# Patient Record
Sex: Female | Born: 1960 | Race: White | Hispanic: No | Marital: Married | Smoking: Never smoker
Health system: Southern US, Community
[De-identification: ages and names within clinical notes are randomized; demographics above are authoritative.]

## PROBLEM LIST (undated history)

## (undated) DIAGNOSIS — E785 Hyperlipidemia, unspecified: Secondary | ICD-10-CM

## (undated) DIAGNOSIS — M8580 Other specified disorders of bone density and structure, unspecified site: Secondary | ICD-10-CM

## (undated) DIAGNOSIS — M1990 Unspecified osteoarthritis, unspecified site: Secondary | ICD-10-CM

## (undated) DIAGNOSIS — F32A Depression, unspecified: Secondary | ICD-10-CM

## (undated) DIAGNOSIS — M21619 Bunion of unspecified foot: Secondary | ICD-10-CM

## (undated) DIAGNOSIS — N952 Postmenopausal atrophic vaginitis: Secondary | ICD-10-CM

## (undated) DIAGNOSIS — C801 Malignant (primary) neoplasm, unspecified: Secondary | ICD-10-CM

## (undated) DIAGNOSIS — U071 COVID-19: Secondary | ICD-10-CM

## (undated) DIAGNOSIS — D518 Other vitamin B12 deficiency anemias: Secondary | ICD-10-CM

## (undated) DIAGNOSIS — I10 Essential (primary) hypertension: Secondary | ICD-10-CM

## (undated) DIAGNOSIS — Z9221 Personal history of antineoplastic chemotherapy: Secondary | ICD-10-CM

## (undated) DIAGNOSIS — C50919 Malignant neoplasm of unspecified site of unspecified female breast: Secondary | ICD-10-CM

## (undated) DIAGNOSIS — Z923 Personal history of irradiation: Secondary | ICD-10-CM

## (undated) DIAGNOSIS — F419 Anxiety disorder, unspecified: Secondary | ICD-10-CM

## (undated) DIAGNOSIS — B029 Zoster without complications: Secondary | ICD-10-CM

## (undated) DIAGNOSIS — F329 Major depressive disorder, single episode, unspecified: Secondary | ICD-10-CM

## (undated) HISTORY — DX: Unspecified osteoarthritis, unspecified site: M19.90

## (undated) HISTORY — DX: Anxiety disorder, unspecified: F41.9

## (undated) HISTORY — PX: TUBAL LIGATION: SHX77

## (undated) HISTORY — DX: Other specified disorders of bone density and structure, unspecified site: M85.80

## (undated) HISTORY — DX: Postmenopausal atrophic vaginitis: N95.2

## (undated) HISTORY — DX: Hyperlipidemia, unspecified: E78.5

## (undated) HISTORY — PX: WISDOM TOOTH EXTRACTION: SHX21

## (undated) HISTORY — DX: Zoster without complications: B02.9

## (undated) HISTORY — PX: OTHER SURGICAL HISTORY: SHX169

## (undated) HISTORY — DX: Essential (primary) hypertension: I10

## (undated) HISTORY — DX: COVID-19: U07.1

## (undated) HISTORY — PX: PORTA CATH INSERTION: CATH118285

## (undated) HISTORY — DX: Bunion of unspecified foot: M21.619

## (undated) HISTORY — PX: ABDOMINAL HYSTERECTOMY: SHX81

## (undated) HISTORY — DX: Malignant (primary) neoplasm, unspecified: C80.1

## (undated) HISTORY — DX: Depression, unspecified: F32.A

## (undated) HISTORY — DX: Other vitamin B12 deficiency anemias: D51.8

## (undated) HISTORY — PX: COLONOSCOPY: SHX174

## (undated) HISTORY — PX: BUNIONECTOMY: SHX129

## (undated) HISTORY — DX: Major depressive disorder, single episode, unspecified: F32.9

---

## 2000-09-24 HISTORY — PX: BREAST BIOPSY: SHX20

## 2004-09-19 ENCOUNTER — Ambulatory Visit: Payer: Self-pay | Admitting: Occupational Therapy

## 2005-02-01 ENCOUNTER — Ambulatory Visit: Payer: Self-pay | Admitting: Occupational Therapy

## 2005-02-20 ENCOUNTER — Ambulatory Visit: Payer: Self-pay | Admitting: Podiatry

## 2005-05-08 ENCOUNTER — Ambulatory Visit: Payer: Self-pay | Admitting: Occupational Therapy

## 2005-08-31 ENCOUNTER — Ambulatory Visit: Payer: Self-pay | Admitting: Podiatry

## 2006-03-11 ENCOUNTER — Ambulatory Visit: Payer: Self-pay | Admitting: Nurse Practitioner

## 2006-08-06 ENCOUNTER — Other Ambulatory Visit: Payer: Self-pay

## 2006-08-12 ENCOUNTER — Ambulatory Visit: Payer: Self-pay | Admitting: Obstetrics and Gynecology

## 2007-04-24 ENCOUNTER — Ambulatory Visit: Payer: Self-pay | Admitting: Nurse Practitioner

## 2008-06-15 ENCOUNTER — Ambulatory Visit: Payer: Self-pay | Admitting: Obstetrics and Gynecology

## 2009-08-04 ENCOUNTER — Ambulatory Visit: Payer: Self-pay | Admitting: Nurse Practitioner

## 2009-09-01 LAB — HM COLONOSCOPY: HM COLON: NORMAL

## 2010-01-09 HISTORY — PX: GANGLION CYST EXCISION: SHX1691

## 2010-08-09 ENCOUNTER — Ambulatory Visit: Payer: Self-pay | Admitting: Internal Medicine

## 2011-06-04 ENCOUNTER — Ambulatory Visit: Payer: Self-pay | Admitting: Internal Medicine

## 2011-06-07 ENCOUNTER — Encounter: Payer: Self-pay | Admitting: Internal Medicine

## 2011-06-07 ENCOUNTER — Ambulatory Visit (INDEPENDENT_AMBULATORY_CARE_PROVIDER_SITE_OTHER): Payer: BC Managed Care – PPO | Admitting: Internal Medicine

## 2011-06-07 VITALS — BP 169/101 | HR 66 | Temp 97.9°F | Resp 12 | Ht 65.0 in | Wt 156.0 lb

## 2011-06-07 DIAGNOSIS — E538 Deficiency of other specified B group vitamins: Secondary | ICD-10-CM | POA: Insufficient documentation

## 2011-06-07 DIAGNOSIS — F418 Other specified anxiety disorders: Secondary | ICD-10-CM | POA: Insufficient documentation

## 2011-06-07 DIAGNOSIS — F3289 Other specified depressive episodes: Secondary | ICD-10-CM

## 2011-06-07 DIAGNOSIS — F329 Major depressive disorder, single episode, unspecified: Secondary | ICD-10-CM

## 2011-06-07 DIAGNOSIS — F39 Unspecified mood [affective] disorder: Secondary | ICD-10-CM | POA: Insufficient documentation

## 2011-06-07 DIAGNOSIS — I10 Essential (primary) hypertension: Secondary | ICD-10-CM | POA: Insufficient documentation

## 2011-06-07 DIAGNOSIS — F32A Depression, unspecified: Secondary | ICD-10-CM

## 2011-06-07 MED ORDER — CYANOCOBALAMIN 1000 MCG/ML IJ SOLN: 1000.0000 ug | INTRAMUSCULAR | Status: DC

## 2011-06-07 MED ORDER — HYDROCHLOROTHIAZIDE 25 MG PO TABS: 25.0000 mg | ORAL_TABLET | Freq: Every day | ORAL | Status: DC

## 2011-06-07 MED ORDER — VENLAFAXINE HCL ER 37.5 MG PO CP24: 37.5000 mg | ORAL_CAPSULE | Freq: Every day | ORAL | Status: DC

## 2011-06-07 NOTE — Progress Notes (Signed)
Subjective:    Patient ID: Danielle Sparks, female    DOB: Apr 22, 1961, 50 y.o.   MRN: [id number]  HPI Danielle Sparks is a 50 year old female with a history of depression and hypertension who presents for followup. In regards to her depression, Danielle Sparks reports her mood has been much better since starting on Effexor. Danielle Sparks also reports significantly improved sleep after starting on Premarin to help with her hot flashes. Danielle Sparks reports Danielle Sparks feels the best that Danielle Sparks has felt in years. Danielle Sparks has been active dissipating in activities with friends. Danielle Sparks reports her stress level at work has improved. Danielle Sparks denies any complications from her medications.  In regards to her hypertension, Danielle Sparks reports her blood pressure has been well-controlled. 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 Danielle Sparks is recently been out of her blood pressure medication and did not take her medication today. Danielle Sparks denies any chest pain, palpitations, headache, edema, or other symptoms.  Outpatient Encounter Prescriptions as of 06/07/2011  Medication Sig Dispense Refill  . cyanocobalamin (,VITAMIN B-12,) 1000 MCG/ML injection Inject 1 mL (1,000 mcg total) into the muscle every 30 (thirty) days.  1 mL  11  . hydrochlorothiazide (HYDRODIURIL) 25 MG tablet Take 1 tablet (25 mg total) by mouth daily.  90 tablet  4  . PREMARIN 0.625 MG tablet       . venlafaxine (EFFEXOR-XR) 37.5 MG 24 hr capsule Take 1 capsule (37.5 mg total) by mouth daily.  30 capsule  11     Review of Systems  Constitutional: Negative for fever, chills, appetite change, fatigue and unexpected weight change.  Respiratory: Negative for shortness of breath.   Cardiovascular: Negative for chest pain, palpitations and leg swelling.  Musculoskeletal: Negative for myalgias, arthralgias and gait problem.  Skin: Negative for color change.  Psychiatric/Behavioral: Negative for suicidal ideas, sleep disturbance and dysphoric mood. The patient is not nervous/anxious.      BP 169/101  Pulse 66  Temp(Src) 97.9 F (36.6 C) (Oral)  Resp 12  Ht 5\' 5"  (1.651 m)  Wt 156 lb (70.761 kg)  BMI 25.96 kg/m2  SpO2 98%    Objective:   Physical Exam  Constitutional: Danielle Sparks is oriented to person, place, and time. Danielle Sparks appears well-developed and well-nourished. No distress.  HENT:  Head: Normocephalic and atraumatic.  Right Ear: External ear normal.  Left Ear: External ear normal.  Nose: Nose normal.  Mouth/Throat: Oropharynx is clear and moist. No oropharyngeal exudate.  Eyes: Conjunctivae are normal. Pupils are equal, round, and reactive to light. Right eye exhibits no discharge. Left eye exhibits no discharge. No scleral icterus.  Neck: Normal range of motion. Neck supple. No tracheal deviation present. No thyromegaly present.  Cardiovascular: Normal rate, regular rhythm, normal heart sounds and intact distal pulses.  Exam reveals no gallop and no friction rub.   No murmur heard. Pulmonary/Chest: Effort normal and breath sounds normal. No respiratory distress. Danielle Sparks has no wheezes. Danielle Sparks has no rales. Danielle Sparks exhibits no tenderness.  Musculoskeletal: Normal range of motion. Danielle Sparks exhibits no edema and no tenderness.  Lymphadenopathy:    Danielle Sparks has no cervical adenopathy.  Neurological: Danielle Sparks is alert and oriented to person, place, and time. No cranial nerve deficit. Danielle Sparks exhibits normal muscle tone. Coordination normal.  Skin: Skin is warm and dry. No rash noted. Danielle Sparks is not diaphoretic. No erythema. No pallor.  Psychiatric: Danielle Sparks has a normal mood and affect. Her behavior is normal. Judgment and thought content normal. Cognition and memory are  normal.          Assessment & Plan:  1. Depression - patient with depression. Symptoms currently very well controlled on Effexor. We'll plan to continue this medication. I suspect some of her improvement is secondary to improve sleep after starting Premarin as this helps significantly with hot flashes. Danielle Sparks will plan to followup in 3 months  for her annual physical.  2. Hypertension - patient with hypertension which has historically been well controlled. Blood pressure is elevated today, however, Danielle Sparks has not taken her medication. We will refill her hydrochlorothiazide. Danielle Sparks will monitor her blood pressure at her pharmacy and call if blood pressure vomiting greater than 140/90. Otherwise Danielle Sparks will return to clinic in 3 months for physical exam.

## 2011-06-07 NOTE — Patient Instructions (Signed)
Continue current medications. Physical exam in January.

## 2011-07-05 ENCOUNTER — Encounter: Payer: Self-pay | Admitting: Internal Medicine

## 2011-07-13 ENCOUNTER — Ambulatory Visit: Payer: BC Managed Care – PPO

## 2011-07-16 ENCOUNTER — Telehealth: Payer: Self-pay | Admitting: Internal Medicine

## 2011-07-16 MED ORDER — PHENTERMINE HCL 37.5 MG PO CAPS: 37.5000 mg | ORAL_CAPSULE | ORAL | Status: DC

## 2011-07-16 NOTE — Telephone Encounter (Signed)
Yes, she can start Phentermine 37.5mg  daily disp 30 with 2 refill

## 2011-07-16 NOTE — Telephone Encounter (Signed)
Patient called and stated she came by Friday and I took her vitals and she is wondering about starting the Phentermine again.  Please advise.

## 2011-07-16 NOTE — Telephone Encounter (Signed)
Called in RX. Left vm for pt to check w/her pharm

## 2011-08-14 ENCOUNTER — Telehealth: Payer: Self-pay | Admitting: *Deleted

## 2011-08-14 MED ORDER — ESTROGENS CONJUGATED 0.625 MG PO TABS: 0.6250 mg | ORAL_TABLET | Freq: Every day | ORAL | Status: DC

## 2011-08-14 NOTE — Telephone Encounter (Signed)
Done

## 2011-09-19 ENCOUNTER — Ambulatory Visit: Payer: Self-pay | Admitting: Internal Medicine

## 2011-10-09 ENCOUNTER — Ambulatory Visit: Payer: BC Managed Care – PPO | Admitting: Internal Medicine

## 2011-11-19 ENCOUNTER — Other Ambulatory Visit: Payer: Self-pay | Admitting: *Deleted

## 2011-11-19 MED ORDER — ESTROGENS CONJUGATED 0.625 MG PO TABS: 0.6250 mg | ORAL_TABLET | Freq: Every day | ORAL | Status: DC

## 2011-11-28 ENCOUNTER — Encounter: Payer: Self-pay | Admitting: Internal Medicine

## 2012-02-08 ENCOUNTER — Telehealth: Payer: Self-pay | Admitting: Internal Medicine

## 2012-02-08 MED ORDER — PHENTERMINE HCL 37.5 MG PO CAPS: 37.5000 mg | ORAL_CAPSULE | ORAL | Status: DC

## 2012-02-08 NOTE — Telephone Encounter (Signed)
Call [phone] Pt has sent refill for thentermine 37.5mg  She will fax this again Please advise pt Pt is complete out of meds

## 2012-02-08 NOTE — Telephone Encounter (Signed)
We can call in 1 month refill, but she needs 1 month follow up.

## 2012-02-08 NOTE — Telephone Encounter (Signed)
rx sent to pharmacy

## 2012-02-08 NOTE — Telephone Encounter (Signed)
Phentermine request to [facility] [last refill 10.22.12 #[id number]  Last OV 09.13.12]/SLS Please advise.

## 2012-02-26 ENCOUNTER — Encounter: Payer: Self-pay | Admitting: Internal Medicine

## 2012-02-26 ENCOUNTER — Ambulatory Visit (INDEPENDENT_AMBULATORY_CARE_PROVIDER_SITE_OTHER): Payer: BC Managed Care – PPO | Admitting: Internal Medicine

## 2012-02-26 VITALS — BP 130/98 | HR 65 | Temp 98.7°F | Ht 65.0 in | Wt 157.2 lb

## 2012-02-26 DIAGNOSIS — F32A Depression, unspecified: Secondary | ICD-10-CM

## 2012-02-26 DIAGNOSIS — I10 Essential (primary) hypertension: Secondary | ICD-10-CM

## 2012-02-26 DIAGNOSIS — F3289 Other specified depressive episodes: Secondary | ICD-10-CM

## 2012-02-26 DIAGNOSIS — F329 Major depressive disorder, single episode, unspecified: Secondary | ICD-10-CM

## 2012-02-26 MED ORDER — VENLAFAXINE HCL ER 75 MG PO CP24: 75.0000 mg | ORAL_CAPSULE | Freq: Every day | ORAL | Status: DC

## 2012-02-26 NOTE — Assessment & Plan Note (Signed)
Symptoms have recently worsened significantly with increased stressors at home and work. Will increase Effexor to 75 mg daily. We'll also set up counseling with psychology. Patient will call or e-mail with update later this week. She will followup in 2 weeks.

## 2012-02-26 NOTE — Assessment & Plan Note (Signed)
Blood pressure slightly elevated today, however suspect this is related to increased anxiety. Will recheck at visit in 2 weeks.
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
## 2012-02-27 ENCOUNTER — Encounter: Payer: Self-pay | Admitting: Internal Medicine

## 2012-02-27 ENCOUNTER — Telehealth: Payer: Self-pay | Admitting: Internal Medicine

## 2012-02-27 MED ORDER — ESTROGENS CONJUGATED 0.625 MG PO TABS: 0.6250 mg | ORAL_TABLET | Freq: Every day | ORAL | Status: DC

## 2012-02-27 NOTE — Telephone Encounter (Signed)
Message copied by [employer] on Wed Feb 27, 2012 12:51 PM ------      Message from: Ronna Polio A      Created: Tue Feb 26, 2012  1:50 PM       Call Silverio Lay for followup

## 2012-02-27 NOTE — Telephone Encounter (Signed)
Left message on phone to check on pt.

## 2012-03-03 ENCOUNTER — Encounter: Payer: Self-pay | Admitting: Internal Medicine

## 2012-03-11 ENCOUNTER — Telehealth: Payer: Self-pay | Admitting: Internal Medicine

## 2012-03-11 NOTE — Telephone Encounter (Signed)
Error pt will come in on Thursday 6/20

## 2012-03-12 ENCOUNTER — Ambulatory Visit: Payer: BC Managed Care – PPO | Admitting: Internal Medicine

## 2012-03-12 ENCOUNTER — Encounter: Payer: Self-pay | Admitting: Internal Medicine

## 2012-03-13 ENCOUNTER — Encounter: Payer: Self-pay | Admitting: Internal Medicine

## 2012-03-13 ENCOUNTER — Ambulatory Visit (INDEPENDENT_AMBULATORY_CARE_PROVIDER_SITE_OTHER): Payer: BC Managed Care – PPO | Admitting: Internal Medicine

## 2012-03-13 VITALS — BP 120/80 | HR 66 | Temp 98.5°F | Ht 65.0 in | Wt 161.0 lb

## 2012-03-13 DIAGNOSIS — F3289 Other specified depressive episodes: Secondary | ICD-10-CM

## 2012-03-13 DIAGNOSIS — F32A Depression, unspecified: Secondary | ICD-10-CM

## 2012-03-13 DIAGNOSIS — F329 Major depressive disorder, single episode, unspecified: Secondary | ICD-10-CM

## 2012-03-13 MED ORDER — VENLAFAXINE HCL ER 150 MG PO CP24: 150.0000 mg | ORAL_CAPSULE | Freq: Every day | ORAL | Status: DC

## 2012-03-13 NOTE — Assessment & Plan Note (Signed)
Significant improvement in symptoms with use of Effexor and counseling. Will increase dose of Effexor to 150 mg daily. We'll continue with counseling. Patient will followup in one month.

## 2012-03-13 NOTE — Progress Notes (Signed)
  Subjective:    Patient ID: Danielle Sparks, female    DOB: [birth year], 51 y.o.   MRN: [id number]  HPI 51 year old female with history of depression, hypertension presents for followup. At her last visit, she had significant worsening of her depression and was started on Effexor. She was also referred for counseling. She reports significant improvement in her symptoms of depressed mood and anxiety since starting medication and starting counseling. She is currently undergoing counseling a weekly basis. She denies any side effects noted from her medication. She continues to have some occasional episodes of severe anxiety or depressed mood. She notes, however, that she has been making effort to be more active and has restarted an exercise program. She has also been spending time with friends and family.  Outpatient Encounter Prescriptions as of 03/13/2012  Medication Sig Dispense Refill  . cyanocobalamin (,VITAMIN B-12,) 1000 MCG/ML injection Inject 1 mL (1,000 mcg total) into the muscle every 30 (thirty) days.  1 mL  11  . estrogens, conjugated, (PREMARIN) 0.625 MG tablet Take 1 tablet (0.625 mg total) by mouth daily.  90 tablet  3  . hydrochlorothiazide (HYDRODIURIL) 25 MG tablet Take 1 tablet (25 mg total) by mouth daily.  90 tablet  4  . phentermine 37.5 MG capsule Take 1 capsule (37.5 mg total) by mouth every morning.  30 capsule  2  . venlafaxine XR (EFFEXOR-XR) 150 MG 24 hr capsule Take 1 capsule (150 mg total) by mouth daily.  30 capsule  11  . DISCONTD: venlafaxine XR (EFFEXOR-XR) 75 MG 24 hr capsule Take 1 capsule (75 mg total) by mouth daily.  30 capsule  11    Review of Systems  Constitutional: Negative for fever, chills and fatigue.  Psychiatric/Behavioral: Positive for dysphoric mood. Negative for suicidal ideas, hallucinations, disturbed wake/sleep cycle and self-injury. The patient is nervous/anxious.    BP 120/80  Pulse 66  Temp 98.5 F (36.9 C) (Oral)  Ht 5\' 5"  (1.651 m)  Wt  161 lb (73.029 kg)  BMI 26.79 kg/m2  SpO2 98%     Objective:   Physical Exam  Constitutional: She is oriented to person, place, and time. She appears well-developed and well-nourished. No distress.  HENT:  Head: Normocephalic and atraumatic.  Eyes: Pupils are equal, round, and reactive to light.  Neck: Normal range of motion. Neck supple.  Pulmonary/Chest: Effort normal.  Neurological: She is alert and oriented to person, place, and time.  Skin: Skin is warm and dry. She is not diaphoretic.  Psychiatric: She has a normal mood and affect. Her speech is normal and behavior is normal. Thought content normal. Cognition and memory are normal.          Assessment & Plan:

## 2012-04-02 ENCOUNTER — Encounter: Payer: BC Managed Care – PPO | Admitting: Internal Medicine

## 2012-05-02 LAB — HM PAP SMEAR: HM Pap smear: NORMAL

## 2012-05-06 ENCOUNTER — Other Ambulatory Visit (INDEPENDENT_AMBULATORY_CARE_PROVIDER_SITE_OTHER): Payer: BC Managed Care – PPO | Admitting: *Deleted

## 2012-05-06 ENCOUNTER — Telehealth: Payer: Self-pay | Admitting: *Deleted

## 2012-05-06 DIAGNOSIS — Z0000 Encounter for general adult medical examination without abnormal findings: Secondary | ICD-10-CM

## 2012-05-06 LAB — CBC WITH DIFFERENTIAL/PLATELET
Eosinophils Relative: 3 % (ref 0.0–5.0)
HCT: 41.3 % (ref 36.0–46.0)
Hemoglobin: 13.7 g/dL (ref 12.0–15.0)
Lymphs Abs: 1.5 10*3/uL (ref 0.7–4.0)
Monocytes Relative: 7.8 % (ref 3.0–12.0)
Neutro Abs: 2.3 10*3/uL (ref 1.4–7.7)
RDW: 13.2 % (ref 11.5–14.6)
WBC: 4.2 10*3/uL — ABNORMAL LOW (ref 4.5–10.5)

## 2012-05-06 LAB — COMPREHENSIVE METABOLIC PANEL
BUN: 13 mg/dL (ref 6–23)
CO2: 27 mEq/L (ref 19–32)
Creatinine, Ser: 0.6 mg/dL (ref 0.4–1.2)
GFR: 106.94 mL/min (ref >60.00)
Glucose, Bld: 87 mg/dL (ref 70–99)
Total Bilirubin: 0.6 mg/dL (ref 0.3–1.2)

## 2012-05-06 LAB — LIPID PANEL
Cholesterol: 267 mg/dL — ABNORMAL HIGH (ref 0–200)
HDL: 76.6 mg/dL (ref >39.00)
VLDL: 19.4 mg/dL (ref 0.0–40.0)

## 2012-05-06 NOTE — Telephone Encounter (Signed)
Patient came in for labs today , what would you like for me to order? 

## 2012-05-06 NOTE — Telephone Encounter (Signed)
CBC, CMP, lipids, TSH, A1c, Vit D 

## 2012-05-06 NOTE — Telephone Encounter (Signed)
Labs ordered.

## 2012-05-07 LAB — VITAMIN D 25 HYDROXY (VIT D DEFICIENCY, FRACTURES): Vit D, 25-Hydroxy: 42 ng/mL (ref 30–89)

## 2012-05-13 ENCOUNTER — Ambulatory Visit (INDEPENDENT_AMBULATORY_CARE_PROVIDER_SITE_OTHER): Payer: BC Managed Care – PPO | Admitting: Internal Medicine

## 2012-05-13 ENCOUNTER — Encounter: Payer: Self-pay | Admitting: Internal Medicine

## 2012-05-13 ENCOUNTER — Other Ambulatory Visit (HOSPITAL_COMMUNITY)
Admission: RE | Admit: 2012-05-13 | Discharge: 2012-05-13 | Disposition: A | Discharge status: Home / Self Care | Payer: BC Managed Care – PPO | Source: Ambulatory Visit | Attending: Internal Medicine | Admitting: Internal Medicine

## 2012-05-13 VITALS — BP 130/94 | HR 65 | Temp 98.2°F | Ht 65.0 in | Wt 165.0 lb

## 2012-05-13 DIAGNOSIS — E785 Hyperlipidemia, unspecified: Secondary | ICD-10-CM

## 2012-05-13 DIAGNOSIS — F3289 Other specified depressive episodes: Secondary | ICD-10-CM

## 2012-05-13 DIAGNOSIS — D72819 Decreased white blood cell count, unspecified: Secondary | ICD-10-CM | POA: Insufficient documentation

## 2012-05-13 DIAGNOSIS — N959 Unspecified menopausal and perimenopausal disorder: Secondary | ICD-10-CM

## 2012-05-13 DIAGNOSIS — F32A Depression, unspecified: Secondary | ICD-10-CM

## 2012-05-13 DIAGNOSIS — Z1151 Encounter for screening for human papillomavirus (HPV): Secondary | ICD-10-CM | POA: Insufficient documentation

## 2012-05-13 DIAGNOSIS — E7849 Other hyperlipidemia: Secondary | ICD-10-CM | POA: Insufficient documentation

## 2012-05-13 DIAGNOSIS — Z01419 Encounter for gynecological examination (general) (routine) without abnormal findings: Secondary | ICD-10-CM | POA: Insufficient documentation

## 2012-05-13 DIAGNOSIS — Z23 Encounter for immunization: Secondary | ICD-10-CM

## 2012-05-13 DIAGNOSIS — F329 Major depressive disorder, single episode, unspecified: Secondary | ICD-10-CM

## 2012-05-13 DIAGNOSIS — Z1211 Encounter for screening for malignant neoplasm of colon: Secondary | ICD-10-CM

## 2012-05-13 DIAGNOSIS — Z0000 Encounter for general adult medical examination without abnormal findings: Secondary | ICD-10-CM

## 2012-05-13 HISTORY — DX: Decreased white blood cell count, unspecified: D72.819

## 2012-05-13 MED ORDER — ESTROGENS CONJUGATED 0.45 MG PO TABS: 0.4500 mg | ORAL_TABLET | Freq: Every day | ORAL | Status: DC

## 2012-05-13 NOTE — Assessment & Plan Note (Signed)
LDL elevated on recent labs. Discussed limiting intake of saturated fat and processed carbohydrates. Discussed increasing intake of fiber. And increasing physical activity. Will plan to repeat lipids in 3 months. If no improvement, will add statin.

## 2012-05-13 NOTE — Assessment & Plan Note (Signed)
Mild leukopenia noted on recent labs. We'll plan to repeat CBC in 3 months.

## 2012-05-13 NOTE — Assessment & Plan Note (Signed)
Symptoms markedly improved with counseling and Effexor. Will continue.

## 2012-05-13 NOTE — Progress Notes (Signed)
Subjective:    Patient ID: Danielle Sparks, female    DOB: 03-21-1961, 51 y.o.   MRN: [id number]  HPI 51 year old female with history of hypertension and depression/anxiety presents for a annual exam. She reports she is doing very well. She reports that counseling has significantly improved her anxiety and depressed mood. She also is taking Effexor and reports this is working well for her.  We reviewed recent labs which showed elevated cholesterol with LDL of 186. She has been noted to have elevated cholesterol the past but would prefer not to use medication for this. She reports some dietary indiscretion recently. She does exercise on a regular basis.  In regards to general health maintenance, she is due for colonoscopy. She is also due for Pap smear. She has not had a Pap smear since her hysterectomy. She reports that hysterectomy was performed for uterine fibroids. She has never had abnormal Paps in the past. She is also due for TDAP Vaccine. She notes that her daughter is pregnant she would like to have this vaccine prior to arrival of her grandchild.  Outpatient Encounter Prescriptions as of 05/13/2012  Medication Sig Dispense Refill  . cyanocobalamin (,VITAMIN B-12,) 1000 MCG/ML injection Inject 1 mL (1,000 mcg total) into the muscle every 30 (thirty) days.  1 mL  11  . estrogens, conjugated, (PREMARIN) 0.45 MG tablet Take 1 tablet (0.45 mg total) by mouth daily.  90 tablet  3  . hydrochlorothiazide (HYDRODIURIL) 25 MG tablet Take 1 tablet (25 mg total) by mouth daily.  90 tablet  4  . venlafaxine XR (EFFEXOR-XR) 150 MG 24 hr capsule Take 1 capsule (150 mg total) by mouth daily.  30 capsule  11   BP 130/94  Pulse 65  Temp 98.2 F (36.8 C) (Oral)  Ht 5\' 5"  (1.651 m)  Wt 165 lb (74.844 kg)  BMI 27.46 kg/m2  SpO2 97%  Review of Systems  Constitutional: Negative for fever, chills, appetite change, fatigue and unexpected weight change.  HENT: Negative for ear pain, congestion, sore  throat, trouble swallowing, neck pain, voice change and sinus pressure.   Eyes: Negative for visual disturbance.  Respiratory: Negative for cough, shortness of breath, wheezing and stridor.   Cardiovascular: Negative for chest pain, palpitations and leg swelling.  Gastrointestinal: Negative for nausea, vomiting, abdominal pain, diarrhea, constipation, blood in stool, abdominal distention and anal bleeding.  Genitourinary: Negative for dysuria and flank pain.  Musculoskeletal: Negative for myalgias, arthralgias and gait problem.  Skin: Negative for color change and rash.  Neurological: Negative for dizziness and headaches.  Hematological: Negative for adenopathy. Does not bruise/bleed easily.  Psychiatric/Behavioral: Negative for suicidal ideas, disturbed wake/sleep cycle and dysphoric mood. The patient is nervous/anxious.        Objective:   Physical Exam  Constitutional: She is oriented to person, place, and time. She appears well-developed and well-nourished. No distress.  HENT:  Head: Normocephalic and atraumatic.  Right Ear: External ear normal.  Left Ear: External ear normal.  Nose: Nose normal.  Mouth/Throat: Oropharynx is clear and moist. No oropharyngeal exudate.  Eyes: Conjunctivae are normal. Pupils are equal, round, and reactive to light. Right eye exhibits no discharge. Left eye exhibits no discharge. No scleral icterus.  Neck: Normal range of motion. Neck supple. No tracheal deviation present. No thyromegaly present.  Cardiovascular: Normal rate, regular rhythm, normal heart sounds and intact distal pulses.  Exam reveals no gallop and no friction rub.   No murmur heard. Pulmonary/Chest: Effort normal and breath  sounds normal. No respiratory distress. She has no wheezes. She has no rales. She exhibits no tenderness. Right breast exhibits no inverted nipple, no mass, no nipple discharge, no skin change and no tenderness. Left breast exhibits no inverted nipple, no mass, no  nipple discharge, no skin change and no tenderness. Breasts are symmetrical.  Abdominal: Soft. Bowel sounds are normal. She exhibits no distension and no mass. There is no tenderness. There is no rebound and no guarding.  Genitourinary: Rectum normal and vagina normal. No breast swelling, tenderness, discharge or bleeding. Pelvic exam was performed with patient supine. There is no rash, tenderness or lesion on the right labia. There is no rash, tenderness or lesion on the left labia. Right adnexum displays no mass, no tenderness and no fullness. Left adnexum displays no mass, no tenderness and no fullness. No erythema or tenderness around the vagina. No vaginal discharge found.       Cervix and uterus surgically absent  Musculoskeletal: Normal range of motion. She exhibits no edema and no tenderness.  Lymphadenopathy:    She has no cervical adenopathy.  Neurological: She is alert and oriented to person, place, and time. No cranial nerve deficit. She exhibits normal muscle tone. Coordination normal.  Skin: Skin is warm and dry. No rash noted. She is not diaphoretic. No erythema. No pallor.  Psychiatric: She has a normal mood and affect. Her behavior is normal. Judgment and thought content normal.          Assessment & Plan:

## 2012-05-13 NOTE — Assessment & Plan Note (Signed)
General medical exam including breast and pelvic exam normal today. Pap is pending. Tetanus and pertussis vaccine was given. Reviewed recent lab work which was normal except for elevated cholesterol. Encourage patient to continue efforts at healthy diet and regular physical activity. Will schedule screening colonoscopy.

## 2012-05-13 NOTE — Assessment & Plan Note (Signed)
Patient is interested in weaning down on Premarin. Will decrease dose to 0.45 mg daily. Followup in 3 months.

## 2012-05-13 NOTE — Assessment & Plan Note (Signed)
>>  ASSESSMENT AND PLAN FOR HYPERLIPIDEMIA WITH TARGET LOW DENSITY LIPOPROTEIN (LDL) CHOLESTEROL LESS THAN 100 MG/DL WRITTEN ON 1/79/7986 87:51 PM BY WALKER, JENNIFER A, MD  LDL elevated on recent labs. Discussed limiting intake of saturated fat and processed carbohydrates. Discussed increasing intake of fiber. And increasing physical activity. Will plan to repeat lipids in 3 months. If no improvement, will add statin.

## 2012-05-14 ENCOUNTER — Encounter: Payer: Self-pay | Admitting: Internal Medicine

## 2012-06-16 ENCOUNTER — Encounter: Payer: BC Managed Care – PPO | Admitting: Internal Medicine

## 2012-06-24 ENCOUNTER — Ambulatory Visit (AMBULATORY_SURGERY_CENTER): Payer: BC Managed Care – PPO | Admitting: *Deleted

## 2012-06-24 VITALS — Ht 65.5 in | Wt 170.5 lb

## 2012-06-24 DIAGNOSIS — Z1211 Encounter for screening for malignant neoplasm of colon: Secondary | ICD-10-CM

## 2012-06-24 MED ORDER — NA SULFATE-K SULFATE-MG SULF 17.5-3.13-1.6 GM/177ML PO SOLN: 1.0000 | Freq: Once | ORAL | Status: DC

## 2012-06-25 ENCOUNTER — Encounter: Payer: Self-pay | Admitting: Internal Medicine

## 2012-07-03 ENCOUNTER — Encounter: Payer: Self-pay | Admitting: Internal Medicine

## 2012-07-03 ENCOUNTER — Ambulatory Visit (AMBULATORY_SURGERY_CENTER): Payer: BC Managed Care – PPO | Admitting: Internal Medicine

## 2012-07-03 VITALS — BP 115/103 | HR 61 | Temp 97.2°F | Resp 15 | Ht 65.0 in | Wt 170.0 lb

## 2012-07-03 DIAGNOSIS — D126 Benign neoplasm of colon, unspecified: Secondary | ICD-10-CM

## 2012-07-03 DIAGNOSIS — D129 Benign neoplasm of anus and anal canal: Secondary | ICD-10-CM

## 2012-07-03 DIAGNOSIS — D128 Benign neoplasm of rectum: Secondary | ICD-10-CM

## 2012-07-03 LAB — HM COLONOSCOPY: HM Colonoscopy: NORMAL

## 2012-07-03 NOTE — Patient Instructions (Addendum)

## 2012-07-03 NOTE — Op Note (Signed)
[facility] [address] [place], [address]   COLONOSCOPY PROCEDURE REPORT  PATIENT: Danielle Sparks, Danielle Sparks  MR#: [id number] BIRTHDATE: 01/22/1961 , 51  yrs. old GENDER: Female ENDOSCOPIST: Beverley Fiedler, MD REFERRED [id number], Jennifer PROCEDURE DATE:  07/03/2012 PROCEDURE:   Colonoscopy with cold biopsy polypectomy ASA CLASS:   Class I INDICATIONS:average risk screening and first colonoscopy. MEDICATIONS: MAC sedation, administered by CRNA and propofol (Diprivan) 250mg  IV  DESCRIPTION OF PROCEDURE:   After the risks benefits and alternatives of the procedure were thoroughly explained, informed consent was obtained.  A digital rectal exam revealed no rectal mass.   The LB [id number]  endoscope was introduced through the anus and advanced to the cecum, which was identified by both the appendix and ileocecal valve. No adverse events experienced. The quality of the prep was Suprep good  The instrument was then slowly withdrawn as the colon was fully examined.    COLON FINDINGS: Mild diverticulosis was noted in the sigmoid colon. A diminutive sessile polyp, measuring 2 mm in size, was found in the rectum.  A polypectomy was performed with cold forceps.  The resection was complete and the polyp tissue was completely retrieved.  Retroflexed views revealed no abnormalities. The time to cecum=4 minutes 38 seconds.  Withdrawal time=10 minutes 00 seconds.  The scope was withdrawn and the procedure completed.  COMPLICATIONS: There were no complications.  ENDOSCOPIC IMPRESSION: 1.   Mild diverticulosis was noted in the sigmoid colon 2.   Diminutive sessile polyp, measuring 2 mm in size, was found in the rectum; polypectomy was performed with cold forceps  RECOMMENDATIONS: 1.  Await pathology results 2.  High fiber diet 3.  If the polyp removed today is proven to be an adenomatous (pre-cancerous) polyp, you will need a repeat colonoscopy in 5 years.  Otherwise  you should continue to follow colorectal cancer screening guidelines for "routine risk" patients with colonoscopy in 10 years.  You will receive a letter within 1-2 weeks with the results of your biopsy as well as final recommendations.  Please call my office if you have not received a letter after 3 weeks.   eSigned:  Beverley Fiedler, MD 07/03/2012 12:35 PM   cc: The Patient and Ronna Polio MD

## 2012-07-04 ENCOUNTER — Telehealth: Payer: Self-pay

## 2012-07-04 NOTE — Telephone Encounter (Signed)
Left message on answering machine. 

## 2012-07-09 ENCOUNTER — Encounter: Payer: Self-pay | Admitting: Internal Medicine

## 2012-08-08 ENCOUNTER — Other Ambulatory Visit: Payer: BC Managed Care – PPO

## 2012-08-13 ENCOUNTER — Ambulatory Visit: Payer: BC Managed Care – PPO | Admitting: Internal Medicine

## 2012-08-13 ENCOUNTER — Other Ambulatory Visit: Payer: Self-pay | Admitting: Internal Medicine

## 2012-08-14 ENCOUNTER — Encounter: Payer: Self-pay | Admitting: General Practice

## 2012-08-14 ENCOUNTER — Telehealth: Payer: Self-pay | Admitting: General Practice

## 2012-08-14 DIAGNOSIS — E785 Hyperlipidemia, unspecified: Secondary | ICD-10-CM

## 2012-08-14 MED ORDER — PHENTERMINE HCL 37.5 MG PO CAPS: 37.5000 mg | ORAL_CAPSULE | ORAL | Status: DC

## 2012-08-14 NOTE — Telephone Encounter (Signed)
Phentermine refill request. Last filled on 02/08/12 qty 30 with 2 refills. Pt last seen on 8/20. Ok to refill?

## 2012-08-14 NOTE — Telephone Encounter (Signed)
Pt called in regards to recent medication denial. Tried to send a myChart message but for some reason her account has been deactivated.  Cholesterol high 3 mos ago pt asked you for 3 mos to get this in order to see if she could get levels down on her own. Pt could not come for blood work because of an employee problem at work, that is why she did not come for appt yesterday. Pt had one refill left of Phentermine. Pt has lost 15 lbs. Started taking 1/2 of one at in the morning and 1/2 of one at lunch time. Bp is doing well also.Pt states she has energy. Could she continue on it through the holidays. Seeing Dr. Latina Craver every 6 weeks.

## 2012-08-14 NOTE — Telephone Encounter (Signed)
Med filled.  

## 2012-08-14 NOTE — Telephone Encounter (Signed)
We can give 1 month supply of Phentermine with no refills.  FDA approves phentermine for use for 3 months only. She will need to have follow up in 1-2 months to recheck BP on medication.

## 2012-09-01 ENCOUNTER — Other Ambulatory Visit: Payer: Self-pay | Admitting: Internal Medicine

## 2012-09-01 ENCOUNTER — Ambulatory Visit: Payer: BC Managed Care – PPO | Admitting: Internal Medicine

## 2012-09-01 NOTE — Telephone Encounter (Signed)
Med filled.  

## 2012-09-03 ENCOUNTER — Ambulatory Visit: Payer: BC Managed Care – PPO | Admitting: Internal Medicine

## 2012-09-24 DIAGNOSIS — C50919 Malignant neoplasm of unspecified site of unspecified female breast: Secondary | ICD-10-CM

## 2012-09-24 HISTORY — DX: Malignant neoplasm of unspecified site of unspecified female breast: C50.919

## 2012-09-24 HISTORY — PX: BREAST EXCISIONAL BIOPSY: SUR124

## 2012-10-02 ENCOUNTER — Ambulatory Visit: Payer: Self-pay | Admitting: Internal Medicine

## 2012-10-03 ENCOUNTER — Telehealth: Payer: Self-pay | Admitting: Internal Medicine

## 2012-10-03 NOTE — Telephone Encounter (Signed)
Pt mammogram was BIRADS 3, recommending earlier follow up. Typically this is 6 months, however they did not say on the repeat. Have they contacted pt to schedule repeat mammogram?

## 2012-10-08 ENCOUNTER — Telehealth: Payer: Self-pay | Admitting: Internal Medicine

## 2012-10-08 NOTE — Telephone Encounter (Signed)
I have received a mammogram report which says BIRADS 3 interval follow up needed, but does not specify amount of time, then at the end of the report it says BIRADS0, needs additional imaging?? Can you ask pt if the radiologist talked to her about this? Did they set up follow up?

## 2012-10-09 NOTE — Telephone Encounter (Signed)
See phone note from 10/08/2012, patient returns for additional views on Monday.

## 2012-10-09 NOTE — Telephone Encounter (Signed)
Spoke with patient and she stated she returns to the breast center on Monday for additional testing.

## 2012-10-14 ENCOUNTER — Encounter: Payer: Self-pay | Admitting: Internal Medicine

## 2012-10-15 ENCOUNTER — Ambulatory Visit: Payer: Self-pay | Admitting: Internal Medicine

## 2012-10-17 ENCOUNTER — Telehealth: Payer: Self-pay | Admitting: Internal Medicine

## 2012-10-17 DIAGNOSIS — R928 Other abnormal and inconclusive findings on diagnostic imaging of breast: Secondary | ICD-10-CM

## 2012-10-17 NOTE — Telephone Encounter (Signed)
I have asked Amber to go ahead and set up the surgical appointment and wait for Dr. Dan Humphreys to advise her that patient is aware that she will call.

## 2012-10-17 NOTE — Telephone Encounter (Signed)
I have received ultrasound report only. Based on this report, there is a subcentimeter nodule in the right breast. I would recommend surgical evaluation with Dr. Lemar Livings.

## 2012-10-17 NOTE — Telephone Encounter (Signed)
I have laid report at your work station.

## 2012-10-17 NOTE — Telephone Encounter (Signed)
Patient called back up at [facility] and she stated that the radiologist called her and said she needed to be set up with a surgeon.  She trust your opinion of who that surgeon should be and will wait to hear from [place]. She did say that they should fax the results over within the hour.

## 2012-10-17 NOTE — Telephone Encounter (Signed)
Patient called back and said that she spoke with [facility] and they stated that they haven't read the results yet.   She was a little upset with them and wanted to know who she should contact since they made it seem like it was urgent for the more images and now the results are sitting on someone's desk waiting to be read.  I advised her to call over and speak with their office manager.  She did say that they should have the results sent here before noon today.

## 2012-10-17 NOTE — Telephone Encounter (Signed)
We have not yet received her mammogram report. Can you: 1. Let pt know this and that we will call her with results and recommendations when we have the report 2. Request mammogram from [facility]

## 2012-10-17 NOTE — Telephone Encounter (Signed)
Patient is calling to get results of the [place] that she had done at [facility] she said that she had this done yesterday and would really like the results before the weekend.

## 2012-10-27 ENCOUNTER — Encounter: Payer: Self-pay | Admitting: Internal Medicine

## 2012-10-30 ENCOUNTER — Encounter: Payer: Self-pay | Admitting: Internal Medicine

## 2012-11-03 ENCOUNTER — Ambulatory Visit: Payer: Self-pay | Admitting: Surgery

## 2012-11-11 ENCOUNTER — Ambulatory Visit: Payer: Self-pay | Admitting: Surgery

## 2012-11-11 LAB — CBC
HCT: 39.2 % (ref 35.0–47.0)
MCH: 31.6 pg (ref 26.0–34.0)
RBC: 4.22 10*6/uL (ref 3.80–5.20)
RDW: 13.8 % (ref 11.5–14.5)
WBC: 4.7 10*3/uL (ref 3.6–11.0)

## 2012-11-11 LAB — BASIC METABOLIC PANEL
Anion Gap: 5 — ABNORMAL LOW (ref 7–16)
BUN: 12 mg/dL (ref 7–18)
Co2: 32 mmol/L (ref 21–32)
Creatinine: 0.56 mg/dL — ABNORMAL LOW (ref 0.60–1.30)
EGFR (African American): >60
EGFR (Non-African Amer.): >60
Glucose: 83 mg/dL (ref 65–99)
Sodium: 137 mmol/L (ref 136–145)

## 2012-11-11 LAB — PATHOLOGY REPORT

## 2012-11-12 ENCOUNTER — Ambulatory Visit: Payer: Self-pay | Admitting: Surgery

## 2012-11-21 ENCOUNTER — Telehealth: Payer: Self-pay

## 2012-11-21 MED ORDER — CLONAZEPAM 0.5 MG PO TABS: 0.5000 mg | ORAL_TABLET | Freq: Three times a day (TID) | ORAL | Status: DC | PRN

## 2012-11-21 NOTE — Telephone Encounter (Signed)
Yes. We can start Clonazepam 0.5mg  po tid prn #90 with 1 refill. She will need to make a follow up appointment.

## 2012-11-21 NOTE — Telephone Encounter (Signed)
Pt was recently diagnosed with Breast Cancer and was wanting to know if she could be per scribed Clonazepam. She was per scribed xanax by her surgeon and they give her extremely bad dreams. Her surgeon suggested that she contact you to see if you could per scribed this for her. She asks for a low dosage. She is needing something to help her chill out a little bit.

## 2012-11-21 NOTE — Telephone Encounter (Signed)
Rx was called in to pharmacy and left message on patient voicemail to call to schedule follow up appointment

## 2012-12-05 ENCOUNTER — Ambulatory Visit: Payer: Self-pay | Admitting: Internal Medicine

## 2012-12-05 ENCOUNTER — Ambulatory Visit: Payer: Self-pay | Admitting: Oncology

## 2012-12-05 LAB — CBC CANCER CENTER
Basophil #: 0 x10 3/mm (ref 0.0–0.1)
Basophil %: 0.6 %
Eosinophil #: 0.1 x10 3/mm (ref 0.0–0.7)
Eosinophil %: 1.6 %
HCT: 40.3 % (ref 35.0–47.0)
HGB: 13.5 g/dL (ref 12.0–16.0)
Lymphocyte #: 1.6 x10 3/mm (ref 1.0–3.6)
Lymphocyte %: 29.6 %
MCH: 31.2 pg (ref 26.0–34.0)
MCHC: 33.6 g/dL (ref 32.0–36.0)
MCV: 93 fL (ref 80–100)
Monocyte #: 0.4 x10 3/mm (ref 0.2–0.9)
Monocyte %: 8.1 %
Neutrophil #: 3.2 x10 3/mm (ref 1.4–6.5)
RBC: 4.34 10*6/uL (ref 3.80–5.20)
RDW: 14 % (ref 11.5–14.5)
WBC: 5.3 x10 3/mm (ref 3.6–11.0)

## 2012-12-05 LAB — COMPREHENSIVE METABOLIC PANEL
Alkaline Phosphatase: 75 U/L (ref 50–136)
Anion Gap: 5 — ABNORMAL LOW (ref 7–16)
BUN: 8 mg/dL (ref 7–18)
Bilirubin,Total: 0.5 mg/dL (ref 0.2–1.0)
Calcium, Total: 9.4 mg/dL (ref 8.5–10.1)
Chloride: 98 mmol/L (ref 98–107)
Co2: 35 mmol/L — ABNORMAL HIGH (ref 21–32)
EGFR (African American): >60
Glucose: 94 mg/dL (ref 65–99)
Osmolality: 274 (ref 275–301)
SGOT(AST): 16 U/L (ref 15–37)
Sodium: 138 mmol/L (ref 136–145)
Total Protein: 7.4 g/dL (ref 6.4–8.2)

## 2012-12-05 LAB — PHOSPHORUS: Phosphorus: 3.8 mg/dL (ref 2.5–4.9)

## 2012-12-05 LAB — MAGNESIUM: Magnesium: 2 mg/dL

## 2012-12-08 ENCOUNTER — Telehealth: Payer: Self-pay | Admitting: Emergency Medicine

## 2012-12-08 ENCOUNTER — Ambulatory Visit: Payer: Self-pay | Admitting: Oncology

## 2012-12-08 DIAGNOSIS — F32A Depression, unspecified: Secondary | ICD-10-CM

## 2012-12-08 DIAGNOSIS — F329 Major depressive disorder, single episode, unspecified: Secondary | ICD-10-CM

## 2012-12-08 NOTE — Telephone Encounter (Signed)
Patient would like to talk with you about her breast cancer issue, she has a PET SCAN and a port put in tomorrow morning. Could you please call her, she is quite anxious. Thanks.

## 2012-12-08 NOTE — Telephone Encounter (Signed)
Called to patient to see if I could help her, per patient she would like to speak directly to Dr. Dan Humphreys and no one else. Also she stated she will be available all, you can call her anytime today. She has to have a port put in tomorrow morning.

## 2012-12-09 ENCOUNTER — Ambulatory Visit: Payer: Self-pay | Admitting: Surgery

## 2012-12-09 NOTE — Telephone Encounter (Signed)
Spoke with pt. Having significant increase in depression with recent breast cancer diagnosis.  Danielle Sparks - Can you see if we can get her into see Dr. Maryruth Bun in psychiatry this week? Can you also set her up here either later this week or next week for follow up. And, can you ask for records from [facility]

## 2012-12-15 LAB — CBC CANCER CENTER
Basophil #: 0 x10 3/mm (ref 0.0–0.1)
Basophil %: 0.7 %
Eosinophil #: 0.1 x10 3/mm (ref 0.0–0.7)
Eosinophil %: 1.8 %
HCT: 38.3 % (ref 35.0–47.0)
HGB: 13.3 g/dL (ref 12.0–16.0)
Lymphocyte %: 32.2 %
MCH: 32.1 pg (ref 26.0–34.0)
MCHC: 34.8 g/dL (ref 32.0–36.0)
MCV: 92 fL (ref 80–100)
Monocyte #: 0.4 x10 3/mm (ref 0.2–0.9)
Monocyte %: 9.3 %
Neutrophil #: 2.4 x10 3/mm (ref 1.4–6.5)
Neutrophil %: 56 %
Platelet: 208 x10 3/mm (ref 150–440)
RDW: 13.7 % (ref 11.5–14.5)
WBC: 4.3 x10 3/mm (ref 3.6–11.0)

## 2012-12-15 LAB — COMPREHENSIVE METABOLIC PANEL
Alkaline Phosphatase: 80 U/L (ref 50–136)
Bilirubin,Total: 0.7 mg/dL (ref 0.2–1.0)
Co2: 34 mmol/L — ABNORMAL HIGH (ref 21–32)
EGFR (Non-African Amer.): >60
Osmolality: 273 (ref 275–301)
Potassium: 4.8 mmol/L (ref 3.5–5.1)
SGOT(AST): 27 U/L (ref 15–37)
SGPT (ALT): 51 U/L (ref 12–78)
Sodium: 137 mmol/L (ref 136–145)
Total Protein: 7.4 g/dL (ref 6.4–8.2)

## 2012-12-17 ENCOUNTER — Ambulatory Visit: Payer: BC Managed Care – PPO | Admitting: Internal Medicine

## 2012-12-17 ENCOUNTER — Telehealth: Payer: Self-pay | Admitting: Internal Medicine

## 2012-12-17 NOTE — Telephone Encounter (Signed)
Left message to call back  

## 2012-12-17 NOTE — Telephone Encounter (Signed)
I noticed that pt cancelled her follow up appointment today. Can you make sure that everything is okay with her?

## 2012-12-17 NOTE — Telephone Encounter (Signed)
Spoke with patient and she states she is fine, just exhausted from doing chemo, they had trouble getting her port to work and she had to have that checked out but other than that she is fine. She would love to come see you if possible, she would like to see you soon and need something on a Monday morning preferably.

## 2012-12-17 NOTE — Telephone Encounter (Signed)
Can you set her up on a Monday whenever is convenient for her? preferred.

## 2012-12-18 NOTE — Telephone Encounter (Signed)
Spoke with patient and scheduled her an appointment for 4/21 @ 1130

## 2012-12-19 ENCOUNTER — Ambulatory Visit: Payer: Self-pay | Admitting: Surgery

## 2012-12-19 LAB — PATHOLOGY REPORT

## 2012-12-22 LAB — CBC CANCER CENTER
Basophil #: 0 x10 3/mm (ref 0.0–0.1)
Eosinophil #: 0.1 x10 3/mm (ref 0.0–0.7)
Lymphocyte #: 0.7 x10 3/mm — ABNORMAL LOW (ref 1.0–3.6)
Lymphocyte %: 43.6 %
MCH: 32.1 pg (ref 26.0–34.0)
MCV: 94 fL (ref 80–100)
Monocyte %: 3.1 %
Neutrophil #: 0.8 x10 3/mm — ABNORMAL LOW (ref 1.4–6.5)
Neutrophil %: 46.1 %
Platelet: 159 x10 3/mm (ref 150–440)
RBC: 4.23 10*6/uL (ref 3.80–5.20)
RDW: 13.1 % (ref 11.5–14.5)

## 2012-12-23 ENCOUNTER — Ambulatory Visit: Payer: Self-pay | Admitting: Oncology

## 2012-12-23 ENCOUNTER — Ambulatory Visit: Payer: Self-pay | Admitting: Internal Medicine

## 2012-12-29 LAB — CBC CANCER CENTER
Basophil %: 0.6 %
Eosinophil #: 0 x10 3/mm (ref 0.0–0.7)
Lymphocyte #: 2.1 x10 3/mm (ref 1.0–3.6)
MCH: 31.5 pg (ref 26.0–34.0)
MCHC: 33.9 g/dL (ref 32.0–36.0)
MCV: 93 fL (ref 80–100)
Monocyte #: 0.7 x10 3/mm (ref 0.2–0.9)
Monocyte %: 5.6 %
Neutrophil %: 77.3 %
RBC: 3.91 10*6/uL (ref 3.80–5.20)
RDW: 13.1 % (ref 11.5–14.5)
WBC: 13 x10 3/mm — ABNORMAL HIGH (ref 3.6–11.0)

## 2013-01-05 LAB — BASIC METABOLIC PANEL
Anion Gap: 5 — ABNORMAL LOW (ref 7–16)
Calcium, Total: 8.6 mg/dL (ref 8.5–10.1)
Chloride: 101 mmol/L (ref 98–107)
EGFR (African American): >60
EGFR (Non-African Amer.): >60
Glucose: 159 mg/dL — ABNORMAL HIGH (ref 65–99)
Osmolality: 274 (ref 275–301)
Potassium: 3.9 mmol/L (ref 3.5–5.1)
Sodium: 136 mmol/L (ref 136–145)

## 2013-01-05 LAB — CBC CANCER CENTER
Basophil #: 0 x10 3/mm (ref 0.0–0.1)
Eosinophil #: 0 x10 3/mm (ref 0.0–0.7)
HCT: 33.8 % — ABNORMAL LOW (ref 35.0–47.0)
HGB: 11.4 g/dL — ABNORMAL LOW (ref 12.0–16.0)
Lymphocyte #: 1.3 x10 3/mm (ref 1.0–3.6)
Lymphocyte %: 15.6 %
MCH: 31.6 pg (ref 26.0–34.0)
Monocyte #: 0.4 x10 3/mm (ref 0.2–0.9)
Monocyte %: 5.4 %
Neutrophil #: 6.5 x10 3/mm (ref 1.4–6.5)
RBC: 3.63 10*6/uL — ABNORMAL LOW (ref 3.80–5.20)
RDW: 13.4 % (ref 11.5–14.5)

## 2013-01-12 ENCOUNTER — Ambulatory Visit (INDEPENDENT_AMBULATORY_CARE_PROVIDER_SITE_OTHER): Payer: BC Managed Care – PPO | Admitting: Internal Medicine

## 2013-01-12 ENCOUNTER — Encounter: Payer: Self-pay | Admitting: Internal Medicine

## 2013-01-12 VITALS — BP 138/106 | HR 94 | Temp 97.4°F | Wt 171.0 lb

## 2013-01-12 DIAGNOSIS — F32A Depression, unspecified: Secondary | ICD-10-CM

## 2013-01-12 DIAGNOSIS — C50919 Malignant neoplasm of unspecified site of unspecified female breast: Secondary | ICD-10-CM

## 2013-01-12 DIAGNOSIS — F3289 Other specified depressive episodes: Secondary | ICD-10-CM

## 2013-01-12 DIAGNOSIS — F329 Major depressive disorder, single episode, unspecified: Secondary | ICD-10-CM

## 2013-01-12 DIAGNOSIS — C50911 Malignant neoplasm of unspecified site of right female breast: Secondary | ICD-10-CM | POA: Insufficient documentation

## 2013-01-12 HISTORY — DX: Malignant neoplasm of unspecified site of unspecified female breast: C50.919

## 2013-01-12 HISTORY — DX: Malignant neoplasm of unspecified site of right female breast: C50.911

## 2013-01-12 LAB — CBC CANCER CENTER
Basophil #: 0 x10 3/mm (ref 0.0–0.1)
Basophil %: 0.8 %
Eosinophil #: 0 x10 3/mm (ref 0.0–0.7)
Lymphocyte %: 17.8 %
MCHC: 34 g/dL (ref 32.0–36.0)
MCV: 94 fL (ref 80–100)
Monocyte #: 0.1 x10 3/mm — ABNORMAL LOW (ref 0.2–0.9)
Monocyte %: 2.4 %
Neutrophil %: 77.9 %
Platelet: 205 x10 3/mm (ref 150–440)
RBC: 3.84 10*6/uL (ref 3.80–5.20)
RDW: 13.4 % (ref 11.5–14.5)
WBC: 3.5 x10 3/mm — ABNORMAL LOW (ref 3.6–11.0)

## 2013-01-12 NOTE — Progress Notes (Signed)
Subjective:    Patient ID: Danielle Sparks, female    DOB: [birth year], 52 y.o.   MRN: [id number]  HPI 52 year old female with history of anxiety/depression presents for followup after recent diagnosis of breast cancer. She was found to have abnormalities on routine mammogram and ultimately underwent stereotactic biopsy showing cancer within the right breast with one of 15 lymph nodes positive. She is now undergoing chemotherapy and plans for radiation therapy to follow. She is tearful today describing her ongoing course of treatment. She is being followed by a psychiatrist in dose of Effexor has been increased. She has recently been started on Abilify. She reports excellent support from family and friends. She has been unable to work on a regular basis. She feels that she is tolerating the chemotherapy well except for fatigue.  Outpatient Encounter Prescriptions as of 01/12/2013  Medication Sig Dispense Refill  . ARIPiprazole (ABILIFY) 2 MG tablet Take 2 mg by mouth daily.      . clonazePAM (KLONOPIN) 0.5 MG tablet Take 1 tablet (0.5 mg total) by mouth 3 (three) times daily as needed for anxiety.  90 tablet  1  . omeprazole (PRILOSEC) 20 MG capsule Take 20 mg by mouth 2 (two) times daily.       . ondansetron (ZOFRAN) 4 MG tablet Take 4 mg by mouth every 12 (twelve) hours as needed for nausea.       Marland Kitchen venlafaxine (EFFEXOR) 75 MG tablet Take 75 mg by mouth 3 (three) times daily.      . cyanocobalamin (,VITAMIN B-12,) 1000 MCG/ML injection Inject 1 mL (1,000 mcg total) into the muscle every 30 (thirty) days.  1 mL  11  . venlafaxine XR (EFFEXOR-XR) 150 MG 24 hr capsule Take 1 capsule (150 mg total) by mouth daily.  30 capsule  11   No facility-administered encounter medications on file as of 01/12/2013.   BP 138/106  Pulse 94  Temp(Src) 97.4 F (36.3 C) (Oral)  Wt 171 lb (77.565 kg)  BMI 28.46 kg/m2  SpO2 96%  Review of Systems  Constitutional: Positive for fatigue. Negative for fever, chills,  appetite change and unexpected weight change.  HENT: Negative for ear pain, congestion, sore throat, trouble swallowing, neck pain, voice change and sinus pressure.   Eyes: Negative for visual disturbance.  Respiratory: Negative for cough, shortness of breath, wheezing and stridor.   Cardiovascular: Negative for chest pain, palpitations and leg swelling.  Gastrointestinal: Negative for nausea, vomiting, abdominal pain, diarrhea, constipation, blood in stool, abdominal distention and anal bleeding.  Genitourinary: Negative for dysuria and flank pain.  Musculoskeletal: Negative for myalgias, arthralgias and gait problem.  Skin: Negative for color change and rash.  Neurological: Negative for dizziness and headaches.  Hematological: Negative for adenopathy. Does not bruise/bleed easily.  Psychiatric/Behavioral: Positive for sleep disturbance and dysphoric mood. Negative for suicidal ideas. The patient is nervous/anxious.        Objective:   Physical Exam  Constitutional: She is oriented to person, place, and time. She appears well-developed and well-nourished. No distress.  HENT:  Head: Normocephalic and atraumatic.  Right Ear: External ear normal.  Left Ear: External ear normal.  Nose: Nose normal.  Mouth/Throat: Oropharynx is clear and moist.  Eyes: Conjunctivae are normal. Pupils are equal, round, and reactive to light. Right eye exhibits no discharge. Left eye exhibits no discharge. No scleral icterus.  Neck: Normal range of motion. Neck supple. No tracheal deviation present. No thyromegaly present.  Cardiovascular: Normal rate, regular rhythm, normal heart  sounds and intact distal pulses.  Exam reveals no gallop and no friction rub.   No murmur heard. Pulmonary/Chest: Effort normal and breath sounds normal. No respiratory distress. She has no wheezes. She has no rales. She exhibits no tenderness.  Musculoskeletal: Normal range of motion. She exhibits no edema and no tenderness.   Lymphadenopathy:    She has no cervical adenopathy.  Neurological: She is alert and oriented to person, place, and time. No cranial nerve deficit. She exhibits normal muscle tone. Coordination normal.  Skin: Skin is warm and dry. No rash noted. She is not diaphoretic. No erythema. No pallor.  Psychiatric: Her speech is normal and behavior is normal. Judgment and thought content normal. Her mood appears anxious. Cognition and memory are normal. She exhibits a depressed mood. She expresses no suicidal ideation. She expresses no suicidal plans.          Assessment & Plan:

## 2013-01-12 NOTE — Assessment & Plan Note (Signed)
Symptoms of anxiety and depression have recently worsened. Patient is now being seen by a psychiatrist to increase dose of Effexor to 225 mg daily. She has also been instructed to start Abilify 2 mg daily. Encouraged her to follow through with this. Pt denies suicidal ideation. Offered support today. Discussed taking time for herself and allowing time to heal from chemotherapy.  Over of which >50% spent in face-to-face contact with patient discussing plan of care

## 2013-01-12 NOTE — Patient Instructions (Signed)
Danielle [name]  

## 2013-01-12 NOTE — Assessment & Plan Note (Signed)
Followed at [facility] cancer center by Dr. Koleen Nimrod. Undergoing chemotherapy with plans to follow with XRT. Will request recent notes as to plan of care.

## 2013-01-19 LAB — CBC CANCER CENTER
HCT: 32.6 % — ABNORMAL LOW (ref 35.0–47.0)
HGB: 11 g/dL — ABNORMAL LOW (ref 12.0–16.0)
MCH: 31.4 pg (ref 26.0–34.0)
MCHC: 33.7 g/dL (ref 32.0–36.0)
Monocyte #: 1.1 x10 3/mm — ABNORMAL HIGH (ref 0.2–0.9)
Monocyte %: 7.8 %
Neutrophil %: 79 %
RBC: 3.49 10*6/uL — ABNORMAL LOW (ref 3.80–5.20)
RDW: 13.7 % (ref 11.5–14.5)

## 2013-01-22 ENCOUNTER — Ambulatory Visit: Payer: Self-pay | Admitting: Internal Medicine

## 2013-01-22 ENCOUNTER — Ambulatory Visit: Payer: Self-pay | Admitting: Oncology

## 2013-01-26 LAB — CBC CANCER CENTER
Basophil #: 0 x10 3/mm (ref 0.0–0.1)
Basophil %: 0.4 %
Eosinophil #: 0 x10 3/mm (ref 0.0–0.7)
HCT: 30.8 % — ABNORMAL LOW (ref 35.0–47.0)
HGB: 10.8 g/dL — ABNORMAL LOW (ref 12.0–16.0)
MCH: 32.6 pg (ref 26.0–34.0)
Monocyte #: 0.7 x10 3/mm (ref 0.2–0.9)
Neutrophil %: 69.2 %
RBC: 3.32 10*6/uL — ABNORMAL LOW (ref 3.80–5.20)

## 2013-01-26 LAB — COMPREHENSIVE METABOLIC PANEL
Albumin: 3.8 g/dL (ref 3.4–5.0)
Alkaline Phosphatase: 85 U/L (ref 50–136)
Bilirubin,Total: 0.2 mg/dL (ref 0.2–1.0)
Chloride: 102 mmol/L (ref 98–107)
Co2: 31 mmol/L (ref 21–32)
Creatinine: 0.8 mg/dL (ref 0.60–1.30)
EGFR (African American): >60
EGFR (Non-African Amer.): >60
Glucose: 100 mg/dL — ABNORMAL HIGH (ref 65–99)
Osmolality: 282 (ref 275–301)
SGOT(AST): 14 U/L — ABNORMAL LOW (ref 15–37)
Sodium: 142 mmol/L (ref 136–145)
Total Protein: 6.5 g/dL (ref 6.4–8.2)

## 2013-02-02 LAB — CBC CANCER CENTER
Eosinophil %: 0.8 %
HGB: 11.3 g/dL — ABNORMAL LOW (ref 12.0–16.0)
Lymphocyte %: 16.6 %
MCHC: 34.4 g/dL (ref 32.0–36.0)
Monocyte %: 2.3 %
Neutrophil #: 2.2 x10 3/mm (ref 1.4–6.5)
Neutrophil %: 80.2 %
Platelet: 142 x10 3/mm — ABNORMAL LOW (ref 150–440)
RBC: 3.47 10*6/uL — ABNORMAL LOW (ref 3.80–5.20)
RDW: 15.4 % — ABNORMAL HIGH (ref 11.5–14.5)

## 2013-02-09 LAB — CBC CANCER CENTER
Basophil #: 0.1 x10 3/mm (ref 0.0–0.1)
Basophil %: 0.6 %
Eosinophil #: 0.1 x10 3/mm (ref 0.0–0.7)
HCT: 34.2 % — ABNORMAL LOW (ref 35.0–47.0)
HGB: 11.5 g/dL — ABNORMAL LOW (ref 12.0–16.0)
Lymphocyte #: 1.3 x10 3/mm (ref 1.0–3.6)
Lymphocyte %: 9.5 %
MCHC: 33.8 g/dL (ref 32.0–36.0)
MCV: 96 fL (ref 80–100)
Monocyte #: 0.9 x10 3/mm (ref 0.2–0.9)
Neutrophil #: 10.9 x10 3/mm — ABNORMAL HIGH (ref 1.4–6.5)
RBC: 3.57 10*6/uL — ABNORMAL LOW (ref 3.80–5.20)
RDW: 15.7 % — ABNORMAL HIGH (ref 11.5–14.5)

## 2013-02-17 LAB — CBC CANCER CENTER
Basophil %: 1.1 %
HCT: 31.2 % — ABNORMAL LOW (ref 35.0–47.0)
Lymphocyte #: 0.8 x10 3/mm — ABNORMAL LOW (ref 1.0–3.6)
Lymphocyte %: 16.9 %
MCH: 33.3 pg (ref 26.0–34.0)
MCHC: 35.1 g/dL (ref 32.0–36.0)
MCV: 95 fL (ref 80–100)
Monocyte #: 0.5 x10 3/mm (ref 0.2–0.9)
Monocyte %: 11.8 %
Neutrophil #: 3.2 x10 3/mm (ref 1.4–6.5)
Neutrophil %: 69.7 %
Platelet: 396 x10 3/mm (ref 150–440)
WBC: 4.6 x10 3/mm (ref 3.6–11.0)

## 2013-02-17 LAB — COMPREHENSIVE METABOLIC PANEL
Anion Gap: 9 (ref 7–16)
BUN: 15 mg/dL (ref 7–18)
Calcium, Total: 9.1 mg/dL (ref 8.5–10.1)
Chloride: 101 mmol/L (ref 98–107)
Co2: 30 mmol/L (ref 21–32)
EGFR (African American): >60
EGFR (Non-African Amer.): >60
Osmolality: 282 (ref 275–301)
Potassium: 3.9 mmol/L (ref 3.5–5.1)
SGOT(AST): 36 U/L (ref 15–37)
SGPT (ALT): 57 U/L (ref 12–78)
Sodium: 140 mmol/L (ref 136–145)
Total Protein: 6.6 g/dL (ref 6.4–8.2)

## 2013-02-22 ENCOUNTER — Ambulatory Visit: Payer: Self-pay | Admitting: Internal Medicine

## 2013-02-22 ENCOUNTER — Ambulatory Visit: Payer: Self-pay | Admitting: Oncology

## 2013-02-23 LAB — CBC CANCER CENTER
Basophil %: 0.3 %
HCT: 34 % — ABNORMAL LOW (ref 35.0–47.0)
HGB: 11.6 g/dL — ABNORMAL LOW (ref 12.0–16.0)
Lymphocyte %: 10.5 %
MCH: 33.1 pg (ref 26.0–34.0)
MCV: 97 fL (ref 80–100)
Monocyte #: 0.1 x10 3/mm — ABNORMAL LOW (ref 0.2–0.9)
Platelet: 158 x10 3/mm (ref 150–440)
RBC: 3.51 10*6/uL — ABNORMAL LOW (ref 3.80–5.20)
RDW: 17 % — ABNORMAL HIGH (ref 11.5–14.5)

## 2013-03-09 LAB — COMPREHENSIVE METABOLIC PANEL
Albumin: 3.8 g/dL (ref 3.4–5.0)
Anion Gap: 7 (ref 7–16)
BUN: 14 mg/dL (ref 7–18)
Bilirubin,Total: 0.4 mg/dL (ref 0.2–1.0)
Chloride: 102 mmol/L (ref 98–107)
Co2: 29 mmol/L (ref 21–32)
Creatinine: 0.93 mg/dL (ref 0.60–1.30)
EGFR (African American): >60
EGFR (Non-African Amer.): >60
Glucose: 116 mg/dL — ABNORMAL HIGH (ref 65–99)
Osmolality: 277 (ref 275–301)
Potassium: 3.9 mmol/L (ref 3.5–5.1)
SGOT(AST): 27 U/L (ref 15–37)

## 2013-03-09 LAB — CBC CANCER CENTER
Basophil #: 0 x10 3/mm (ref 0.0–0.1)
Basophil %: 0.8 %
Eosinophil %: 0.5 %
HCT: 30.5 % — ABNORMAL LOW (ref 35.0–47.0)
MCV: 99 fL (ref 80–100)
Monocyte #: 0.4 x10 3/mm (ref 0.2–0.9)
Neutrophil #: 2.8 x10 3/mm (ref 1.4–6.5)
Neutrophil %: 72.9 %
RBC: 3.1 10*6/uL — ABNORMAL LOW (ref 3.80–5.20)
RDW: 18.7 % — ABNORMAL HIGH (ref 11.5–14.5)

## 2013-03-16 LAB — CBC CANCER CENTER
Basophil #: 0.1 x10 3/mm (ref 0.0–0.1)
Basophil %: 1.1 %
Eosinophil %: 0.9 %
HGB: 10.4 g/dL — ABNORMAL LOW (ref 12.0–16.0)
MCHC: 36 g/dL (ref 32.0–36.0)
MCV: 99 fL (ref 80–100)
Neutrophil #: 3.9 x10 3/mm (ref 1.4–6.5)
RBC: 2.93 10*6/uL — ABNORMAL LOW (ref 3.80–5.20)
RDW: 17.9 % — ABNORMAL HIGH (ref 11.5–14.5)
WBC: 5.4 x10 3/mm (ref 3.6–11.0)

## 2013-03-23 LAB — CBC CANCER CENTER
Basophil #: 0.1 x10 3/mm (ref 0.0–0.1)
Basophil %: 2.5 %
Lymphocyte %: 20.1 %
MCH: 35.3 pg — ABNORMAL HIGH (ref 26.0–34.0)
MCV: 100 fL (ref 80–100)
Monocyte #: 0.3 x10 3/mm (ref 0.2–0.9)
Monocyte %: 7.8 %
Neutrophil #: 2.6 x10 3/mm (ref 1.4–6.5)
Neutrophil %: 67.4 %
Platelet: 255 x10 3/mm (ref 150–440)
RBC: 2.92 10*6/uL — ABNORMAL LOW (ref 3.80–5.20)
RDW: 17.6 % — ABNORMAL HIGH (ref 11.5–14.5)

## 2013-03-24 ENCOUNTER — Ambulatory Visit: Payer: Self-pay | Admitting: Internal Medicine

## 2013-03-24 ENCOUNTER — Ambulatory Visit: Payer: Self-pay | Admitting: Oncology

## 2013-03-30 LAB — COMPREHENSIVE METABOLIC PANEL
Anion Gap: 9 (ref 7–16)
BUN: 10 mg/dL (ref 7–18)
Bilirubin,Total: 0.4 mg/dL (ref 0.2–1.0)
Calcium, Total: 9.2 mg/dL (ref 8.5–10.1)
Chloride: 101 mmol/L (ref 98–107)
Creatinine: 0.79 mg/dL (ref 0.60–1.30)
EGFR (African American): >60
EGFR (Non-African Amer.): >60
Glucose: 129 mg/dL — ABNORMAL HIGH (ref 65–99)
Osmolality: 278 (ref 275–301)
SGOT(AST): 24 U/L (ref 15–37)
SGPT (ALT): 77 U/L (ref 12–78)

## 2013-03-30 LAB — CBC CANCER CENTER
Eosinophil %: 3.6 %
HCT: 30.9 % — ABNORMAL LOW (ref 35.0–47.0)
HGB: 10.9 g/dL — ABNORMAL LOW (ref 12.0–16.0)
Lymphocyte %: 16.7 %
MCH: 36 pg — ABNORMAL HIGH (ref 26.0–34.0)
MCHC: 35.3 g/dL (ref 32.0–36.0)
MCV: 102 fL — ABNORMAL HIGH (ref 80–100)
Monocyte %: 6.9 %
Neutrophil #: 3.4 x10 3/mm (ref 1.4–6.5)
Platelet: 338 x10 3/mm (ref 150–440)
RBC: 3.03 10*6/uL — ABNORMAL LOW (ref 3.80–5.20)
RDW: 17.1 % — ABNORMAL HIGH (ref 11.5–14.5)

## 2013-04-06 LAB — CBC CANCER CENTER
Eosinophil #: 0.2 x10 3/mm (ref 0.0–0.7)
Eosinophil %: 3.5 %
Lymphocyte %: 16.8 %
MCH: 36.9 pg — ABNORMAL HIGH (ref 26.0–34.0)
Monocyte #: 0.3 x10 3/mm (ref 0.2–0.9)
Neutrophil %: 72.4 %
RBC: 3.08 10*6/uL — ABNORMAL LOW (ref 3.80–5.20)
WBC: 4.6 x10 3/mm (ref 3.6–11.0)

## 2013-04-13 LAB — CBC CANCER CENTER
Basophil #: 0 "x10 3/mm "
Basophil %: 0.8 %
Eosinophil #: 0.1 "x10 3/mm "
Eosinophil %: 2.6 %
HCT: 31.7 % — ABNORMAL LOW
HGB: 11.5 g/dL — ABNORMAL LOW
Lymphocyte %: 16.5 %
Lymphs Abs: 0.8 "x10 3/mm " — ABNORMAL LOW
MCH: 37.2 pg — ABNORMAL HIGH
MCHC: 36.3 g/dL — ABNORMAL HIGH
MCV: 102 fL — ABNORMAL HIGH
Monocyte #: 0.3 "x10 3/mm "
Monocyte %: 6.8 %
Neutrophil #: 3.6 "x10 3/mm "
Neutrophil %: 73.3 %
Platelet: 260 "x10 3/mm "
RBC: 3.09 "x10 6/mm " — ABNORMAL LOW
RDW: 15.7 % — ABNORMAL HIGH
WBC: 4.9 "x10 3/mm "

## 2013-04-13 LAB — COMPREHENSIVE METABOLIC PANEL
Albumin: 3.8 g/dL (ref 3.4–5.0)
Anion Gap: 9 (ref 7–16)
BUN: 11 mg/dL (ref 7–18)
Calcium, Total: 8.9 mg/dL (ref 8.5–10.1)
Co2: 29 mmol/L (ref 21–32)
Creatinine: 0.85 mg/dL (ref 0.60–1.30)
EGFR (African American): >60
Osmolality: 274 (ref 275–301)
SGOT(AST): 23 U/L (ref 15–37)
Sodium: 137 mmol/L (ref 136–145)
Total Protein: 6.8 g/dL (ref 6.4–8.2)

## 2013-04-17 ENCOUNTER — Ambulatory Visit (INDEPENDENT_AMBULATORY_CARE_PROVIDER_SITE_OTHER): Payer: BC Managed Care – PPO | Admitting: Internal Medicine

## 2013-04-17 ENCOUNTER — Encounter: Payer: Self-pay | Admitting: Internal Medicine

## 2013-04-17 VITALS — BP 130/90 | HR 84 | Temp 98.2°F | Wt 186.0 lb

## 2013-04-17 DIAGNOSIS — C50911 Malignant neoplasm of unspecified site of right female breast: Secondary | ICD-10-CM

## 2013-04-17 DIAGNOSIS — F32A Depression, unspecified: Secondary | ICD-10-CM

## 2013-04-17 DIAGNOSIS — I10 Essential (primary) hypertension: Secondary | ICD-10-CM

## 2013-04-17 DIAGNOSIS — F3289 Other specified depressive episodes: Secondary | ICD-10-CM

## 2013-04-17 DIAGNOSIS — C50919 Malignant neoplasm of unspecified site of unspecified female breast: Secondary | ICD-10-CM

## 2013-04-17 DIAGNOSIS — F329 Major depressive disorder, single episode, unspecified: Secondary | ICD-10-CM

## 2013-04-17 NOTE — Progress Notes (Signed)
Subjective:    Patient ID: Danielle Sparks, female    DOB: 1960/10/01, 52 y.o.   MRN: [id number]  HPI 52YO female with history of depression, anxiety, and recent diagnosis of breast cancer currently undergoing chemotherapy with plans for radiation in the future presents for followup. She reports she is generally doing well. She has been followed by psychiatry and was recently started on Abilify and bupropion. She reports some improvement in symptoms of depression and anxiety. She feels that she is coping well. She is tolerating chemotherapy well with some fatigue but no nausea or vomiting. She denies any new concerns today.  Outpatient Encounter Prescriptions as of 04/17/2013  Medication Sig Dispense Refill  . ARIPiprazole (ABILIFY) 2 MG tablet Take 2 mg by mouth daily.      Marland Kitchen buPROPion (WELLBUTRIN) 75 MG tablet       . clonazePAM (KLONOPIN) 0.5 MG tablet Take 1 tablet (0.5 mg total) by mouth 3 (three) times daily as needed for anxiety.  90 tablet  1  . hydrochlorothiazide (HYDRODIURIL) 25 MG tablet       . omeprazole (PRILOSEC) 20 MG capsule Take 20 mg by mouth 2 (two) times daily.       . promethazine (PHENERGAN) 25 MG tablet       . venlafaxine (EFFEXOR) 75 MG tablet Take 75 mg by mouth 3 (three) times daily.      Marland Kitchen venlafaxine XR (EFFEXOR-XR) 150 MG 24 hr capsule Take 1 capsule (150 mg total) by mouth daily.  30 capsule  11  . cyanocobalamin (,VITAMIN B-12,) 1000 MCG/ML injection Inject 1 mL (1,000 mcg total) into the muscle every 30 (thirty) days.  1 mL  11  . ondansetron (ZOFRAN) 4 MG tablet Take 4 mg by mouth every 12 (twelve) hours as needed for nausea.        No facility-administered encounter medications on file as of 04/17/2013.   BP 130/90  Pulse 84  Temp(Src) 98.2 F (36.8 C) (Oral)  Wt 186 lb (84.369 kg)  BMI 30.95 kg/m2  SpO2 98%  Review of Systems  Constitutional: Negative for fever, chills, appetite change, fatigue and unexpected weight change.  HENT: Negative for  hearing loss, congestion, sore throat, sneezing, mouth sores, trouble swallowing, voice change and sinus pressure.   Eyes: Negative for pain, discharge, redness and visual disturbance.  Respiratory: Negative for cough, chest tightness, shortness of breath, wheezing and stridor.   Cardiovascular: Negative for chest pain, palpitations and leg swelling.  Gastrointestinal: Negative for nausea, vomiting, abdominal pain, diarrhea, constipation, blood in stool, abdominal distention and anal bleeding.  Genitourinary: Negative for dysuria and flank pain.  Musculoskeletal: Negative for myalgias, arthralgias and gait problem.  Skin: Negative for color change and rash.  Neurological: Negative for dizziness, weakness, light-headedness and headaches.  Hematological: Negative for adenopathy. Does not bruise/bleed easily.  Psychiatric/Behavioral: Negative for suicidal ideas, sleep disturbance and dysphoric mood. The patient is not nervous/anxious.        Objective:   Physical Exam  Constitutional: She is oriented to person, place, and time. She appears well-developed and well-nourished. No distress.  HENT:  Head: Normocephalic and atraumatic.  Right Ear: External ear normal.  Left Ear: External ear normal.  Nose: Nose normal.  Mouth/Throat: Oropharynx is clear and moist. No oropharyngeal exudate.  Eyes: Conjunctivae are normal. Pupils are equal, round, and reactive to light. Right eye exhibits no discharge. Left eye exhibits no discharge. No scleral icterus.  Neck: Normal range of motion. Neck supple. No tracheal  deviation present. No thyromegaly present.  Cardiovascular: Normal rate, regular rhythm, normal heart sounds and intact distal pulses.  Exam reveals no gallop and no friction rub.   No murmur heard. Pulmonary/Chest: Effort normal and breath sounds normal. No accessory muscle usage. Not tachypneic. No respiratory distress. She has no decreased breath sounds. She has no wheezes. She has no rhonchi.  She has no rales. She exhibits no tenderness.  Musculoskeletal: Normal range of motion. She exhibits no edema and no tenderness.  Lymphadenopathy:    She has no cervical adenopathy.  Neurological: She is alert and oriented to person, place, and time. No cranial nerve deficit. She exhibits normal muscle tone. Coordination normal.  Skin: Skin is warm and dry. No rash noted. She is not diaphoretic. No erythema. No pallor.  Psychiatric: She has a normal mood and affect. Her behavior is normal. Judgment and thought content normal.          Assessment & Plan:

## 2013-04-17 NOTE — Assessment & Plan Note (Signed)
Symptoms much improved on current regimen, with addition of abilify and wellbutrin. Will continue psychiatric care with Dr. Maryruth Bun. Follow up 6 months and prn.

## 2013-04-17 NOTE — Assessment & Plan Note (Signed)
BP Readings from Last 3 Encounters:  04/17/13 130/90  01/12/13 138/106  07/03/12 115/103   BP slightly elevated today, however has been well controlled during chemotherapy. Will continue to monitor. Continue HCTZ.

## 2013-04-17 NOTE — Assessment & Plan Note (Signed)
Currently undergoing chemotherapy with Taxol. Tolerating well. Scheduled for radiation therapy in the upcoming months. We'll continue to monitor.

## 2013-04-20 LAB — CBC CANCER CENTER
Basophil #: 0 x10 3/mm (ref 0.0–0.1)
Basophil %: 0.9 %
Eosinophil #: 0.1 x10 3/mm (ref 0.0–0.7)
HCT: 32.4 % — ABNORMAL LOW (ref 35.0–47.0)
HGB: 11.6 g/dL — ABNORMAL LOW (ref 12.0–16.0)
Lymphocyte #: 0.8 x10 3/mm — ABNORMAL LOW (ref 1.0–3.6)
MCH: 36.9 pg — ABNORMAL HIGH (ref 26.0–34.0)
MCHC: 35.8 g/dL (ref 32.0–36.0)
MCV: 103 fL — ABNORMAL HIGH (ref 80–100)
Monocyte #: 0.4 x10 3/mm (ref 0.2–0.9)
Neutrophil #: 3.6 x10 3/mm (ref 1.4–6.5)
Neutrophil %: 72.9 %
Platelet: 331 x10 3/mm (ref 150–440)
RBC: 3.14 10*6/uL — ABNORMAL LOW (ref 3.80–5.20)
WBC: 4.9 x10 3/mm (ref 3.6–11.0)

## 2013-04-20 LAB — COMPREHENSIVE METABOLIC PANEL
Albumin: 3.8 g/dL (ref 3.4–5.0)
Anion Gap: 6 — ABNORMAL LOW (ref 7–16)
BUN: 11 mg/dL (ref 7–18)
Chloride: 103 mmol/L (ref 98–107)
Creatinine: 0.8 mg/dL (ref 0.60–1.30)
EGFR (Non-African Amer.): >60
Osmolality: 276 (ref 275–301)
Potassium: 3.7 mmol/L (ref 3.5–5.1)
Sodium: 138 mmol/L (ref 136–145)

## 2013-04-24 ENCOUNTER — Ambulatory Visit: Payer: Self-pay | Admitting: Internal Medicine

## 2013-04-24 ENCOUNTER — Ambulatory Visit: Payer: Self-pay | Admitting: Oncology

## 2013-04-27 LAB — CBC CANCER CENTER
Basophil #: 0.1 x10 3/mm (ref 0.0–0.1)
Eosinophil #: 0.1 x10 3/mm (ref 0.0–0.7)
Eosinophil %: 2 %
HCT: 33.1 % — ABNORMAL LOW (ref 35.0–47.0)
HGB: 11.9 g/dL — ABNORMAL LOW (ref 12.0–16.0)
Lymphocyte #: 0.8 x10 3/mm — ABNORMAL LOW (ref 1.0–3.6)
Lymphocyte %: 16.9 %
MCH: 36.5 pg — ABNORMAL HIGH (ref 26.0–34.0)
MCHC: 36.1 g/dL — ABNORMAL HIGH (ref 32.0–36.0)
MCV: 101 fL — ABNORMAL HIGH (ref 80–100)
Monocyte #: 0.4 x10 3/mm (ref 0.2–0.9)
Monocyte %: 8.1 %
Neutrophil #: 3.6 x10 3/mm (ref 1.4–6.5)
RDW: 14.9 % — ABNORMAL HIGH (ref 11.5–14.5)
WBC: 5 x10 3/mm (ref 3.6–11.0)

## 2013-04-27 LAB — COMPREHENSIVE METABOLIC PANEL
Albumin: 3.7 g/dL (ref 3.4–5.0)
Alkaline Phosphatase: 87 U/L (ref 50–136)
Anion Gap: 6 — ABNORMAL LOW (ref 7–16)
BUN: 11 mg/dL (ref 7–18)
Bilirubin,Total: 0.3 mg/dL (ref 0.2–1.0)
Calcium, Total: 9.1 mg/dL (ref 8.5–10.1)
Chloride: 102 mmol/L (ref 98–107)
Co2: 29 mmol/L (ref 21–32)
Creatinine: 0.72 mg/dL (ref 0.60–1.30)
EGFR (Non-African Amer.): >60
Osmolality: 273 (ref 275–301)
Potassium: 3.6 mmol/L (ref 3.5–5.1)
SGOT(AST): 33 U/L (ref 15–37)
Sodium: 137 mmol/L (ref 136–145)

## 2013-05-04 LAB — CBC CANCER CENTER
Basophil #: 0.1 x10 3/mm (ref 0.0–0.1)
Basophil %: 1.3 %
Eosinophil #: 0.1 x10 3/mm (ref 0.0–0.7)
HGB: 12.8 g/dL (ref 12.0–16.0)
Lymphocyte #: 1 x10 3/mm (ref 1.0–3.6)
MCHC: 35.9 g/dL (ref 32.0–36.0)
MCV: 103 fL — ABNORMAL HIGH (ref 80–100)
Monocyte %: 8.8 %
Neutrophil #: 3.2 x10 3/mm (ref 1.4–6.5)
Neutrophil %: 66.7 %
RDW: 14.9 % — ABNORMAL HIGH (ref 11.5–14.5)

## 2013-05-11 LAB — COMPREHENSIVE METABOLIC PANEL
Alkaline Phosphatase: 109 U/L (ref 50–136)
Anion Gap: 7 (ref 7–16)
BUN: 10 mg/dL (ref 7–18)
Bilirubin,Total: 0.4 mg/dL (ref 0.2–1.0)
Chloride: 99 mmol/L (ref 98–107)
Co2: 31 mmol/L (ref 21–32)
Creatinine: 0.76 mg/dL (ref 0.60–1.30)
EGFR (African American): >60
EGFR (Non-African Amer.): >60
Glucose: 92 mg/dL (ref 65–99)
Osmolality: 273 (ref 275–301)
Potassium: 3.5 mmol/L (ref 3.5–5.1)
SGOT(AST): 26 U/L (ref 15–37)
SGPT (ALT): 60 U/L (ref 12–78)

## 2013-05-11 LAB — CBC CANCER CENTER
Basophil %: 1.3 %
Eosinophil #: 0.1 x10 3/mm (ref 0.0–0.7)
Eosinophil %: 1.7 %
HCT: 35.7 % (ref 35.0–47.0)
HGB: 12.4 g/dL (ref 12.0–16.0)
Lymphocyte #: 1.1 x10 3/mm (ref 1.0–3.6)
Lymphocyte %: 23.5 %
MCHC: 34.8 g/dL (ref 32.0–36.0)
Monocyte #: 0.5 x10 3/mm (ref 0.2–0.9)
Monocyte %: 10.2 %
Neutrophil %: 63.3 %
RBC: 3.5 10*6/uL — ABNORMAL LOW (ref 3.80–5.20)
RDW: 14.6 % — ABNORMAL HIGH (ref 11.5–14.5)
WBC: 4.6 x10 3/mm (ref 3.6–11.0)

## 2013-05-18 LAB — CBC CANCER CENTER
HGB: 11.9 g/dL — ABNORMAL LOW (ref 12.0–16.0)
Lymphocyte #: 1 x10 3/mm (ref 1.0–3.6)
Monocyte #: 0.4 x10 3/mm (ref 0.2–0.9)
Monocyte %: 10.4 %
Platelet: 311 x10 3/mm (ref 150–440)
RBC: 3.32 10*6/uL — ABNORMAL LOW (ref 3.80–5.20)

## 2013-05-25 ENCOUNTER — Ambulatory Visit: Payer: Self-pay | Admitting: Oncology

## 2013-05-25 ENCOUNTER — Ambulatory Visit: Payer: Self-pay | Admitting: Internal Medicine

## 2013-05-26 LAB — COMPREHENSIVE METABOLIC PANEL
Albumin: 3.8 g/dL (ref 3.4–5.0)
Alkaline Phosphatase: 75 U/L (ref 50–136)
BUN: 11 mg/dL (ref 7–18)
Bilirubin,Total: 0.6 mg/dL (ref 0.2–1.0)
Chloride: 98 mmol/L (ref 98–107)
EGFR (African American): >60
EGFR (Non-African Amer.): >60
Osmolality: 270 (ref 275–301)
Potassium: 3.3 mmol/L — ABNORMAL LOW (ref 3.5–5.1)
SGOT(AST): 24 U/L (ref 15–37)
SGPT (ALT): 47 U/L (ref 12–78)
Total Protein: 6.4 g/dL (ref 6.4–8.2)

## 2013-05-26 LAB — CBC CANCER CENTER
Basophil %: 1.4 %
Eosinophil %: 1.7 %
HGB: 11.8 g/dL — ABNORMAL LOW (ref 12.0–16.0)
Lymphocyte %: 24.7 %
MCH: 35.2 pg — ABNORMAL HIGH (ref 26.0–34.0)
MCV: 102 fL — ABNORMAL HIGH (ref 80–100)
Monocyte #: 0.5 x10 3/mm (ref 0.2–0.9)
Neutrophil %: 60.9 %
RBC: 3.34 10*6/uL — ABNORMAL LOW (ref 3.80–5.20)
RDW: 14.9 % — ABNORMAL HIGH (ref 11.5–14.5)
WBC: 4.5 x10 3/mm (ref 3.6–11.0)

## 2013-06-24 ENCOUNTER — Ambulatory Visit: Payer: Self-pay | Admitting: Oncology

## 2013-06-24 ENCOUNTER — Ambulatory Visit: Payer: Self-pay | Admitting: Internal Medicine

## 2013-06-24 LAB — CBC CANCER CENTER
HGB: 13.2 g/dL (ref 12.0–16.0)
Lymphocyte %: 17.4 %
Monocyte #: 0.5 x10 3/mm (ref 0.2–0.9)
Neutrophil %: 70.7 %
Platelet: 213 x10 3/mm (ref 150–440)
RBC: 3.9 10*6/uL (ref 3.80–5.20)
RDW: 13.9 % (ref 11.5–14.5)

## 2013-07-01 LAB — CBC CANCER CENTER
Basophil #: 0 x10 3/mm (ref 0.0–0.1)
Basophil %: 0.7 %
Eosinophil %: 3.6 %
HCT: 38.5 % (ref 35.0–47.0)
HGB: 13.1 g/dL (ref 12.0–16.0)
Lymphocyte #: 0.8 x10 3/mm — ABNORMAL LOW (ref 1.0–3.6)
MCH: 34.2 pg — ABNORMAL HIGH (ref 26.0–34.0)
Monocyte #: 0.5 x10 3/mm (ref 0.2–0.9)
Monocyte %: 13.1 %
Neutrophil #: 2.2 x10 3/mm (ref 1.4–6.5)
Neutrophil %: 61.7 %
Platelet: 218 x10 3/mm (ref 150–440)

## 2013-07-07 IMAGING — CR DG C-ARM 1-60 MIN
1 series · 1 of 1 positions shown · non-contrast
Comparison: none

[1]
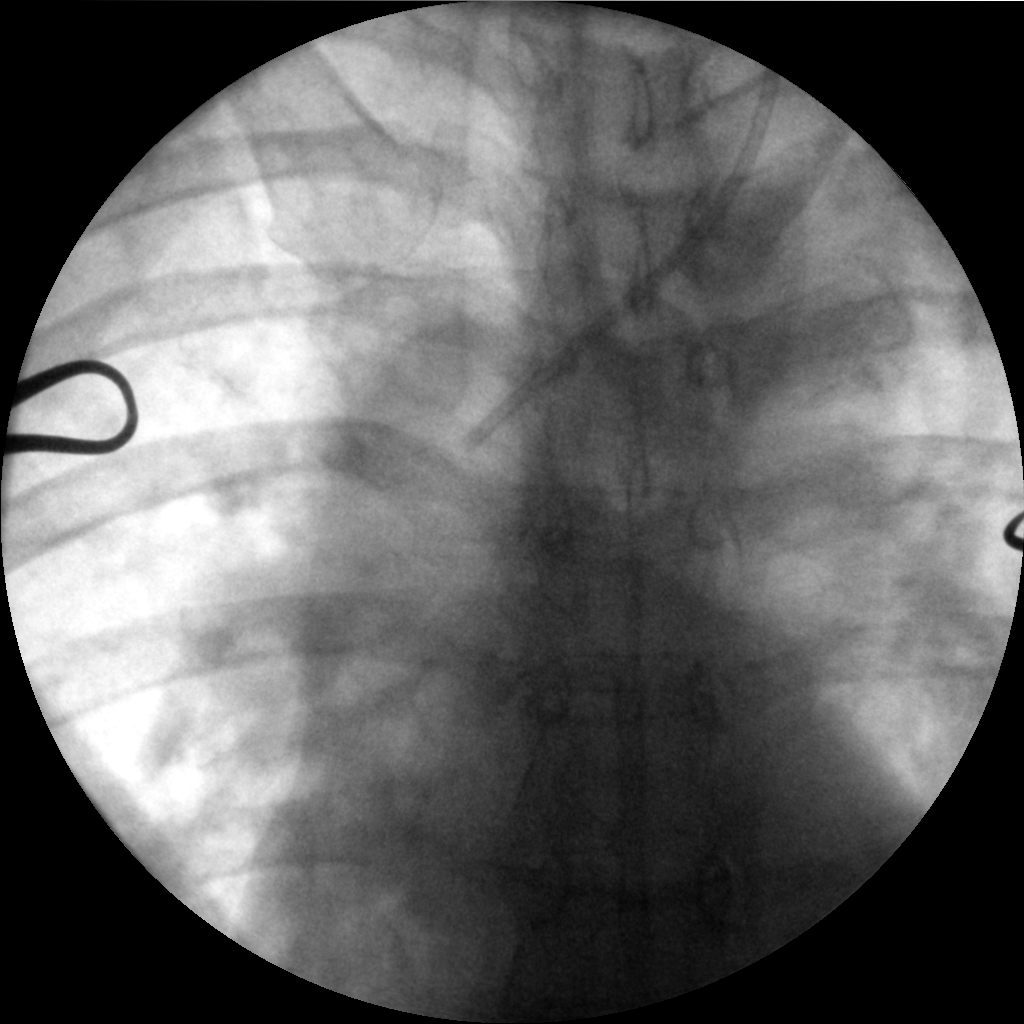

[1 of 1 positions shown; findings below may reference images not displayed]

IMAGES IMPORTED FROM THE SYNGO WORKFLOW SYSTEM
NO DICTATION FOR STUDY

## 2013-07-08 LAB — CBC CANCER CENTER
Basophil %: 1 %
HGB: 13.2 g/dL (ref 12.0–16.0)
Lymphocyte #: 0.6 x10 3/mm — ABNORMAL LOW (ref 1.0–3.6)
Lymphocyte %: 20.7 %
MCH: 34.1 pg — ABNORMAL HIGH (ref 26.0–34.0)
MCV: 100 fL (ref 80–100)
Monocyte #: 0.5 x10 3/mm (ref 0.2–0.9)
Neutrophil #: 1.8 x10 3/mm (ref 1.4–6.5)
Neutrophil %: 58 %
Platelet: 211 x10 3/mm (ref 150–440)
RBC: 3.87 10*6/uL (ref 3.80–5.20)
RDW: 13.5 % (ref 11.5–14.5)

## 2013-07-15 LAB — CBC CANCER CENTER
Basophil %: 0.8 %
Eosinophil #: 0.1 x10 3/mm (ref 0.0–0.7)
Eosinophil %: 2.6 %
HGB: 13.9 g/dL (ref 12.0–16.0)
Lymphocyte #: 0.6 x10 3/mm — ABNORMAL LOW (ref 1.0–3.6)
Lymphocyte %: 18.3 %
Monocyte #: 0.3 x10 3/mm (ref 0.2–0.9)
Monocyte %: 9.6 %
Neutrophil %: 68.7 %
Platelet: 232 x10 3/mm (ref 150–440)
RBC: 4.13 10*6/uL (ref 3.80–5.20)
WBC: 3.3 x10 3/mm — ABNORMAL LOW (ref 3.6–11.0)

## 2013-07-22 LAB — CBC CANCER CENTER
Basophil #: 0 x10 3/mm (ref 0.0–0.1)
Eosinophil #: 0.1 x10 3/mm (ref 0.0–0.7)
Eosinophil %: 2.1 %
HCT: 39.9 % (ref 35.0–47.0)
Lymphocyte %: 21.3 %
MCHC: 33.7 g/dL (ref 32.0–36.0)
MCV: 98 fL (ref 80–100)
Monocyte #: 0.4 x10 3/mm (ref 0.2–0.9)
Monocyte %: 12.8 %
Neutrophil %: 63.1 %
RBC: 4.08 10*6/uL (ref 3.80–5.20)
WBC: 2.9 x10 3/mm — ABNORMAL LOW (ref 3.6–11.0)

## 2013-07-25 ENCOUNTER — Ambulatory Visit: Payer: Self-pay | Admitting: Oncology

## 2013-07-25 ENCOUNTER — Ambulatory Visit: Payer: Self-pay | Admitting: Internal Medicine

## 2013-07-29 LAB — CBC CANCER CENTER
Basophil #: 0 x10 3/mm (ref 0.0–0.1)
Basophil %: 0.6 %
HCT: 40.4 % (ref 35.0–47.0)
HGB: 13.6 g/dL (ref 12.0–16.0)
Lymphocyte #: 0.5 x10 3/mm — ABNORMAL LOW (ref 1.0–3.6)
MCH: 32.5 pg (ref 26.0–34.0)
MCHC: 33.6 g/dL (ref 32.0–36.0)
MCV: 97 fL (ref 80–100)
Monocyte #: 0.4 x10 3/mm (ref 0.2–0.9)
Monocyte %: 11.2 %
Neutrophil %: 70 %
Platelet: 220 x10 3/mm (ref 150–440)
RDW: 13.3 % (ref 11.5–14.5)
WBC: 3.3 x10 3/mm — ABNORMAL LOW (ref 3.6–11.0)

## 2013-08-24 ENCOUNTER — Ambulatory Visit: Payer: Self-pay | Admitting: Internal Medicine

## 2013-08-24 ENCOUNTER — Ambulatory Visit: Payer: Self-pay | Admitting: Oncology

## 2013-08-29 ENCOUNTER — Other Ambulatory Visit: Payer: Self-pay | Admitting: Internal Medicine

## 2013-09-24 ENCOUNTER — Ambulatory Visit: Payer: Self-pay

## 2013-09-24 ENCOUNTER — Ambulatory Visit: Payer: Self-pay | Admitting: Internal Medicine

## 2013-10-02 LAB — HM MAMMOGRAPHY: HM MAMMO: NORMAL

## 2013-10-21 ENCOUNTER — Ambulatory Visit: Payer: BC Managed Care – PPO | Admitting: Internal Medicine

## 2013-10-25 ENCOUNTER — Ambulatory Visit: Payer: Self-pay | Admitting: Internal Medicine

## 2013-10-25 ENCOUNTER — Ambulatory Visit: Payer: Self-pay | Admitting: Oncology

## 2013-10-29 ENCOUNTER — Encounter (INDEPENDENT_AMBULATORY_CARE_PROVIDER_SITE_OTHER): Payer: Self-pay

## 2013-10-29 ENCOUNTER — Encounter: Payer: Self-pay | Admitting: Internal Medicine

## 2013-10-29 ENCOUNTER — Ambulatory Visit (INDEPENDENT_AMBULATORY_CARE_PROVIDER_SITE_OTHER): Payer: BC Managed Care – PPO | Admitting: Internal Medicine

## 2013-10-29 VITALS — BP 130/90 | HR 84 | Temp 97.8°F | Ht 65.0 in | Wt 179.0 lb

## 2013-10-29 DIAGNOSIS — I10 Essential (primary) hypertension: Secondary | ICD-10-CM

## 2013-10-29 DIAGNOSIS — F32A Depression, unspecified: Secondary | ICD-10-CM

## 2013-10-29 DIAGNOSIS — F329 Major depressive disorder, single episode, unspecified: Secondary | ICD-10-CM

## 2013-10-29 DIAGNOSIS — C50919 Malignant neoplasm of unspecified site of unspecified female breast: Secondary | ICD-10-CM

## 2013-10-29 DIAGNOSIS — F3289 Other specified depressive episodes: Secondary | ICD-10-CM

## 2013-10-29 DIAGNOSIS — C50911 Malignant neoplasm of unspecified site of right female breast: Secondary | ICD-10-CM

## 2013-10-29 NOTE — Assessment & Plan Note (Signed)
Symptoms of depression well controlled on current medications. Continue follow up with Dr. Nicolasa Ducking and Dr. Nelva Bush.

## 2013-10-29 NOTE — Progress Notes (Signed)
Subjective:    Patient ID: Danielle Sparks, female    DOB: Apr 25, 1961, 53 y.o.   MRN: [id number]  HPI 53YO female with h/o hypertension, depression, and breast cancer presents for follow up.  Recent mammogram was reportedly normal. Follow up scheduled with Dr. Jeb Levering in 1 week.  Followed by Dr. Nicolasa Ducking and Dr. Nelva Bush. Symptoms of depression well controlled on current medications. No side effects noted. 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  BP has been well controlled during recent months of chemotherapy. Compliant with medications.    Review of Systems  Constitutional: Negative for fever, chills, appetite change, fatigue and unexpected weight change.  HENT: Negative for congestion, ear pain, sinus pressure, sore throat, trouble swallowing and voice change.   Eyes: Negative for visual disturbance.  Respiratory: Negative for cough, shortness of breath, wheezing and stridor.   Cardiovascular: Negative for chest pain, palpitations and leg swelling.  Gastrointestinal: Negative for nausea, vomiting, abdominal pain, diarrhea, constipation, blood in stool, abdominal distention and anal bleeding.  Genitourinary: Negative for dysuria and flank pain.  Musculoskeletal: Negative for arthralgias, gait problem, myalgias and neck pain.  Skin: Negative for color change and rash.  Neurological: Negative for dizziness and headaches.  Hematological: Negative for adenopathy. Does not bruise/bleed easily.  Psychiatric/Behavioral: Negative for suicidal ideas, sleep disturbance and dysphoric mood. The patient is not nervous/anxious.        Objective:    BP 130/90  Pulse 84  Temp(Src) 97.8 F (36.6 C) (Oral)  Ht 5\' 5"  (1.651 m)  Wt 179 lb (81.194 kg)  BMI 29.79 kg/m2  SpO2 97% Physical Exam  Constitutional: She is oriented to person, place, and time. She appears well-developed and well-nourished. No distress.  HENT:  Head: Normocephalic and atraumatic.  Right Ear: External ear  normal.  Left Ear: External ear normal.  Nose: Nose normal.  Mouth/Throat: Oropharynx is clear and moist. No oropharyngeal exudate.  Eyes: Conjunctivae are normal. Pupils are equal, round, and reactive to light. Right eye exhibits no discharge. Left eye exhibits no discharge. No scleral icterus.  Neck: Normal range of motion. Neck supple. No tracheal deviation present. No thyromegaly present.  Cardiovascular: Normal rate, regular rhythm, normal heart sounds and intact distal pulses.  Exam reveals no gallop and no friction rub.   No murmur heard. Pulmonary/Chest: Effort normal and breath sounds normal. No accessory muscle usage. Not tachypneic. No respiratory distress. She has no decreased breath sounds. She has no wheezes. She has no rhonchi. She has no rales. She exhibits no tenderness.  Musculoskeletal: Normal range of motion. She exhibits no edema and no tenderness.  Lymphadenopathy:    She has no cervical adenopathy.  Neurological: She is alert and oriented to person, place, and time. No cranial nerve deficit. She exhibits normal muscle tone. Coordination normal.  Skin: Skin is warm and dry. No rash noted. She is not diaphoretic. No erythema. No pallor.  Psychiatric: She has a normal mood and affect. Her behavior is normal. Judgment and thought content normal.          Assessment & Plan:   Problem List Items Addressed This Visit   Breast cancer, right breast     Completed treatment. On Femara. Will get recent notes from oncologist.    Relevant Medications      letrozole (Christie) 2.5 MG tablet   Depression     Symptoms of depression well controlled on current medications. Continue follow up with Dr. Nicolasa Ducking and Dr. Nelva Bush.  Hypertension - Primary      BP Readings from Last 3 Encounters:  10/29/13 130/90  04/17/13 130/90  01/12/13 138/106   BP has been relatively well controlled and is monitored by pt oncologist. Slightly elevated today. Will continue to monitor for now. If  persistently elevated, consider adding Amlodipine 2.5mg  daily.        Return in about 6 months (around 04/28/2014) for Physical.

## 2013-10-29 NOTE — Assessment & Plan Note (Signed)
BP Readings from Last 3 Encounters:  10/29/13 130/90  04/17/13 130/90  01/12/13 138/106   BP has been relatively well controlled and is monitored by pt oncologist. Slightly elevated today. Will continue to monitor for now. If persistently elevated, consider adding Amlodipine 2.5mg  daily.

## 2013-10-29 NOTE — Progress Notes (Signed)
Pre-visit discussion using our clinic review tool. No additional management support is needed unless otherwise documented below in the visit note.  

## 2013-10-29 NOTE — Assessment & Plan Note (Signed)
Completed treatment. On Femara. Will get recent notes from oncologist.

## 2013-10-30 ENCOUNTER — Telehealth: Payer: Self-pay | Admitting: Internal Medicine

## 2013-10-30 NOTE — Telephone Encounter (Signed)
Relevant patient education assigned to patient using Emmi. ° °

## 2013-11-11 LAB — CBC CANCER CENTER
BASOS PCT: 1.2 %
Basophil #: 0.1 x10 3/mm (ref 0.0–0.1)
Eosinophil #: 0.1 x10 3/mm (ref 0.0–0.7)
Eosinophil %: 2.2 %
HCT: 38 % (ref 35.0–47.0)
HGB: 12.8 g/dL (ref 12.0–16.0)
LYMPHS ABS: 1.1 x10 3/mm (ref 1.0–3.6)
Lymphocyte %: 23.6 %
MCH: 31.6 pg (ref 26.0–34.0)
MCHC: 33.6 g/dL (ref 32.0–36.0)
MCV: 94 fL (ref 80–100)
MONO ABS: 0.4 x10 3/mm (ref 0.2–0.9)
Monocyte %: 8.8 %
Neutrophil #: 3 x10 3/mm (ref 1.4–6.5)
Neutrophil %: 64.2 %
Platelet: 255 x10 3/mm (ref 150–440)
RBC: 4.05 10*6/uL (ref 3.80–5.20)
RDW: 13.7 % (ref 11.5–14.5)
WBC: 4.7 x10 3/mm (ref 3.6–11.0)

## 2013-11-11 LAB — COMPREHENSIVE METABOLIC PANEL
ALBUMIN: 3.8 g/dL (ref 3.4–5.0)
ALK PHOS: 81 U/L
ANION GAP: 11 (ref 7–16)
BILIRUBIN TOTAL: 0.2 mg/dL (ref 0.2–1.0)
BUN: 12 mg/dL (ref 7–18)
CALCIUM: 8 mg/dL — AB (ref 8.5–10.1)
Chloride: 99 mmol/L (ref 98–107)
Co2: 30 mmol/L (ref 21–32)
Creatinine: 0.98 mg/dL (ref 0.60–1.30)
EGFR (African American): >60
EGFR (Non-African Amer.): >60
GLUCOSE: 83 mg/dL (ref 65–99)
Osmolality: 278 (ref 275–301)
Potassium: 3.8 mmol/L (ref 3.5–5.1)
SGOT(AST): 18 U/L (ref 15–37)
SGPT (ALT): 34 U/L (ref 12–78)
SODIUM: 140 mmol/L (ref 136–145)
TOTAL PROTEIN: 6.7 g/dL (ref 6.4–8.2)

## 2013-11-11 LAB — LIPID PANEL
CHOLESTEROL: 214 mg/dL — AB (ref 0–200)
HDL: 42 mg/dL (ref 40–60)
LDL CHOLESTEROL, CALC: 115 mg/dL — AB (ref 0–100)
TRIGLYCERIDES: 284 mg/dL — AB (ref 0–200)
VLDL CHOLESTEROL, CALC: 57 mg/dL — AB (ref 5–40)

## 2013-11-12 LAB — CANCER ANTIGEN 27.29: CA 27.29: 20.8 U/mL (ref 0.0–38.6)

## 2013-11-22 ENCOUNTER — Ambulatory Visit: Payer: Self-pay | Admitting: Internal Medicine

## 2013-11-22 ENCOUNTER — Ambulatory Visit: Payer: Self-pay | Admitting: Oncology

## 2013-11-23 ENCOUNTER — Encounter: Payer: Self-pay | Admitting: Internal Medicine

## 2013-11-30 ENCOUNTER — Other Ambulatory Visit: Payer: Self-pay | Admitting: *Deleted

## 2013-11-30 MED ORDER — HYDROCHLOROTHIAZIDE 25 MG PO TABS: ORAL_TABLET | ORAL | Status: DC

## 2014-01-04 ENCOUNTER — Ambulatory Visit: Payer: Self-pay | Admitting: Oncology

## 2014-01-22 ENCOUNTER — Ambulatory Visit: Payer: Self-pay | Admitting: Oncology

## 2014-02-22 ENCOUNTER — Ambulatory Visit: Payer: Self-pay | Admitting: Oncology

## 2014-03-10 ENCOUNTER — Telehealth: Payer: Self-pay | Admitting: Internal Medicine

## 2014-03-10 LAB — CBC CANCER CENTER
BASOS ABS: 0 x10 3/mm (ref 0.0–0.1)
Basophil %: 0.7 %
EOS ABS: 0.1 x10 3/mm (ref 0.0–0.7)
Eosinophil %: 1.6 %
HCT: 36.9 % (ref 35.0–47.0)
HGB: 12.4 g/dL (ref 12.0–16.0)
Lymphocyte #: 1 x10 3/mm (ref 1.0–3.6)
Lymphocyte %: 18 %
MCH: 32.1 pg (ref 26.0–34.0)
MCHC: 33.7 g/dL (ref 32.0–36.0)
MCV: 95 fL (ref 80–100)
Monocyte #: 0.4 x10 3/mm (ref 0.2–0.9)
Monocyte %: 7.4 %
NEUTROS PCT: 72.3 %
Neutrophil #: 4.2 x10 3/mm (ref 1.4–6.5)
PLATELETS: 227 x10 3/mm (ref 150–440)
RBC: 3.87 10*6/uL (ref 3.80–5.20)
RDW: 13.3 % (ref 11.5–14.5)
WBC: 5.8 x10 3/mm (ref 3.6–11.0)

## 2014-03-10 NOTE — Telephone Encounter (Signed)
The patient is needing a sooner appointment with Dr. Gilford Rile. Her blood pressure has been elevated and Dr. Jeb Levering wants her primary to monitor her BP.

## 2014-03-11 LAB — CANCER ANTIGEN 27.29: CA 27.29: 19.5 U/mL (ref 0.0–38.6)

## 2014-03-11 NOTE — Telephone Encounter (Signed)
Please advise if we may over book.  Thank you

## 2014-03-11 NOTE — Telephone Encounter (Signed)
We can add her at 4pm on Friday

## 2014-03-11 NOTE — Telephone Encounter (Signed)
Pt states that she is unable to come in tomorrow at 4.  Is it okay to schedule her to come in the week after next to see you or should we see if another provider might have something available next week?

## 2014-03-11 NOTE — Telephone Encounter (Signed)
Pt agreed to see R. Rey

## 2014-03-11 NOTE — Telephone Encounter (Signed)
We could have her see Danielle Sparks next week. Just let her know that I am out at a meeting.

## 2014-03-17 ENCOUNTER — Ambulatory Visit (INDEPENDENT_AMBULATORY_CARE_PROVIDER_SITE_OTHER): Payer: BC Managed Care – PPO | Admitting: Adult Health

## 2014-03-17 ENCOUNTER — Encounter: Payer: Self-pay | Admitting: Adult Health

## 2014-03-17 VITALS — BP 133/87 | HR 92 | Temp 98.2°F | Resp 14 | Wt 184.0 lb

## 2014-03-17 DIAGNOSIS — I10 Essential (primary) hypertension: Secondary | ICD-10-CM

## 2014-03-17 MED ORDER — LISINOPRIL 5 MG PO TABS
5.0000 mg | ORAL_TABLET | Freq: Every day | ORAL | Status: DC
Start: 2014-03-17 — End: 2014-03-29

## 2014-03-17 NOTE — Progress Notes (Signed)
Patient ID: Danielle Sparks, female   DOB: 16-Jun-1961, 53 y.o.   MRN: [id number]   Subjective:    Patient ID: Danielle Sparks, female    DOB: [birth year], 53 y.o.   MRN: [id number]  HPI  Pt is a pleasant 53 year old female who presents to clinic with concerns of rising blood pressure. She is status post chemotherapy and radiation for breast cancer. Completed all treatment in November. Followed by Dr. Oliva Bustard. She is also being followed by Dr. Nicolasa Ducking for depression and anxiety. Initially thought that Effexor maybe causing the rise in blood pressure. Dr. Nicolasa Ducking tried to lower her dose; however, patient did not do well. She is currently taking 150 mg of Effexor daily. Patient has also gained weight which may also be contributing to elevated blood pressure. Not currently exercising.    Past Medical History  Diagnosis Date  . Other vitamin B12 deficiency anemia   . Postmenopausal atrophic vaginitis   . Bunion   . Depression   . Other vitamin B12 deficiency anemia   . Cancer     breast    Current Outpatient Prescriptions on File Prior to Visit  Medication Sig Dispense Refill  . buPROPion (WELLBUTRIN) 75 MG tablet Take 150 mg by mouth 2 (two) times daily.       . hydrochlorothiazide (HYDRODIURIL) 25 MG tablet TAKE 1 TABLET DAILY  90 tablet  1  . letrozole (FEMARA) 2.5 MG tablet Take 2.5 mg by mouth daily.      Marland Kitchen venlafaxine (EFFEXOR) 75 MG tablet Take 150 mg by mouth daily.        No current facility-administered medications on file prior to visit.     Review of Systems  Constitutional:       Weight gain. Not exercising.  HENT: Negative.   Eyes: Negative.   Respiratory: Negative.   Cardiovascular: Negative.   Gastrointestinal: Negative.   Endocrine: Negative.   Genitourinary: Negative.   Musculoskeletal: Negative.   Skin: Negative.   Allergic/Immunologic: Negative.   Neurological: Negative.   Hematological: Negative.   Psychiatric/Behavioral: Negative.        Depression and anxiety  followed by Dr. Nicolasa Ducking       Objective:  BP 133/87  Pulse 92  Temp(Src) 98.2 F (36.8 C) (Oral)  Resp 14  Wt 184 lb (83.462 kg)  SpO2 100%   Physical Exam  Constitutional: She is oriented to person, place, and time. No distress.  HENT:  Head: Normocephalic and atraumatic.  Eyes: Conjunctivae and EOM are normal.  Neck: Normal range of motion. Neck supple.  Cardiovascular: Normal rate, regular rhythm, normal heart sounds and intact distal pulses.  Exam reveals no gallop and no friction rub.   No murmur heard. Pulmonary/Chest: Effort normal and breath sounds normal. No respiratory distress. She has no wheezes. She has no rales.  Musculoskeletal: Normal range of motion.  Neurological: She is alert and oriented to person, place, and time. She has normal reflexes. Coordination normal.  Skin: Skin is warm and dry.  Psychiatric: She has a normal mood and affect. Her behavior is normal. Judgment and thought content normal.      Assessment & Plan:   1. Essential hypertension She is currently on HCTZ 25 mg daily. Start lisinopril 5 mg daily. Check bmet. She will return in 1 week for follow up labs and return in 2 weeks for follow up HTN. She will monitor her b/p daily and bring record with her for adjustment of medication.  - Basic metabolic  panel

## 2014-03-17 NOTE — Progress Notes (Signed)
Pre visit review using our clinic review tool, if applicable. No additional management support is needed unless otherwise documented below in the visit note. 

## 2014-03-17 NOTE — Patient Instructions (Signed)
  Please have labs drawn prior to leaving the office.  I am starting you on lisinopril 5 mg daily. Please monitor your blood pressure daily and record. Follow up with Dr. Gilford Rile in 2 weeks and bring readings with you.  Please return in 1 week for repeat labs. I need to check your potassium and kidney function.  Gradually begin exercising. Walking is a great way to begin. Exercising will help you feel better overall.  Call with any questions or concerns.

## 2014-03-18 LAB — BASIC METABOLIC PANEL
BUN: 13 mg/dL (ref 6–23)
CALCIUM: 9.8 mg/dL (ref 8.4–10.5)
CO2: 31 meq/L (ref 19–32)
CREATININE: 0.8 mg/dL (ref 0.4–1.2)
Chloride: 100 mEq/L (ref 96–112)
GFR: 83.96 mL/min (ref >60.00)
GLUCOSE: 55 mg/dL — AB (ref 70–99)
Potassium: 4.1 mEq/L (ref 3.5–5.1)
Sodium: 140 mEq/L (ref 135–145)

## 2014-03-24 ENCOUNTER — Other Ambulatory Visit (INDEPENDENT_AMBULATORY_CARE_PROVIDER_SITE_OTHER): Payer: BC Managed Care – PPO

## 2014-03-24 ENCOUNTER — Ambulatory Visit: Payer: Self-pay | Admitting: Oncology

## 2014-03-24 ENCOUNTER — Other Ambulatory Visit: Payer: BC Managed Care – PPO

## 2014-03-24 DIAGNOSIS — I10 Essential (primary) hypertension: Secondary | ICD-10-CM

## 2014-03-24 DIAGNOSIS — D72819 Decreased white blood cell count, unspecified: Secondary | ICD-10-CM

## 2014-03-24 DIAGNOSIS — E785 Hyperlipidemia, unspecified: Secondary | ICD-10-CM

## 2014-03-24 DIAGNOSIS — Z79899 Other long term (current) drug therapy: Secondary | ICD-10-CM

## 2014-03-25 ENCOUNTER — Telehealth: Payer: Self-pay | Admitting: Radiology

## 2014-03-25 LAB — COMPREHENSIVE METABOLIC PANEL
ALBUMIN: 4.6 g/dL (ref 3.5–5.2)
ALT: 26 U/L (ref 0–35)
AST: 25 U/L (ref 0–37)
Alkaline Phosphatase: 66 U/L (ref 39–117)
BUN: 10 mg/dL (ref 6–23)
CO2: 29 mEq/L (ref 19–32)
Calcium: 9.8 mg/dL (ref 8.4–10.5)
Chloride: 98 mEq/L (ref 96–112)
Creatinine, Ser: 0.7 mg/dL (ref 0.4–1.2)
GFR: 86.58 mL/min (ref >60.00)
Glucose, Bld: 95 mg/dL (ref 70–99)
Potassium: 4.1 mEq/L (ref 3.5–5.1)
SODIUM: 137 meq/L (ref 135–145)
Total Bilirubin: 0.5 mg/dL (ref 0.2–1.2)
Total Protein: 7.3 g/dL (ref 6.0–8.3)

## 2014-03-25 NOTE — Telephone Encounter (Signed)
Message copied by Ellamae Sia on Thu Mar 25, 2014 12:20 PM ------      Message from: Ronette Deter A      Created: Thu Mar 25, 2014  9:22 AM      Regarding: RE: Lab orders?       Sure. She saw Raquel last week and she needs a repeat CMP because she was started on new medication.      Thanks for your help!      Delsa Sale      ----- Message -----         From: Ellamae Sia         Sent: 03/24/2014   6:14 PM           To: Jackolyn Confer, MD      Subject: Lab orders?                                              Hi Dr Gilford Rile, This is Crandall Harvel from [facility]. This patient was sent here today to have her blood drawn. Can you please clarify what orders you wanted. Her orders are a mess, I will cancel all duplicates once you let me know. She is not fasting and didn't want the Lipid panel done. Thank You, Kanijah Groseclose       ------

## 2014-03-29 ENCOUNTER — Other Ambulatory Visit: Payer: Self-pay | Admitting: *Deleted

## 2014-03-29 ENCOUNTER — Telehealth: Payer: Self-pay | Admitting: Internal Medicine

## 2014-03-29 MED ORDER — LOSARTAN POTASSIUM 25 MG PO TABS: 25.0000 mg | ORAL_TABLET | Freq: Every day | ORAL | Status: DC

## 2014-03-29 NOTE — Telephone Encounter (Signed)
OK. We discussed at her visit. Have her STOP Lisinopril. And, start Losartan 25mg  daily, #30 with 3 refills. Will need to check a BMP next week.

## 2014-03-29 NOTE — Telephone Encounter (Signed)
Notified pt and sent Rx. 

## 2014-03-29 NOTE — Telephone Encounter (Signed)
Pt left vm.  States Dr. Gilford Rile told her to call if she continued to have cough with lisinopril and she would call her in a different BP med.  States she would like a call regarding this or just send in new med to pharmacy at [place].

## 2014-04-09 ENCOUNTER — Ambulatory Visit: Payer: BC Managed Care – PPO | Admitting: Internal Medicine

## 2014-04-24 ENCOUNTER — Ambulatory Visit: Payer: Self-pay | Admitting: Oncology

## 2014-04-29 ENCOUNTER — Encounter: Payer: BC Managed Care – PPO | Admitting: Internal Medicine

## 2014-05-19 ENCOUNTER — Ambulatory Visit: Payer: BC Managed Care – PPO | Admitting: Internal Medicine

## 2014-05-27 ENCOUNTER — Ambulatory Visit: Payer: BC Managed Care – PPO | Admitting: Internal Medicine

## 2014-06-03 ENCOUNTER — Ambulatory Visit (INDEPENDENT_AMBULATORY_CARE_PROVIDER_SITE_OTHER): Payer: BC Managed Care – PPO | Admitting: Internal Medicine

## 2014-06-03 ENCOUNTER — Encounter: Payer: Self-pay | Admitting: Internal Medicine

## 2014-06-03 ENCOUNTER — Other Ambulatory Visit: Payer: Self-pay | Admitting: Internal Medicine

## 2014-06-03 VITALS — BP 120/80 | HR 84 | Temp 98.1°F | Ht 65.0 in | Wt 181.8 lb

## 2014-06-03 DIAGNOSIS — E538 Deficiency of other specified B group vitamins: Secondary | ICD-10-CM

## 2014-06-03 DIAGNOSIS — F3289 Other specified depressive episodes: Secondary | ICD-10-CM

## 2014-06-03 DIAGNOSIS — D510 Vitamin B12 deficiency anemia due to intrinsic factor deficiency: Secondary | ICD-10-CM

## 2014-06-03 DIAGNOSIS — F329 Major depressive disorder, single episode, unspecified: Secondary | ICD-10-CM

## 2014-06-03 DIAGNOSIS — F32A Depression, unspecified: Secondary | ICD-10-CM

## 2014-06-03 DIAGNOSIS — I10 Essential (primary) hypertension: Secondary | ICD-10-CM

## 2014-06-03 LAB — CBC WITH DIFFERENTIAL/PLATELET
Basophils Absolute: 0 10*3/uL (ref 0.0–0.1)
Basophils Relative: 0.6 % (ref 0.0–3.0)
Eosinophils Absolute: 0.1 10*3/uL (ref 0.0–0.7)
Eosinophils Relative: 1.8 % (ref 0.0–5.0)
HCT: 39.2 % (ref 36.0–46.0)
Hemoglobin: 13.3 g/dL (ref 12.0–15.0)
Lymphocytes Relative: 27.2 % (ref 12.0–46.0)
Lymphs Abs: 1.2 10*3/uL (ref 0.7–4.0)
MCHC: 34 g/dL (ref 30.0–36.0)
MCV: 94.7 fl (ref 78.0–100.0)
MONO ABS: 0.4 10*3/uL (ref 0.1–1.0)
Monocytes Relative: 8.9 % (ref 3.0–12.0)
NEUTROS ABS: 2.6 10*3/uL (ref 1.4–7.7)
NEUTROS PCT: 61.5 % (ref 43.0–77.0)
Platelets: 263 10*3/uL (ref 150.0–400.0)
RBC: 4.14 Mil/uL (ref 3.87–5.11)
RDW: 13.4 % (ref 11.5–15.5)
WBC: 4.3 10*3/uL (ref 4.0–10.5)

## 2014-06-03 LAB — COMPREHENSIVE METABOLIC PANEL
ALT: 53 U/L — ABNORMAL HIGH (ref 0–35)
AST: 37 U/L (ref 0–37)
Albumin: 4.3 g/dL (ref 3.5–5.2)
Alkaline Phosphatase: 74 U/L (ref 39–117)
BILIRUBIN TOTAL: 0.5 mg/dL (ref 0.2–1.2)
BUN: 13 mg/dL (ref 6–23)
CO2: 28 meq/L (ref 19–32)
CREATININE: 0.8 mg/dL (ref 0.4–1.2)
Calcium: 10.2 mg/dL (ref 8.4–10.5)
Chloride: 101 mEq/L (ref 96–112)
GFR: 77.95 mL/min (ref >60.00)
GLUCOSE: 89 mg/dL (ref 70–99)
Potassium: 4 mEq/L (ref 3.5–5.1)
Sodium: 138 mEq/L (ref 135–145)
Total Protein: 7.2 g/dL (ref 6.0–8.3)

## 2014-06-03 LAB — LIPID PANEL
CHOLESTEROL: 236 mg/dL — AB (ref 0–200)
HDL: 54.6 mg/dL (ref >39.00)
LDL Cholesterol: 160 mg/dL — ABNORMAL HIGH (ref 0–99)
NONHDL: 181.4
Total CHOL/HDL Ratio: 4
Triglycerides: 106 mg/dL (ref 0.0–149.0)
VLDL: 21.2 mg/dL (ref 0.0–40.0)

## 2014-06-03 LAB — VITAMIN B12: Vitamin B-12: 361 pg/mL (ref 211–911)

## 2014-06-03 MED ORDER — CYANOCOBALAMIN 1000 MCG/ML IJ SOLN
1000.0000 ug | INTRAMUSCULAR | Status: DC
Start: 2014-06-03 — End: 2015-07-13

## 2014-06-03 NOTE — Progress Notes (Signed)
Subjective:    Patient ID: Danielle Sparks, female    DOB: Nov 24, 1960, 53 y.o.   MRN: [id number]  HPI 53YO female presents for follow up.  Started back on Abilify by Dr. Nicolasa Ducking. Some improvement with this. Feels like a delay in responses at time on this medication. Feels that she "lost herself" during cancer treatment. Trying to get back on track with counseling, including counseling with her husband. Physically, feeling well.  Review of Systems  Constitutional: Negative for fever, chills, appetite change, fatigue and unexpected weight change.  Eyes: Negative for visual disturbance.  Respiratory: Negative for shortness of breath.   Cardiovascular: Negative for chest pain and leg swelling.  Gastrointestinal: Negative for abdominal pain, diarrhea and constipation.  Musculoskeletal: Negative for arthralgias.  Skin: Negative for color change and rash.  Hematological: Negative for adenopathy. Does not bruise/bleed easily.  Psychiatric/Behavioral: Positive for dysphoric mood. Negative for suicidal ideas, sleep disturbance and self-injury. The patient is not nervous/anxious.        Objective:    BP 120/80  Pulse 84  Temp(Src) 98.1 F (36.7 C) (Oral)  Ht 5\' 5"  (1.651 m)  Wt 181 lb 12 oz (82.441 kg)  BMI 30.24 kg/m2  SpO2 99% Physical Exam  Constitutional: She is oriented to person, place, and time. She appears well-developed and well-nourished. No distress.  HENT:  Head: Normocephalic and atraumatic.  Right Ear: External ear normal.  Left Ear: External ear normal.  Nose: Nose normal.  Mouth/Throat: Oropharynx is clear and moist. No oropharyngeal exudate.  Eyes: Conjunctivae are normal. Pupils are equal, round, and reactive to light. Right eye exhibits no discharge. Left eye exhibits no discharge. No scleral icterus.  Neck: Normal range of motion. Neck supple. No tracheal deviation present. No thyromegaly present.  Cardiovascular: Normal rate, regular rhythm, normal heart sounds  and intact distal pulses.  Exam reveals no gallop and no friction rub.   No murmur heard. Pulmonary/Chest: Effort normal and breath sounds normal. No accessory muscle usage. Not tachypneic. No respiratory distress. She has no decreased breath sounds. She has no wheezes. She has no rhonchi. She has no rales. She exhibits no tenderness.  Musculoskeletal: Normal range of motion. She exhibits no edema and no tenderness.  Lymphadenopathy:    She has no cervical adenopathy.  Neurological: She is alert and oriented to person, place, and time. No cranial nerve deficit. She exhibits normal muscle tone. Coordination normal.  Skin: Skin is warm and dry. No rash noted. She is not diaphoretic. No erythema. No pallor.  Psychiatric: Her speech is normal and behavior is normal. Judgment and thought content normal. Cognition and memory are normal. She exhibits a depressed mood. She expresses no suicidal ideation.          Assessment & Plan:   Problem List Items Addressed This Visit     Unprioritized   Depression     Recent worsening of symptoms. Started back on Abilify by Dr. Nicolasa Ducking. Continues on Effexor and Wellbutrin. Continues counseling. Will continue to monitor.    Hypertension - Primary      BP Readings from Last 3 Encounters:  06/03/14 120/80  03/17/14 133/87  10/29/13 130/90   BP well controlled on Losartan. Renal function recently normal.     Relevant Orders      Lipid panel      Comprehensive metabolic panel    Other Visit Diagnoses   Pernicious anemia        Relevant Orders  CBC w/Diff       B12        Return in about 3 months (around 09/02/2014) for Physical.

## 2014-06-03 NOTE — Progress Notes (Signed)
Pre visit review using our clinic review tool, if applicable. No additional management support is needed unless otherwise documented below in the visit note. 

## 2014-06-03 NOTE — Assessment & Plan Note (Signed)
Recent worsening of symptoms. Started back on Abilify by Dr. Nicolasa Ducking. Continues on Effexor and Wellbutrin. Continues counseling. Will continue to monitor.

## 2014-06-03 NOTE — Assessment & Plan Note (Signed)
BP Readings from Last 3 Encounters:  06/03/14 120/80  03/17/14 133/87  10/29/13 130/90   BP well controlled on Losartan. Renal function recently normal.

## 2014-06-03 NOTE — Patient Instructions (Signed)
Labs today.   Follow up in 3 months.  

## 2014-06-04 ENCOUNTER — Other Ambulatory Visit: Payer: Self-pay | Admitting: Internal Medicine

## 2014-08-11 ENCOUNTER — Other Ambulatory Visit: Payer: Self-pay | Admitting: Internal Medicine

## 2014-08-30 ENCOUNTER — Ambulatory Visit: Payer: Self-pay | Admitting: Oncology

## 2014-08-30 LAB — COMPREHENSIVE METABOLIC PANEL
ALBUMIN: 4.2 g/dL (ref 3.4–5.0)
Alkaline Phosphatase: 81 U/L
Anion Gap: 9 (ref 7–16)
BUN: 13 mg/dL (ref 7–18)
Bilirubin,Total: 0.6 mg/dL (ref 0.2–1.0)
CALCIUM: 9.9 mg/dL (ref 8.5–10.1)
CREATININE: 0.85 mg/dL (ref 0.60–1.30)
Chloride: 98 mmol/L (ref 98–107)
Co2: 29 mmol/L (ref 21–32)
EGFR (African American): >60
EGFR (Non-African Amer.): >60
GLUCOSE: 115 mg/dL — AB (ref 65–99)
OSMOLALITY: 273 (ref 275–301)
Potassium: 3.9 mmol/L (ref 3.5–5.1)
SGOT(AST): 21 U/L (ref 15–37)
SGPT (ALT): 31 U/L
Sodium: 136 mmol/L (ref 136–145)
Total Protein: 7.1 g/dL (ref 6.4–8.2)

## 2014-08-30 LAB — CBC CANCER CENTER
BASOS ABS: 0 x10 3/mm (ref 0.0–0.1)
Basophil %: 0.7 %
EOS ABS: 0.1 x10 3/mm (ref 0.0–0.7)
EOS PCT: 1.6 %
HCT: 41.9 % (ref 35.0–47.0)
HGB: 14.4 g/dL (ref 12.0–16.0)
LYMPHS PCT: 24.3 %
Lymphocyte #: 1.2 x10 3/mm (ref 1.0–3.6)
MCH: 32.4 pg (ref 26.0–34.0)
MCHC: 34.3 g/dL (ref 32.0–36.0)
MCV: 95 fL (ref 80–100)
MONO ABS: 0.3 x10 3/mm (ref 0.2–0.9)
Monocyte %: 6.6 %
NEUTROS ABS: 3.4 x10 3/mm (ref 1.4–6.5)
Neutrophil %: 66.8 %
Platelet: 219 x10 3/mm (ref 150–440)
RBC: 4.42 10*6/uL (ref 3.80–5.20)
RDW: 13.3 % (ref 11.5–14.5)
WBC: 5.7 x10 3/mm (ref 3.6–11.0)

## 2014-08-31 LAB — CANCER ANTIGEN 27.29: CA 27.29: 17.5 U/mL (ref 0.0–38.6)

## 2014-09-01 ENCOUNTER — Encounter: Payer: Self-pay | Admitting: Internal Medicine

## 2014-09-01 ENCOUNTER — Ambulatory Visit (INDEPENDENT_AMBULATORY_CARE_PROVIDER_SITE_OTHER): Payer: BC Managed Care – PPO | Admitting: Internal Medicine

## 2014-09-01 VITALS — BP 128/92 | HR 72 | Temp 98.1°F | Ht 66.25 in | Wt 188.0 lb

## 2014-09-01 DIAGNOSIS — Z0000 Encounter for general adult medical examination without abnormal findings: Secondary | ICD-10-CM

## 2014-09-01 MED ORDER — CLONAZEPAM 0.5 MG PO TABS: 0.5000 mg | ORAL_TABLET | Freq: Two times a day (BID) | ORAL | Status: DC | PRN

## 2014-09-01 NOTE — Patient Instructions (Signed)

## 2014-09-01 NOTE — Assessment & Plan Note (Signed)
General medical exam including breast exam normal except as noted. PAP and pelvic deferred as PAP normal 2013, and pt s/p hysterectomy. Mammogram pending for Jan 2016. Flu vaccine declined. Reviewed recent labs from [facility]. Encouraged healthy diet and exercise.

## 2014-09-01 NOTE — Progress Notes (Signed)
Pre visit review using our clinic review tool, if applicable. No additional management support is needed unless otherwise documented below in the visit note. 

## 2014-09-01 NOTE — Progress Notes (Signed)
   Subjective:    Patient ID: Danielle Sparks, female    DOB: 1961/07/30, 53 y.o.   MRN: [id number]  HPI  53YO female presents for physical exam.  Feeling well. No concerns today.Recently seen by her oncologist. Scheduled for mammogram in January.  Past medical, surgical, family and social history per today's encounter.  Review of Systems  Constitutional: Negative for fever, chills, appetite change, fatigue and unexpected weight change.  Eyes: Negative for visual disturbance.  Respiratory: Negative for shortness of breath.   Cardiovascular: Negative for chest pain and leg swelling.  Gastrointestinal: Negative for nausea, vomiting, abdominal pain, diarrhea and constipation.  Musculoskeletal: Negative for myalgias and arthralgias.  Skin: Negative for color change and rash.  Hematological: Negative for adenopathy. Does not bruise/bleed easily.  Psychiatric/Behavioral: Negative for sleep disturbance and dysphoric mood. The patient is nervous/anxious.        Objective:    BP 128/92 mmHg  Pulse 72  Temp(Src) 98.1 F (36.7 C) (Oral)  Ht 5' 6.25" (1.683 m)  Wt 188 lb (85.276 kg)  BMI 30.11 kg/m2  SpO2 99% Physical Exam  Constitutional: She is oriented to person, place, and time. She appears well-developed and well-nourished. No distress.  HENT:  Head: Normocephalic and atraumatic.  Right Ear: External ear normal.  Left Ear: External ear normal.  Nose: Nose normal.  Mouth/Throat: Oropharynx is clear and moist. No oropharyngeal exudate.  Eyes: Conjunctivae are normal. Pupils are equal, round, and reactive to light. Right eye exhibits no discharge. Left eye exhibits no discharge. No scleral icterus.  Neck: Normal range of motion. Neck supple. No tracheal deviation present. No thyromegaly present.  Cardiovascular: Normal rate, regular rhythm, normal heart sounds and intact distal pulses.  Exam reveals no gallop and no friction rub.   No murmur heard. Pulmonary/Chest: Effort normal  and breath sounds normal. No accessory muscle usage. No tachypnea. No respiratory distress. She has no decreased breath sounds. She has no wheezes. She has no rales. She exhibits no tenderness. Right breast exhibits no inverted nipple, no mass, no nipple discharge, no skin change and no tenderness. Left breast exhibits no inverted nipple, no mass, no nipple discharge, no skin change and no tenderness. Breasts are symmetrical.    Abdominal: Soft. Bowel sounds are normal. She exhibits no distension and no mass. There is no tenderness. There is no rebound and no guarding.  Musculoskeletal: Normal range of motion. She exhibits no edema or tenderness.  Lymphadenopathy:    She has no cervical adenopathy.  Neurological: She is alert and oriented to person, place, and time. No cranial nerve deficit. She exhibits normal muscle tone. Coordination normal.  Skin: Skin is warm and dry. No rash noted. She is not diaphoretic. No erythema. No pallor.  Psychiatric: She has a normal mood and affect. Her behavior is normal. Judgment and thought content normal.          Assessment & Plan:   Problem List Items Addressed This Visit      Unprioritized   Routine general medical examination at a health care facility - Primary    General medical exam including breast exam normal except as noted. PAP and pelvic deferred as PAP normal 2013, and pt s/p hysterectomy. Mammogram pending for Jan 2016. Flu vaccine declined. Reviewed recent labs from [facility]. Encouraged healthy diet and exercise.        Return in about 6 months (around 03/03/2015) for Recheck.

## 2014-09-07 ENCOUNTER — Other Ambulatory Visit: Payer: Self-pay | Admitting: Oncology

## 2014-09-07 DIAGNOSIS — Z853 Personal history of malignant neoplasm of breast: Secondary | ICD-10-CM

## 2014-09-07 DIAGNOSIS — R928 Other abnormal and inconclusive findings on diagnostic imaging of breast: Secondary | ICD-10-CM

## 2014-09-07 DIAGNOSIS — R9389 Abnormal findings on diagnostic imaging of other specified body structures: Secondary | ICD-10-CM

## 2014-09-15 ENCOUNTER — Ambulatory Visit
Admission: RE | Admit: 2014-09-15 | Discharge: 2014-09-15 | Disposition: A | Discharge status: Home / Self Care | Payer: BC Managed Care – PPO | Source: Ambulatory Visit | Attending: Oncology | Admitting: Oncology

## 2014-09-15 DIAGNOSIS — R9389 Abnormal findings on diagnostic imaging of other specified body structures: Secondary | ICD-10-CM

## 2014-09-15 DIAGNOSIS — Z853 Personal history of malignant neoplasm of breast: Secondary | ICD-10-CM

## 2014-09-15 DIAGNOSIS — R928 Other abnormal and inconclusive findings on diagnostic imaging of breast: Secondary | ICD-10-CM

## 2014-09-15 MED ORDER — GADOBENATE DIMEGLUMINE 529 MG/ML IV SOLN
17.0000 mL | Freq: Once | INTRAVENOUS | Status: AC | PRN
  Administered 2014-09-15: 17 mL via INTRAVENOUS

## 2014-09-24 ENCOUNTER — Ambulatory Visit: Payer: Self-pay | Admitting: Oncology

## 2015-01-03 ENCOUNTER — Other Ambulatory Visit: Payer: Self-pay | Admitting: Internal Medicine

## 2015-01-14 NOTE — Op Note (Signed)
PATIENT NAME:  Danielle Sparks, Danielle Sparks MR#:  [id number] DATE OF BIRTH:  28-Apr-1961  DATE OF PROCEDURE:  12/09/2012  PREOPERATIVE DIAGNOSIS:  Breast cancer.   POSTOPERATIVE DIAGNOSIS:  Breast cancer.   PROCEDURE:  Insertion, central venous catheter with subcutaneous infusion port.   SURGEON:  Rochel Brome, M.D.   ANESTHESIA:  Local 1% Xylocaine with monitored anesthesia care.   INDICATIONS:  This 54 year old female recently had surgery for cancer of the right breast and is now needing central venous access for chemotherapy.   DESCRIPTION OF PROCEDURE:  The patient was placed on the operating table in the supine position under general anesthesia. The rolled sheet was placed behind the shoulder blades. The neck was extended. The neck and left subclavian areas were prepared with ChloraPrep and draped in a sterile manner.   The skin beneath the clavicle was infiltrated with 1% Xylocaine. A transversely oriented 3 cm incision was made, carried down through subcutaneous tissues. A subcutaneous pouch was created just anterior to the deep fascia large enough to admit the PFM port. Next, with the patient in Trendelenburg position the jugular vein was identified with ultrasound. The vein appeared normal. Carotid artery was identified and also the thyroid was noted. The skin overlying the jugular vein was infiltrated with 1% Xylocaine. A transversely oriented 6 mm incision was made and carried down through subcutaneous tissues. Next, with ultrasound guidance a needle was inserted into the jugular vein and aspirated blood. Initially the guidewire would not thread in, but I repositioned the needle with ultrasound guidance and subsequently was able to pass the guidewire down into the central circulation. An ultrasound image was saved for the paper chart.   Next, fluoroscopy was used as the needle was withdrawn and the dilator and introduction sheath were advanced over the guidewire. The guidewire and dilator were  removed. The catheter was passed down through the sheath and the sheath peeled away. The catheter was manipulated. The tip would not go far into the vena cava. However, a loop would go down as far as the inferior vena cava. When pulled back the tip was positioned at a distance some 13 cm from the skin incision. A fluoroscopic image was saved for the paper chart. There was blood return. The catheter was then tunneled down to the subclavian port and pressure held over the tunnel site. The catheter was cut to fit and attached to the port. It was accessed with a Huber needle and aspirated a trace of blood and flushed with 10 mL heparinized saline solution. The port was placed into the subcutaneous pouch and sutured to the deep fascia with 4-0 silk. The pouch was closed with 4-0 Vicryl. There was some oozing from the skin edges, and injected some Xylocaine with epinephrine and hemostasis subsequently appeared to be intact. The skin edges were closed with 5-0 Vicryl subcuticular suture and Dermabond. The patient tolerated surgery satisfactorily and was then prepared for transfer to the recovery room.     ____________________________ Lenna Sciara. Rochel Brome, MD [email or web address]:dm D: 12/09/2012 12:31:00 ET T: 12/09/2012 13:05:58 ET JOB#: [id number]  cc: Loreli Dollar, MD, <Dictator> Loreli Dollar MD ELECTRONICALLY SIGNED 12/10/2012 18:04

## 2015-01-14 NOTE — Op Note (Signed)
PATIENT NAME:  Danielle Sparks, CAPITO MR#:  [id number] DATE OF BIRTH:  [birth year]  DATE OF PROCEDURE:  11/12/2012  PREOPERATIVE DIAGNOSIS:  Carcinoma of the right breast.   POSTOPERATIVE DIAGNOSIS:  Carcinoma of the right breast.   PROCEDURE:  Right partial mastectomy with sentinel lymph node biopsy and axillary lymph node dissection.   SURGEON:  Rochel Brome, MD   ANESTHESIA:  General.   INDICATION:  This 54 year old female recently had a mammogram depicting a mass in the axillary region of the right breast. Ultrasound also demonstrated this hypoechoic mass. Ultrasound-guided core biopsy was positive for infiltrating carcinoma and surgery was recommended for definitive treatment. She did have preoperative injection of radioactive technetium sulfur colloid. She had ultrasound-guided insertion of Kopan's wire. The ultrasound images were reviewed demonstrating location of the thickener and the nodule.   DESCRIPTION OF PROCEDURE:  The patient was placed on the operating table in the supine position under general anesthesia. The right arm was placed on a lateral arm support. The dressing was removed from the axillary region of the right breast exposing the Kopan's wire which entered the breast in the posterior inferior aspect of the axilla. The Kopan's wire was cut 2 cm from the skin. The breast and surrounding chest wall and upper arm were prepared with ChloraPrep and draped in a sterile manner.   The gamma counter was used to demonstrate location of radioactivity in the inferior aspect of the axilla. An obliquely oriented incision was made and carried down through subcutaneous tissues and dissected down to encounter the wire. Further dissection was carried out removing fatty tissue surrounding the thick portion of the wire and with a course of dissection was able to palpate a nodule adjacent to the thick portion of the wire. Also, the gamma counter demonstrated the location of the sentinel lymph node just  about 2.5 cm cephalad to the tumor and the mass with the wire was resected by dissecting around the mass with electrocautery and also dissected out the sentinel lymph node in continuity with the breast specimen. The tissues were further palpated and determined there were some other firm lymph nodes in the immediate area. The resected specimen was oriented with margin markers sutured to the specimen to mark the deep margin, cranial, caudal, medial and lateral margins. The Kopan's wire was left intact. The sentinel lymph node was tagged with a 3-0 nylon stitch and a sticker which was marked sentinel and was sutured to the lymph node and this was sent fresh for pathology. The wound was further inspected. It is noted during the course of dissection a number of clamped vessels were suture ligated with 4-0 chromic. Further inspection revealed there was some remaining adenopathy within the wound and was suspicious for metastasis and therefore began axillary lymph node dissection.   During the course of the surgery, the pathologist did call to indicate that the cancer was identified, margins were close practically deep margin and also the sentinel lymph node was positive for cancer.   The axillary dissection was further carried out and this did remove additional portion of tissue which surrounded the site of the tumor and did dissect up to the axillary vein which was identified and also demonstrated the thoracodorsal nerve. Care was taken to avoid injury to the long thoracic nerve. The lymph node dissection was further carried out. Several clamped vessels were suture ligated with 4-0 chromic and the specimen was dissected and sent fresh for routine pathology. The wound was inspected. Several small bleeding  points were cauterized. Hemostasis subsequently appeared to be intact. The wound was irrigated with saline solution. There was no remaining palpable mass within the wound. A 15 [place] Blake drain was inserted and  brought out through a separate inferior stab wound and sutured to the skin with 3-0 nylon and the fluted end was cut to fit and placed into the dependent portion of the axillary wound.   The subcutaneous tissues were closed with interrupted 3-0 chromic. The skin was closed with running 5-0 Monocryl subcuticular suture. The skin was infiltrated with 0.5% Sensorcaine with epinephrine. The wound was treated with Dermabond. The drain site was dressed with benzoin and Tegaderm. The drain was also anchored to the skin with benzoin and 2 inch paper tape. The drain was activated and had a small amount of serosanguineous drainage. The patient tolerated surgery satisfactorily and was then prepared for transfer to the recovery room.    ____________________________ Lenna Sciara. Rochel Brome, MD jws:si D: 11/12/2012 15:00:49 ET T: 11/12/2012 15:18:24 ET JOB#: [id number]  cc: Loreli Dollar, MD, <Dictator> Loreli Dollar MD ELECTRONICALLY SIGNED 11/12/2012 20:49

## 2015-01-14 NOTE — Op Note (Signed)
PATIENT NAME:  Danielle Sparks, Danielle Sparks MR#:  [id number] DATE OF BIRTH:  07-Mar-1961  DATE OF PROCEDURE:  12/19/2012  PREOPERATIVE DIAGNOSIS:  Right breast cancer.   POSTOPERATIVE DIAGNOSIS:  Right breast cancer.   PROCEDURE:  Reposition of central venous catheter with port.   SURGEON:  Rochel Brome, M.D.   ANESTHESIA:  General, local 1% Xylocaine.   INDICATIONS:  This 54 year old female has recently had surgery for cancer of the right breast and recently had insertion of Port-A-Cath with the port in the left subclavian area and the catheter entered the left jugular vein and with the tip in the superior vena cava.  She went for chemotherapy and it was not possible to aspirate blood, had a dye study which demonstrated the catheter tip had entered a tributary of the superior vena cava.  A small tributary and repositioning was recommended for further treatment.   DESCRIPTION OF PROCEDURE:  The patient was placed on the operating table in the supine position.  It appeared that her IV was not functioning and the anesthetist found it was difficult to insert an IV, but ultimately to get an IV started and patient was in a bit of distress and elected to have general anesthesia and did have general anesthesia with insertion of LMA.    A rolled sheet was placed behind the shoulder blades so that the neck was extended, head turned 15 degrees to the right.  The left subclavian area and also cervical areas were prepared with ChloraPrep, draped in a sterile manner.    The subclavian incision was opened largely with use of hemostat and also suture material was removed.  The port was mobilized and brought up out of the wound.  The port was accessed with [facility] needle, aspirated a trace of blood, and then flushed with saline.  Next, fluoroscopy was done with ejection of 5 mL of Isovue half-strength dye.  This was the catheter pulled back approximately 2 cm and the dye was injected quickly with use of fluoroscopy, visualized  the superior vena cava and there was very quick disappearance of the dye and appeared that the vena cava was widely patent.   Next, 2 cm of the catheter was removed and the catheter reattached to the port and using the accompanying sleeve to secure it, the port was placed back into the subcutaneous pocket and again with access injected an additional 3 mL of Isovue with fluoroscopy and demonstrated it was in the vena cava and the dye rapidly vanished.  The port was then flushed with heparinized saline solution and then aspirated a trace of blood, and then flushed again with heparinized saline solution.  Next, the port was attached to the deep fascia with 3-0 silk on one side and with 5-0 Vicryl on the other side.  It is noted that there was no bleeding during the procedure.  The subcutaneous tissues were closed with 5-0 Vicryl.  The skin was closed with running 5-0 Vicryl subcuticular suture and then treated with Dermabond.  The patient tolerated surgery satisfactorily and was then prepared for transfer to the recovery room.  It is noted that the fluoroscopic image was saved for the paper chart.     ____________________________ J. Rochel Brome, MD jws:ea D: 12/19/2012 15:02:36 ET T: 12/20/2012 03:51:39 ET JOB#: [id number]  cc: Loreli Dollar, MD, <Dictator> Loreli Dollar MD ELECTRONICALLY SIGNED 12/24/2012 17:54

## 2015-01-14 NOTE — Consult Note (Signed)
Reason for Visit: This 54 year old Female patient presents to the clinic for initial evaluation of  breast cancer .   Referred by Dr. Oliva Bustard.  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 This was confirmed on ultrasound to be a 0.6 x 0.9 x 0.6 cm hypoechoic nodule.patient underwentultrasound-guided biopsy positive for invasive mammary carcinoma. This was followed by a wide local excision and sentinel node biopsy. Pathology showed a 1.3 cm grade 1 invasive mammary carcinoma ER/PR positive HER-2/neu not overexpressed. Sentinel node had macro metastatic focus of malignancy with extracapsular extension. 14 other lymph nodes showed no evidence of disease. ductal carcinoma in situ was present. Medial margin was focally positive at 1 mm. Patient has undergone Cytoxan Adriamycin followed by Taxol which she recently completed. Tolerated her treatments extremely well with very little side effects or complaints.she specifically denies breast tenderness cough or bone pain. She is to go is had bilateral slight nipple inversion.  Past Hx:    Mitral Valve Prolapse:    Anemia: h/o   HTN:    Breast Cancer:    Anxiety:    Depression:    Portacath: Mar 2014   Right partial mastectomy with SN bx and axillary dissection: Feb 2014   Mastectomy - right partial:    Ganglion Cyst Excision:    Bilateral Foot Surgery:    Hysterectomy:   Past, Family and Social History:  Past  Medical History positive   Cardiovascular hypertension; mitral valve prolapse   Neurological/Psychiatric anxiety; depression   Past Surgical History hysterectomy, foot surgery, ganglion cyst removal   Past Medical History Comments history of anemia   Family History positive   Family History Comments father with prostate cancer, paternal sister with breast cancer grandmother with ovarian cancer   Social History noncontributory   Additional Past Medical and Surgical History accompanied by her husband today.  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   Allergies:   Sulfa drugs: Hives  Celebrex: Rash  Home Meds:  Home Medications: Medication Instructions Status  promethazine 25 mg oral tablet 1 tab(s) orally every 6 hours as needed for nausea Active  omeprazole 20 mg oral delayed release tablet 1 tab(s) orally 2 times a day Active  clonazepam 0.5 mg oral tablet 1 tab(s) orally 3 times a day, As Needed Active  Effexor 150 mg oral capsule, extended release 3 cap(s) orally once a day Active  Wellbutrin 75 mg oral tablet  orally 2 times a day Active  hydrochlorothiazide 25 mg oral tablet 1 tab(s) orally once a day Active  Norco 325 mg-5 mg oral tablet 1 to  2 tablets every 4 hrs as needed for pain Active  acetaminophen 650 mg oral tablet, extended release 2 tab(s) orally every 8 hours as needed for pain Active   Review of Systems:  General negative   Performance Status (ECOG) 0   Skin negative   Breast see HPI   Ophthalmologic negative  ENMT negative   Respiratory and Thorax negative   Cardiovascular negative   Gastrointestinal negative   Genitourinary negative   Musculoskeletal negative   Neurological negative   Psychiatric negative   Hematology/Lymphatics negative   Endocrine negative   Allergic/Immunologic negative   Nursing Notes:  Nursing Vital Signs and  Chemo Nursing Nursing Notes: *CC Vital Signs Flowsheet:   11-Sep-14 14:10  Temp Temperature 96.5  Pulse Pulse 86  Respirations Respirations 20  SBP SBP 148  DBP DBP 83  Pain Scale (0-10)  0  Current Weight (kg) (kg) 84.5  Height (cm) centimeters 170  BSA (m2) 1.9   Physical Exam:  General/Skin/HEENT:  General normal   Skin normal   Eyes normal   ENMT normal   Head and Neck normal   Additional PE well-developed female in NAD. She has alopecia of the scalp. Lungs are clear to A&P cardiac examination shows regular rate and rhythm. There is a Port-A-Cath placed in the left anterior chest wall. She is a single incision in the right axilla which is well-healed. No dominant mass or nodularity is noted in either breast into position examined. There is slight nipple inversion bilaterally. No lymphedema at of the right upper extremity is noted.   Breasts/Resp/CV/GI/GU:  Respiratory and Thorax normal   Cardiovascular normal   Gastrointestinal normal   Genitourinary normal   MS/Neuro/Psych/Lymph:  Musculoskeletal normal   Neurological normal   Lymphatics normal   Other Results:  Radiology Results: [place]:    22-Jan-14 16:05, US Breast Right  US Breast Right   REASON FOR EXAM:    av rt asymmetric denstiy  COMMENTS:       PROCEDURE: [place]  - US BREAST RIGHT  - Oct 15 2012  4:05PM     RESULT: Focus right breast ultrasound dated 10/15/2012    Findings: In the region of interest of the axillary portion of the right   breast a hypoechoic nodule is identified with partially ill-defined   borders, acoustic shadowing, and areas of vascularity. This nodule   measures 0.66 x 0.99 0.62 cm. The sonographic characteristics of this   nodule are concerning and surgical consultation recommended.    IMPRESSION:  Concerning nodule within the axillary tail of the right   breast please refer to the additional radiographic view dictation for     complete discussion.        Verified By:  Mikki Santee, M.D., MD  LabUnknown:    22-Jan-14 15:12, Digital Additional Views Rt Breast [facility])  PACS Image     22-Jan-14 16:05, US Breast Right  PACS Image     17-Mar-14 09:49, PET/CT Scan Breast CA Stage/Restaging  PACS Image   [facility]:    22-Jan-14 15:12, Digital Additional Views Rt Breast (SCR)  Digital Additional Views Rt Breast (SCR)   REASON FOR EXAM:    av rt asymmetric denstiy  COMMENTS:       PROCEDURE: MAM - MAM DIG ADDVIEWS RT SCR  - Oct 15 2012  3:12PM     RESULT: Additional views of the right breast dated 10/15/2012.    Findings: The area of asymmetric density within the prepectoralis region   of the right breast persists with magnification compression. The density   demonstrates a lobulated borders with areas of mild spiculation.   Sonographic evaluation of this region demonstrates a hypoechoic nodule   with with ill-defined borders, acoustic shadowing and areas of   vascularity. The radiographic and sonographic characteristics of this  nodule are concerning and surgical consultation is recommended.  IMPRESSION:  Nodule within the right axilla with concerning radiographic   and sonographic characteristics. BI-RADS: Category 4 - Suspicious   Abnormality - Biopsy Should Be Considered        Verified By: Mikki Santee, M.D., MD  Nuclear Med:    17-Mar-14 09:49, PET/CT Scan Breast CA Stage/Restaging  PET/CT Scan Breast CA Stage/Restaging   REASON FOR EXAM:    breast Ca  COMMENTS:       PROCEDURE: PET - PET/CT RESTG BREAST CA  - Dec 08 2012  9:49AM     RESULT: History: Breast cancer.    Comparison Study: No prior. Or [place]on Allan findings: Standard PET CT obtained   following termination of fasting blood sugar of 70 mg per deciliter   administration of 12.21 mCi of F-18 FDG. CT obtained for attenuation   correction and fusion. PET +1.6 cm density is noted in the right axilla   within SUV of 2.9 Max. No other focal PET positive abnormalities  identified.  Bowel activity noted.    IMPRESSION:  Borderline PET positive right axillary lymph node versus     postsurgical change. No other focal abnormality identified.        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 I debated whether to go with whole breast plus peripheral lymphatic radiation although I believe I can incorporate and sentinel node region where the lymph node did have extracapsular spread into my tangential breast fields and avoid any secondary peripheral lymphatics field which would significantly increase her chances of lymphedema of the right upper extremity. Risks and benefits of treatment including skin reaction, fatigue, alteration of blood counts, change in coloration of the breast were all explained in detail to the patient and her husband. Both seem to comprehend my treatment plan well. I have set her up for CT simulation early next week. Patient will also be a candidate for aromatase inhibitor after completion of radiation therapy.  I would like to take this opportunity to thank you for allowing me to continue to participate in this patient's care.  CC Referral:  cc: Dr. Tamala Julian, Dr. Anderson Malta walker   Electronic Signatures: Baruch Gouty, Roda Shutters (MD)  (Signed 11-Sep-14 14:50)  Authored: HPI, Diagnosis, Past Hx, PFSH, Allergies, Home Meds, ROS, Nursing Notes, Physical Exam, Other Results, Relevent Results, Encounter Assessment and Plan, CC Referring Physician   Last Updated: 11-Sep-14 14:50 by Armstead Peaks (MD)

## 2015-02-25 ENCOUNTER — Other Ambulatory Visit: Payer: Self-pay | Admitting: *Deleted

## 2015-02-25 DIAGNOSIS — C50911 Malignant neoplasm of unspecified site of right female breast: Secondary | ICD-10-CM

## 2015-02-28 ENCOUNTER — Inpatient Hospital Stay: Payer: Self-pay

## 2015-02-28 ENCOUNTER — Inpatient Hospital Stay: Payer: Self-pay | Admitting: Oncology

## 2015-03-10 NOTE — Telephone Encounter (Signed)
I am closing this encounter since it is from 10/17/12. I am sure it has been taking care of since it is now 2016.

## 2015-05-06 ENCOUNTER — Other Ambulatory Visit: Payer: Self-pay | Admitting: Oncology

## 2015-05-06 NOTE — Telephone Encounter (Signed)
Pt was a NS for appt on 02/28/15, states she did not know about the appt and scheuled an appt for Monday and wants to wait for rf after that

## 2015-05-09 ENCOUNTER — Encounter (INDEPENDENT_AMBULATORY_CARE_PROVIDER_SITE_OTHER): Payer: Self-pay

## 2015-05-09 ENCOUNTER — Inpatient Hospital Stay: Payer: BLUE CROSS/BLUE SHIELD | Attending: Oncology | Admitting: Oncology

## 2015-05-09 ENCOUNTER — Encounter: Payer: Self-pay | Admitting: Oncology

## 2015-05-09 ENCOUNTER — Inpatient Hospital Stay: Payer: BLUE CROSS/BLUE SHIELD

## 2015-05-09 VITALS — BP 133/93 | HR 101 | Temp 95.9°F | Wt 182.5 lb

## 2015-05-09 DIAGNOSIS — Z79899 Other long term (current) drug therapy: Secondary | ICD-10-CM | POA: Diagnosis not present

## 2015-05-09 DIAGNOSIS — Z8659 Personal history of other mental and behavioral disorders: Secondary | ICD-10-CM | POA: Insufficient documentation

## 2015-05-09 DIAGNOSIS — Z719 Counseling, unspecified: Secondary | ICD-10-CM

## 2015-05-09 DIAGNOSIS — K769 Liver disease, unspecified: Secondary | ICD-10-CM | POA: Diagnosis not present

## 2015-05-09 DIAGNOSIS — C50911 Malignant neoplasm of unspecified site of right female breast: Secondary | ICD-10-CM

## 2015-05-09 DIAGNOSIS — Z170 Estrogen receptor positive status [ER+]: Secondary | ICD-10-CM | POA: Diagnosis not present

## 2015-05-09 DIAGNOSIS — Z79811 Long term (current) use of aromatase inhibitors: Secondary | ICD-10-CM | POA: Diagnosis not present

## 2015-05-09 DIAGNOSIS — Z923 Personal history of irradiation: Secondary | ICD-10-CM | POA: Insufficient documentation

## 2015-05-09 LAB — CBC WITH DIFFERENTIAL/PLATELET
Basophils Absolute: 0 10*3/uL (ref 0–0.1)
Basophils Relative: 1 %
Eosinophils Absolute: 0.1 10*3/uL (ref 0–0.7)
Eosinophils Relative: 1 %
HEMATOCRIT: 41.6 % (ref 35.0–47.0)
Hemoglobin: 14.4 g/dL (ref 12.0–16.0)
LYMPHS PCT: 23 %
Lymphs Abs: 1.5 10*3/uL (ref 1.0–3.6)
MCH: 32.3 pg (ref 26.0–34.0)
MCHC: 34.7 g/dL (ref 32.0–36.0)
MCV: 93.2 fL (ref 80.0–100.0)
MONO ABS: 0.4 10*3/uL (ref 0.2–0.9)
MONOS PCT: 6 %
NEUTROS ABS: 4.3 10*3/uL (ref 1.4–6.5)
Neutrophils Relative %: 69 %
Platelets: 236 10*3/uL (ref 150–440)
RBC: 4.46 MIL/uL (ref 3.80–5.20)
RDW: 13.2 % (ref 11.5–14.5)
WBC: 6.2 10*3/uL (ref 3.6–11.0)

## 2015-05-09 LAB — COMPREHENSIVE METABOLIC PANEL
ALT: 23 U/L (ref 14–54)
ANION GAP: 11 (ref 5–15)
AST: 27 U/L (ref 15–41)
Albumin: 4.7 g/dL (ref 3.5–5.0)
Alkaline Phosphatase: 73 U/L (ref 38–126)
BILIRUBIN TOTAL: 0.6 mg/dL (ref 0.3–1.2)
BUN: 12 mg/dL (ref 6–20)
CO2: 27 mmol/L (ref 22–32)
Calcium: 9.6 mg/dL (ref 8.9–10.3)
Chloride: 97 mmol/L — ABNORMAL LOW (ref 101–111)
Creatinine, Ser: 0.85 mg/dL (ref 0.44–1.00)
GFR calc Af Amer: >60 mL/min (ref >60)
GFR calc non Af Amer: >60 mL/min (ref >60)
Glucose, Bld: 110 mg/dL — ABNORMAL HIGH (ref 65–99)
POTASSIUM: 3.4 mmol/L — AB (ref 3.5–5.1)
Sodium: 135 mmol/L (ref 135–145)
TOTAL PROTEIN: 7.5 g/dL (ref 6.5–8.1)

## 2015-05-09 NOTE — Progress Notes (Signed)
Patient does have living will. Never smoked. 

## 2015-05-10 LAB — CANCER ANTIGEN 27.29: CA 27.29: 22.1 U/mL (ref 0.0–38.6)

## 2015-05-22 NOTE — Progress Notes (Signed)
[place] @ [facility] [phone]  [phone]     Danielle Sparks OB: 21-Sep-1961  MR#: [id number]  CZY#:[id number]  Patient Care Team: Ronette Deter, MD as PCP - General (Internal Medicine)  CHIEF COMPLAINT:  Chief Complaint  Patient presents with  . Follow-up  1. Carcinoma of breast (right)needle biopsy on February 10,2014  lumpectomy and exhibited node dissection on November 12, 2012. 1.3 cm tumor.  In her right axillary tail.  One out of 12 lymph nodes positive for tumor with extranodal extension.  Estrogen receptor positive.  Progesterone receptor positive.  HER-2 receptor negative by FISH.  Medial margins are positive with invasive cancer(T1 B,  N1A N0) stage II 2. Previous history of left breast stereotactic  biopsy for benign lesion several years ago, 3. Started on Cytoxan and Adriamycin  December 15, 2012 4. Weekly Taxol started on March 09, 2013 5. Genetic mutattion Results of myRisk Hereditary Cancer panel was NEGATIVE at this time for any deleterious mutation at this time.  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  Has been started on Femara 2.5 mg by mouth daily.  November of 2014   No history exists.    No flowsheet data found.  INTERVAL HISTORY: 54 year old lady with history of stage II carcinoma of breast status post chemotherapy radiation therapy and now on letrozole.  Tolerated treatment very well.  No bony pains.  No bony fractures reported.  Getting regular mammograms done REVIEW OF SYSTEMS:   GENERAL:  Feels good.  Active.  No fevers, sweats or weight loss. PERFORMANCE STATUS (ECOG): 01 HEENT:  No visual changes, runny nose, sore throat, mouth sores or tenderness. Lungs: No shortness of breath or cough.  No hemoptysis. Cardiac:   No chest pain, palpitations, orthopnea, or PND. GI:  No nausea, vomiting, diarrhea, constipation, melena or hematochezia. GU:  No urgency, frequency, dysuria, or hematuria. Musculoskeletal:  No back pain.  No joint pain.  No muscle tenderness. Extremities:  No pain or swelling. Skin:  No rashes or skin changes. Neuro:  No headache, numbness or weakness, balance or coordination issues. Endocrine:  No diabetes, thyroid issues, hot flashes or night sweats. Psych:  No mood changes, depression or anxiety. Pain:  No focal pain. Review of systems:  All other systems reviewed and found to be negative. As per HPI. Otherwise, a complete review of systems is negatve.  PAST MEDICAL HISTORY: Past Medical History  Diagnosis Date  . Other vitamin B12 deficiency anemia   . Postmenopausal atrophic vaginitis   . Bunion   . Depression   . Other vitamin B12 deficiency anemia   . Cancer     breast    PAST SURGICAL HISTORY: Past Surgical History  Procedure Laterality Date  . Abdominal hysterectomy    . Ganglion cyst excision  01-09-10    Removed   . Bunionectomy      bilateral  . Breast clip      left breast  . Wisdom tooth extraction      FAMILY HISTORY Family History  Problem Relation Age of Onset  . Heart disease Father   . Skin cancer Father   . Colon cancer Neg Hx   . Rectal cancer Neg Hx   . Stomach cancer Neg Hx   . Skin cancer Mother   .  Heart disease Mother     afib    ADVANCED DIRECTIVES:  Patient does have advance healthcare directive, Patient   does not desire to make any changes HEALTH MAINTENANCE: Social History  Substance Use Topics  . Smoking status: Never Smoker   . Smokeless tobacco: Never Used  . Alcohol Use: Yes     Comment: occasional glass of wine      Allergies  Allergen Reactions  . Sulfa Antibiotics Itching    Rash under the skin  . Xanax [Alprazolam] Other (See Comments)    Night terrors    Current Outpatient Prescriptions  Medication Sig  Dispense Refill  . buPROPion (WELLBUTRIN) 75 MG tablet Take 150 mg by mouth 2 (two) times daily.     . Calcium Carbonate-Vitamin D (CALCIUM + D PO) Take by mouth.    . clonazePAM (KLONOPIN) 0.5 MG tablet Take 1 tablet (0.5 mg total) by mouth 2 (two) times daily as needed for anxiety. 60 tablet 1  . cyanocobalamin (,VITAMIN B-12,) 1000 MCG/ML injection Inject 1 mL (1,000 mcg total) into the muscle every 30 (thirty) days. 1 mL 11  . hydrochlorothiazide (HYDRODIURIL) 25 MG tablet TAKE 1 TABLET ONCE DAILY 90 tablet 1  . letrozole (FEMARA) 2.5 MG tablet TAKE 1 TABLET ONCE DAILY 30 tablet 0  . Multiple Vitamin (MULTIVITAMIN) capsule Take 1 capsule by mouth daily.    Marland Kitchen venlafaxine (EFFEXOR) 75 MG tablet Take 150 mg by mouth daily.     . ARIPiprazole (ABILIFY) 5 MG tablet Take 5 mg by mouth daily.    . cyanocobalamin 1000 MCG tablet Take 1,000 mcg by mouth daily.    Marland Kitchen losartan (COZAAR) 25 MG tablet TAKE 1 TABLET DAILY (Patient not taking: Reported on 05/09/2015) 30 tablet 5   No current facility-administered medications for this visit.    OBJECTIVE:  Filed Vitals:   05/09/15 1605  BP: 133/93  Pulse: 101  Temp: 95.9 F (35.5 C)     Body mass index is 29.23 kg/(m^2).    ECOG FS:0 - Asymptomatic  PHYSICAL EXAM: GENERAL:  Well developed, well nourished, sitting comfortably in the exam room in no acute distress. MENTAL STATUS:  Alert and oriented to person, place and time. . ENT:  Oropharynx clear without lesion.  Tongue normal. Mucous membranes moist.  RESPIRATORY:  Clear to auscultation without rales, wheezes or rhonchi. CARDIOVASCULAR:  Regular rate and rhythm without murmur, rub or gallop. BREAST:  Right breast without masses, skin changes or nipple discharge.  Left breast without masses, skin changes or nipple discharge. ABDOMEN:  Soft, non-tender, with active bowel sounds, and no hepatosplenomegaly.  No masses. BACK:  No CVA tenderness.  No tenderness on percussion of the back or rib  cage. SKIN:  No rashes, ulcers or lesions. EXTREMITIES: No edema, no skin discoloration or tenderness.  No palpable cords. LYMPH NODES: No palpable cervical, supraclavicular, axillary or inguinal adenopathy  NEUROLOGICAL: Unremarkable. PSYCH:  Appropriate.   LAB RESULTS:  CBC Latest Ref Rng 05/09/2015 08/30/2014  WBC 3.6 - 11.0 K/uL 6.2 5.7  Hemoglobin 12.0 - 16.0 g/dL 14.4 14.4  Hematocrit 35.0 - 47.0 % 41.6 41.9  Platelets 150 - 440 K/uL 236 219    Appointment on 05/09/2015  Component Date Value Ref Range Status  . WBC 05/09/2015 6.2  3.6 - 11.0 K/uL Final  . RBC 05/09/2015 4.46  3.80 - 5.20 MIL/uL Final  . Hemoglobin 05/09/2015 14.4  12.0 - 16.0 g/dL Final  . HCT 05/09/2015 41.6  35.0 -  47.0 % Final  . MCV 05/09/2015 93.2  80.0 - 100.0 fL Final  . MCH 05/09/2015 32.3  26.0 - 34.0 pg Final  . MCHC 05/09/2015 34.7  32.0 - 36.0 g/dL Final  . RDW 05/09/2015 13.2  11.5 - 14.5 % Final  . Platelets 05/09/2015 236  150 - 440 K/uL Final  . Neutrophils Relative % 05/09/2015 69   Final  . Neutro Abs 05/09/2015 4.3  1.4 - 6.5 K/uL Final  . Lymphocytes Relative 05/09/2015 23   Final  . Lymphs Abs 05/09/2015 1.5  1.0 - 3.6 K/uL Final  . Monocytes Relative 05/09/2015 6   Final  . Monocytes Absolute 05/09/2015 0.4  0.2 - 0.9 K/uL Final  . Eosinophils Relative 05/09/2015 1   Final  . Eosinophils Absolute 05/09/2015 0.1  0 - 0.7 K/uL Final  . Basophils Relative 05/09/2015 1   Final  . Basophils Absolute 05/09/2015 0.0  0 - 0.1 K/uL Final  . Sodium 05/09/2015 135  135 - 145 mmol/L Final  . Potassium 05/09/2015 3.4* 3.5 - 5.1 mmol/L Final  . Chloride 05/09/2015 97* 101 - 111 mmol/L Final  . CO2 05/09/2015 27  22 - 32 mmol/L Final  . Glucose, Bld 05/09/2015 110* 65 - 99 mg/dL Final  . BUN 05/09/2015 12  6 - 20 mg/dL Final  . Creatinine, Ser 05/09/2015 0.85  0.44 - 1.00 mg/dL Final  . Calcium 05/09/2015 9.6  8.9 - 10.3 mg/dL Final  . Total Protein 05/09/2015 7.5  6.5 - 8.1 g/dL Final  .  Albumin 05/09/2015 4.7  3.5 - 5.0 g/dL Final  . AST 05/09/2015 27  15 - 41 U/L Final  . ALT 05/09/2015 23  14 - 54 U/L Final  . Alkaline Phosphatase 05/09/2015 73  38 - 126 U/L Final  . Total Bilirubin 05/09/2015 0.6  0.3 - 1.2 mg/dL Final  . GFR calc non Af Amer 05/09/2015 >60  >60 mL/min Final  . GFR calc Af Amer 05/09/2015 >60  >60 mL/min Final   Comment: (NOTE) The eGFR has been calculated using the CKD EPI equation. This calculation has not been validated in all clinical situations. eGFR's persistently <60 mL/min signify possible Chronic Kidney Disease.   . Anion gap 05/09/2015 11  5 - 15 Final  . CA 27.29 05/09/2015 22.1  0.0 - 38.6 U/mL Final   Comment: (NOTE) [employer]/ACS methodology Performed At: [facility] [place], [place] [id number] Lindon Romp MD [id number]        STUDIES: IMPRESSION: 1. Post lumpectomy and postradiation changes in the right breast without evidence of residual or recurrent malignancy. 2. No evidence of malignancy in the left breast and no adenopathy. 3. 5 small, rounded lesions in the liver, as described above. Statistically, these most likely represent cysts or hemangiomas. Metastases are less likely. If further evaluation is clinically indicated, a liver MRI without and with contrast could be obtained.  RECOMMENDATION: Bilateral diagnostic mammogram in 1 year unless clinically indicated sooner  ASSESSMENT: MRI of breast has been reviewed done in December. No evidence of abnormality in the breast was found This has been reviewed independently There are small rounded lesion in the liver most likely cyst and maybe followed by another CT scan in one year.  Unless patient develops any symptoms.  Tumor markers are within normal limit LIVERenzymes are within normal limit  MEDICAL DECISION MAKING:  All lab data has been reviewed MRI scan of breast has been reviewed continue letrozole sum  and vitamin  D  Patient expressed understanding and was in agreement with this plan. She also understands that She can call clinic at any time with any questions, concerns, or complaints.    No matching staging information was found for the patient.  Forest Gleason, MD   05/22/2015 6:23 PM

## 2015-06-20 ENCOUNTER — Other Ambulatory Visit: Payer: Self-pay | Admitting: Oncology

## 2015-06-30 ENCOUNTER — Encounter: Payer: Self-pay | Admitting: Internal Medicine

## 2015-06-30 ENCOUNTER — Ambulatory Visit (INDEPENDENT_AMBULATORY_CARE_PROVIDER_SITE_OTHER): Payer: BLUE CROSS/BLUE SHIELD | Admitting: Internal Medicine

## 2015-06-30 VITALS — BP 121/77 | HR 75 | Temp 97.7°F | Ht 66.25 in | Wt 187.2 lb

## 2015-06-30 DIAGNOSIS — F324 Major depressive disorder, single episode, in partial remission: Secondary | ICD-10-CM

## 2015-06-30 DIAGNOSIS — R61 Generalized hyperhidrosis: Secondary | ICD-10-CM

## 2015-06-30 DIAGNOSIS — C50911 Malignant neoplasm of unspecified site of right female breast: Secondary | ICD-10-CM | POA: Diagnosis not present

## 2015-06-30 DIAGNOSIS — I10 Essential (primary) hypertension: Secondary | ICD-10-CM

## 2015-06-30 HISTORY — DX: Generalized hyperhidrosis: R61

## 2015-06-30 LAB — BASIC METABOLIC PANEL
BUN: 11 mg/dL (ref 6–23)
CHLORIDE: 100 meq/L (ref 96–112)
CO2: 30 meq/L (ref 19–32)
Calcium: 9.9 mg/dL (ref 8.4–10.5)
Creatinine, Ser: 0.73 mg/dL (ref 0.40–1.20)
GFR: 87.54 mL/min (ref >60.00)
Glucose, Bld: 76 mg/dL (ref 70–99)
POTASSIUM: 3.9 meq/L (ref 3.5–5.1)
Sodium: 140 mEq/L (ref 135–145)

## 2015-06-30 LAB — VITAMIN B12: Vitamin B-12: 605 pg/mL (ref 211–911)

## 2015-06-30 LAB — TSH: TSH: 0.89 u[IU]/mL (ref 0.35–4.50)

## 2015-06-30 MED ORDER — GENTAMICIN SULFATE 0.1 % EX OINT: 1.0000 "application " | TOPICAL_OINTMENT | Freq: Three times a day (TID) | CUTANEOUS | Status: DC

## 2015-06-30 NOTE — Assessment & Plan Note (Signed)
Right breast cancer. Reviewed recent MRI breast which showed no recurrent disease. Offered support today. Discussed pros and cons of Femara.

## 2015-06-30 NOTE — Patient Instructions (Signed)
Labs today.  Follow up in 2-4 weeks.

## 2015-06-30 NOTE — Assessment & Plan Note (Signed)
Diaphoresis likely related to menopause and use of Femara. Will check TSH, B12. Recent CMP and CBC normal.

## 2015-06-30 NOTE — Progress Notes (Signed)
Pre visit review using our clinic review tool, if applicable. No additional management support is needed unless otherwise documented below in the visit note. 

## 2015-06-30 NOTE — Progress Notes (Signed)
Subjective:    Patient ID: Danielle Sparks, female    DOB: Mar 07, 1961, 54 y.o.   MRN: [id number]  HPI  54YO female presents for acute visit.  Diaphoresis and Fatigue - This week having episodes of sweating. Not like previous hot flashes. Stopped taking B12 injections. Feels extremely fatigued. No focal symptoms. Feels that she will "never be the same" as before cancer.  Sad and tearful today describing fear that breast cancer will return. Working with Dr. Nicolasa Ducking. Continues on Effexor.  Wt Readings from Last 3 Encounters:  06/30/15 187 lb 4 oz (84.936 kg)  05/09/15 182 lb 8 oz (82.781 kg)  09/15/14 189 lb (85.73 kg)   BP Readings from Last 3 Encounters:  06/30/15 121/77  05/09/15 133/93  09/01/14 128/92    Past Medical History  Diagnosis Date  . Other vitamin B12 deficiency anemia   . Postmenopausal atrophic vaginitis   . Bunion   . Depression   . Other vitamin B12 deficiency anemia   . Cancer [facility])     breast   Family History  Problem Relation Age of Onset  . Heart disease Father   . Skin cancer Father   . Colon cancer Neg Hx   . Rectal cancer Neg Hx   . Stomach cancer Neg Hx   . Skin cancer Mother   . Heart disease Mother     afib   Past Surgical History  Procedure Laterality Date  . Abdominal hysterectomy    . Ganglion cyst excision  01-09-10    Removed   . Bunionectomy      bilateral  . Breast clip      left breast  . Wisdom tooth extraction     Social History   Social History  . Marital Status: Married    Spouse Name: N/A  . Number of Children: N/A  . Years of Education: N/A   Social History Main Topics  . Smoking status: Never Smoker   . Smokeless tobacco: Never Used  . Alcohol Use: Yes     Comment: occasional glass of wine  . Drug Use: No  . Sexual Activity: Not Asked   Other Topics Concern  . None   Social History Narrative    Review of Systems  Constitutional: Positive for diaphoresis and fatigue. Negative for fever, chills,  appetite change and unexpected weight change.  Eyes: Negative for visual disturbance.  Respiratory: Negative for shortness of breath.   Cardiovascular: Negative for chest pain, palpitations and leg swelling.  Gastrointestinal: Negative for nausea, vomiting, abdominal pain, diarrhea and constipation.  Musculoskeletal: Negative for myalgias and arthralgias.  Skin: Negative for color change and rash.  Hematological: Negative for adenopathy. Does not bruise/bleed easily.  Psychiatric/Behavioral: Positive for dysphoric mood. Negative for suicidal ideas and sleep disturbance. The patient is nervous/anxious.        Objective:    BP 121/77 mmHg  Pulse 75  Temp(Src) 97.7 F (36.5 C) (Oral)  Ht 5' 6.25" (1.683 m)  Wt 187 lb 4 oz (84.936 kg)  BMI 29.99 kg/m2  SpO2 99% Physical Exam  Constitutional: She is oriented to person, place, and time. She appears well-developed and well-nourished. No distress.  HENT:  Head: Normocephalic and atraumatic.  Right Ear: External ear normal.  Left Ear: External ear normal.  Nose: Nose normal.  Mouth/Throat: Oropharynx is clear and moist. No oropharyngeal exudate.  Eyes: Conjunctivae are normal. Pupils are equal, round, and reactive to light. Right eye exhibits no discharge. Left eye exhibits  no discharge. No scleral icterus.  Neck: Normal range of motion. Neck supple. No tracheal deviation present. No thyromegaly present.  Cardiovascular: Normal rate, regular rhythm, normal heart sounds and intact distal pulses.  Exam reveals no gallop and no friction rub.   No murmur heard. Pulmonary/Chest: Effort normal and breath sounds normal. No respiratory distress. She has no wheezes. She has no rales. She exhibits no tenderness.  Musculoskeletal: Normal range of motion. She exhibits no edema or tenderness.  Lymphadenopathy:    She has no cervical adenopathy.  Neurological: She is alert and oriented to person, place, and time. No cranial nerve deficit. She exhibits  normal muscle tone. Coordination normal.  Skin: Skin is warm and dry. No rash noted. She is not diaphoretic. No erythema. No pallor.  Psychiatric: Her speech is normal and behavior is normal. Judgment and thought content normal. Her mood appears anxious. Cognition and memory are normal. She exhibits a depressed mood.          Assessment & Plan:   Problem List Items Addressed This Visit      Unprioritized   Breast cancer, right breast ([place])    Right breast cancer. Reviewed recent MRI breast which showed no recurrent disease. Offered support today. Discussed pros and cons of Femara.      Depression, major, single episode    Encouraged continued follow up with Dr. Nicolasa Ducking. Continue Effexor.      Diaphoresis - Primary    Diaphoresis likely related to menopause and use of Femara. Will check TSH, B12. Recent CMP and CBC normal.      Relevant Orders   TSH   X50   Basic Metabolic Panel (BMET)   Hypertension    BP Readings from Last 3 Encounters:  06/30/15 121/77  05/09/15 133/93  09/01/14 128/92   BP well controlled. Will try stopping HCTZ, as her potassium has been low. Recheck BP in 2 weeks.          Return in about 2 weeks (around 07/14/2015) for Recheck.

## 2015-06-30 NOTE — Assessment & Plan Note (Signed)
Encouraged continued follow up with Dr. Nicolasa Ducking. Continue Effexor.

## 2015-06-30 NOTE — Assessment & Plan Note (Addendum)
BP Readings from Last 3 Encounters:  06/30/15 121/77  05/09/15 133/93  09/01/14 128/92   BP well controlled. Will try stopping HCTZ, as her potassium has been low. Recheck BP in 2 weeks.

## 2015-07-13 ENCOUNTER — Ambulatory Visit (INDEPENDENT_AMBULATORY_CARE_PROVIDER_SITE_OTHER): Payer: BLUE CROSS/BLUE SHIELD | Admitting: Internal Medicine

## 2015-07-13 ENCOUNTER — Encounter: Payer: Self-pay | Admitting: Internal Medicine

## 2015-07-13 VITALS — BP 138/78 | HR 86 | Temp 97.7°F | Ht 66.0 in | Wt 189.5 lb

## 2015-07-13 DIAGNOSIS — I10 Essential (primary) hypertension: Secondary | ICD-10-CM | POA: Diagnosis not present

## 2015-07-13 DIAGNOSIS — R61 Generalized hyperhidrosis: Secondary | ICD-10-CM | POA: Diagnosis not present

## 2015-07-13 DIAGNOSIS — E669 Obesity, unspecified: Secondary | ICD-10-CM | POA: Diagnosis not present

## 2015-07-13 DIAGNOSIS — F321 Major depressive disorder, single episode, moderate: Secondary | ICD-10-CM

## 2015-07-13 DIAGNOSIS — E663 Overweight: Secondary | ICD-10-CM

## 2015-07-13 HISTORY — DX: Overweight: E66.3

## 2015-07-13 MED ORDER — PHENTERMINE HCL 37.5 MG PO CAPS: 37.5000 mg | ORAL_CAPSULE | ORAL | Status: DC

## 2015-07-13 NOTE — Assessment & Plan Note (Signed)
Reviewed recent labs. Symptoms likely secondary to use of Femara. Will follow.

## 2015-07-13 NOTE — Assessment & Plan Note (Signed)
BP Readings from Last 3 Encounters:  07/13/15 138/78  06/30/15 121/77  05/09/15 133/93   BP initially more elevated, however pt tearful in office. Will monitor BP closely over next few weeks. She will email with daily BP reading.

## 2015-07-13 NOTE — Progress Notes (Signed)
Subjective:    Patient ID: Danielle Sparks, female    DOB: 08-Mar-1961, 54 y.o.   MRN: [id number]  HPI  54YO female presents for follow up.  Last seen 10/6 with diaphoresis, fatigue. Labs were normal including TSH and B12. She feels that symptoms are secondary to Femara. Stopped Femara. Hot flashes improved.  Plans to restart and see if symptoms recur.  Frustrated by weight gain. Would like to restart phentermine. 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 Feels that she cannot follow any specific diet. Feels she is addicted to carbohydrates. Does not have time for exercise.    Wt Readings from Last 3 Encounters:  07/13/15 189 lb 8 oz (85.957 kg)  06/30/15 187 lb 4 oz (84.936 kg)  05/09/15 182 lb 8 oz (82.781 kg)   BP Readings from Last 3 Encounters:  07/13/15 138/78  06/30/15 121/77  05/09/15 133/93    Past Medical History  Diagnosis Date  . Other vitamin B12 deficiency anemia   . Postmenopausal atrophic vaginitis   . Bunion   . Depression   . Other vitamin B12 deficiency anemia   . Cancer [facility])     breast   Family History  Problem Relation Age of Onset  . Heart disease Father   . Skin cancer Father   . Colon cancer Neg Hx   . Rectal cancer Neg Hx   . Stomach cancer Neg Hx   . Skin cancer Mother   . Heart disease Mother     afib   Past Surgical History  Procedure Laterality Date  . Abdominal hysterectomy    . Ganglion cyst excision  01-09-10    Removed   . Bunionectomy      bilateral  . Breast clip      left breast  . Wisdom tooth extraction     Social History   Social History  . Marital Status: Married    Spouse Name: N/A  . Number of Children: N/A  . Years of Education: N/A   Social History Main Topics  . Smoking status: Never Smoker   . Smokeless tobacco: Never Used  . Alcohol Use: Yes     Comment: occasional glass of wine  . Drug  Use: No  . Sexual Activity: Not Asked   Other Topics Concern  . None   Social History Narrative    Review of Systems  Constitutional: Positive for fatigue. Negative for fever, chills, diaphoresis, appetite change and unexpected weight change.  Eyes: Negative for visual disturbance.  Respiratory: Negative for shortness of breath.   Cardiovascular: Negative for chest pain and leg swelling.  Gastrointestinal: Negative for nausea, vomiting, abdominal pain, diarrhea and constipation.  Musculoskeletal: Negative for myalgias and arthralgias.  Skin: Negative for color change and rash.  Hematological: Negative for adenopathy. Does not bruise/bleed easily.  Psychiatric/Behavioral: Positive for sleep disturbance, dysphoric mood, decreased concentration and agitation. The patient is nervous/anxious.        Objective:    BP 138/78 mmHg  Pulse 86  Temp(Src) 97.7 F (36.5 C) (Oral)  Ht 5\' 6"  (1.676 m)  Wt 189 lb 8 oz (85.957 kg)  BMI 30.60 kg/m2  SpO2 97% Physical Exam  Constitutional: She is oriented to person, place, and time. She appears well-developed and well-nourished. No distress.  HENT:  Head: Normocephalic and atraumatic.  Right Ear: External ear normal.  Left Ear: External ear normal.  Nose: Nose normal.  Mouth/Throat: Oropharynx is clear and moist. No oropharyngeal exudate.  Eyes: Conjunctivae are normal. Pupils are equal, round, and reactive to light. Right eye exhibits no discharge. Left eye exhibits no discharge. No scleral icterus.  Neck: Normal range of motion. Neck supple. No tracheal deviation present. No thyromegaly present.  Cardiovascular: Normal rate, regular rhythm, normal heart sounds and intact distal pulses.  Exam reveals no gallop and no friction rub.   No murmur heard. Pulmonary/Chest: Effort normal and breath sounds normal. No respiratory distress. She has no wheezes. She has no rales. She exhibits no tenderness.  Musculoskeletal: Normal range of motion. She  exhibits no edema or tenderness.  Lymphadenopathy:    She has no cervical adenopathy.  Neurological: She is alert and oriented to person, place, and time. No cranial nerve deficit. She exhibits normal muscle tone. Coordination normal.  Skin: Skin is warm and dry. No rash noted. She is not diaphoretic. No erythema. No pallor.  Psychiatric: Her behavior is normal. Judgment and thought content normal. Her mood appears anxious. Her speech is rapid and/or pressured. She exhibits a depressed mood. She expresses no suicidal ideation.          Assessment & Plan:   Problem List Items Addressed This Visit      Unprioritized   Depression, major, single episode    Longstanding depression. Symptoms recently worsening with family and work stressors. Encouraged follow up with both Dr. Nicolasa Ducking and Dr. Nelva Bush.  Continue Effexor and Wellbutrin.      Diaphoresis - Primary    Reviewed recent labs. Symptoms likely secondary to use of Femara. Will follow.      Hypertension    BP Readings from Last 3 Encounters:  07/13/15 138/78  06/30/15 121/77  05/09/15 133/93   BP initially more elevated, however pt tearful in office. Will monitor BP closely over next few weeks. She will email with daily BP reading.      Obesity (BMI 30-39.9)    Wt Readings from Last 3 Encounters:  07/13/15 189 lb 8 oz (85.957 kg)  06/30/15 187 lb 4 oz (84.936 kg)  05/09/15 182 lb 8 oz (82.781 kg)  Body mass index is 30.6 kg/(m^2). Tearful today describing weight gain. She declines referral for nutritionist. Discussed some healthy diet options. We discussed adding Saxenda to help with appetite. She will look into coverage for this. In the interim, will start phentermine to help with appetite suppression. Monitor BP daily. Email with readings. Follow up in 4 weeks.      Relevant Medications   phentermine 37.5 MG capsule       Return in about 4 weeks (around 08/10/2015) for Recheck.

## 2015-07-13 NOTE — Assessment & Plan Note (Signed)
Wt Readings from Last 3 Encounters:  07/13/15 189 lb 8 oz (85.957 kg)  06/30/15 187 lb 4 oz (84.936 kg)  05/09/15 182 lb 8 oz (82.781 kg)  Body mass index is 30.6 kg/(m^2). Tearful today describing weight gain. She declines referral for nutritionist. Discussed some healthy diet options. We discussed adding Saxenda to help with appetite. She will look into coverage for this. In the interim, will start phentermine to help with appetite suppression. Monitor BP daily. Email with readings. Follow up in 4 weeks.

## 2015-07-13 NOTE — Assessment & Plan Note (Signed)
Longstanding depression. Symptoms recently worsening with family and work stressors. Encouraged follow up with both Dr. Nicolasa Ducking and Dr. Nelva Bush.  Continue Effexor and Wellbutrin.

## 2015-07-13 NOTE — Progress Notes (Signed)
Pre visit review using our clinic review tool, if applicable. No additional management support is needed unless otherwise documented below in the visit note. 

## 2015-07-13 NOTE — Patient Instructions (Addendum)
Look into coverage for Saxenda.  [email or web address]  Start Phentermine 37.5mg  daily in the morning.  Monitor blood pressure daily. Email with readings.

## 2015-07-15 ENCOUNTER — Encounter: Payer: Self-pay | Admitting: Internal Medicine

## 2015-07-18 ENCOUNTER — Telehealth: Payer: Self-pay

## 2015-07-18 NOTE — Telephone Encounter (Signed)
Started PA on Cover my meds for Phentermine tablets

## 2015-07-25 ENCOUNTER — Other Ambulatory Visit: Payer: Self-pay | Admitting: Internal Medicine

## 2015-07-25 ENCOUNTER — Telehealth: Payer: Self-pay | Admitting: *Deleted

## 2015-07-25 DIAGNOSIS — E669 Obesity, unspecified: Secondary | ICD-10-CM

## 2015-07-25 MED ORDER — LIRAGLUTIDE -WEIGHT MANAGEMENT 18 MG/3ML ~~LOC~~ SOPN: 0.6000 mg | PEN_INJECTOR | Freq: Every day | SUBCUTANEOUS | Status: DC

## 2015-07-25 NOTE — Telephone Encounter (Signed)
Patient stated that she wanted to follow up with a medication, saxenda. Patient stated that she was waiting on prior authorization

## 2015-07-25 NOTE — Telephone Encounter (Signed)
OK. I have d/c'd the Phentermine and written for Saxenda. She will need a follow up in 1-2 weeks after she starts this medication.

## 2015-07-25 NOTE — Telephone Encounter (Signed)
Hey Dr. Gilford Rile I do not see the RX for Saxenda. Danielle Sparks had stated the PA for Phentermine on 07/18/15

## 2015-07-27 ENCOUNTER — Telehealth: Payer: Self-pay

## 2015-07-27 NOTE — Telephone Encounter (Signed)
PA was unfavorable per cover my meds.

## 2015-07-27 NOTE — Telephone Encounter (Signed)
PA started on cover my meds for Saxenda.

## 2015-07-29 ENCOUNTER — Other Ambulatory Visit: Payer: Self-pay | Admitting: Oncology

## 2015-08-01 ENCOUNTER — Encounter: Payer: Self-pay | Admitting: Internal Medicine

## 2015-08-01 NOTE — Telephone Encounter (Signed)
PA denied.

## 2015-08-24 ENCOUNTER — Ambulatory Visit (INDEPENDENT_AMBULATORY_CARE_PROVIDER_SITE_OTHER): Payer: Managed Care, Other (non HMO) | Admitting: Internal Medicine

## 2015-08-24 ENCOUNTER — Encounter: Payer: Self-pay | Admitting: Internal Medicine

## 2015-08-24 VITALS — BP 132/92 | HR 105 | Temp 98.0°F | Ht 66.0 in | Wt 178.0 lb

## 2015-08-24 DIAGNOSIS — F418 Other specified anxiety disorders: Secondary | ICD-10-CM

## 2015-08-24 DIAGNOSIS — E663 Overweight: Secondary | ICD-10-CM

## 2015-08-24 NOTE — Assessment & Plan Note (Signed)
Anxiety likely made worse by use of Phentermine. Encouraged her to stop the medication. Encouraged follow up with Dr. Nicolasa Ducking.

## 2015-08-24 NOTE — Progress Notes (Signed)
Pre visit review using our clinic review tool, if applicable. No additional management support is needed unless otherwise documented below in the visit note. 

## 2015-08-24 NOTE — Assessment & Plan Note (Signed)
Wt Readings from Last 3 Encounters:  08/24/15 178 lb (80.74 kg)  07/13/15 189 lb 8 oz (85.957 kg)  06/30/15 187 lb 4 oz (84.936 kg)   Body mass index is 28.74 kg/(m^2). Discussed risks of use of phentermine given anxiety, hypertension. I am concerned that this stimulant is making her anxiety worse.And, she does not meet FDA approved criteria for use. Encouraged her to stop the medication. Encouraged healthy diet and exercise.

## 2015-08-24 NOTE — Progress Notes (Signed)
Subjective:    Patient ID: Danielle Sparks, female    DOB: May 10, 1961, 54 y.o.   MRN: [id number]  HPI  54YO female presents for follow up.  Last seen 10/19. Started on Saxenda, however cost was prohibitive. She had an old script for Phentermine and started back using this. She notes increased energy, decreased appetite on the medication. 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 She has counseling scheduled for next week. She has not had CP, HA. She notes dry mouth on the medication.   Wt Readings from Last 3 Encounters:  08/24/15 178 lb (80.74 kg)  07/13/15 189 lb 8 oz (85.957 kg)  06/30/15 187 lb 4 oz (84.936 kg)   BP Readings from Last 3 Encounters:  08/24/15 132/92  07/13/15 138/78  06/30/15 121/77    Past Medical History  Diagnosis Date  . Other vitamin B12 deficiency anemia   . Postmenopausal atrophic vaginitis   . Bunion   . Depression   . Other vitamin B12 deficiency anemia   . Cancer [facility])     breast   Family History  Problem Relation Age of Onset  . Heart disease Father   . Skin cancer Father   . Colon cancer Neg Hx   . Rectal cancer Neg Hx   . Stomach cancer Neg Hx   . Skin cancer Mother   . Heart disease Mother     afib   Past Surgical History  Procedure Laterality Date  . Abdominal hysterectomy    . Ganglion cyst excision  01-09-10    Removed   . Bunionectomy      bilateral  . Breast clip      left breast  . Wisdom tooth extraction     Social History   Social History  . Marital Status: Married    Spouse Name: N/A  . Number of Children: N/A  . Years of Education: N/A   Social History Main Topics  . Smoking status: Never Smoker   . Smokeless tobacco: Never Used  . Alcohol Use: Yes     Comment: occasional glass of wine  . Drug Use: No  . Sexual Activity: Not Asked   Other Topics Concern  . None   Social History Narrative    Review of Systems    Constitutional: Negative for fever, chills, appetite change, fatigue and unexpected weight change.  Eyes: Negative for visual disturbance.  Respiratory: Negative for shortness of breath.   Cardiovascular: Negative for chest pain, palpitations and leg swelling.  Gastrointestinal: Negative for vomiting, abdominal pain, diarrhea and constipation.  Skin: Negative for color change and rash.  Hematological: Negative for adenopathy. Does not bruise/bleed easily.  Psychiatric/Behavioral: Positive for sleep disturbance and dysphoric mood. Negative for suicidal ideas. The patient is nervous/anxious.        Objective:    BP 132/92 mmHg  Pulse 105  Temp(Src) 98 F (36.7 C) (Oral)  Ht 5\' 6"  (1.676 m)  Wt 178 lb (80.74 kg)  BMI 28.74 kg/m2  SpO2 95% Physical Exam  Constitutional: She is oriented to person, place, and time. She appears well-developed and well-nourished. No distress.  HENT:  Head: Normocephalic and atraumatic.  Right Ear: External ear normal.  Left Ear: External ear normal.  Nose: Nose normal.  Mouth/Throat: Oropharynx is clear and moist. No oropharyngeal exudate.  Eyes: Conjunctivae are normal. Pupils are equal, round, and reactive to light. Right eye exhibits  no discharge. Left eye exhibits no discharge. No scleral icterus.  Neck: Normal range of motion. Neck supple. No tracheal deviation present. No thyromegaly present.  Cardiovascular: Normal rate, regular rhythm, normal heart sounds and intact distal pulses.  Exam reveals no gallop and no friction rub.   No murmur heard. Pulmonary/Chest: Effort normal and breath sounds normal. No respiratory distress. She has no wheezes. She has no rales. She exhibits no tenderness.  Musculoskeletal: Normal range of motion. She exhibits no edema or tenderness.  Lymphadenopathy:    She has no cervical adenopathy.  Neurological: She is alert and oriented to person, place, and time. No cranial nerve deficit. She exhibits normal muscle tone.  Coordination normal.  Skin: Skin is warm and dry. No rash noted. She is not diaphoretic. No erythema. No pallor.  Psychiatric: Her behavior is normal. Judgment and thought content normal. Her mood appears anxious. Her speech is rapid and/or pressured. Cognition and memory are normal. She exhibits a depressed mood. She expresses no suicidal ideation.          Assessment & Plan:  Over 47min of which >50% spent in face-to-face contact with patient discussing plan of care  Problem List Items Addressed This Visit      Unprioritized   Mixed anxiety and depressive disorder    Anxiety likely made worse by use of Phentermine. Encouraged her to stop the medication. Encouraged follow up with Dr. Nicolasa Ducking.      Overweight (BMI 25.0-29.9) - Primary    Wt Readings from Last 3 Encounters:  08/24/15 178 lb (80.74 kg)  07/13/15 189 lb 8 oz (85.957 kg)  06/30/15 187 lb 4 oz (84.936 kg)   Body mass index is 28.74 kg/(m^2). Discussed risks of use of phentermine given anxiety, hypertension. I am concerned that this stimulant is making her anxiety worse.And, she does not meet FDA approved criteria for use. Encouraged her to stop the medication. Encouraged healthy diet and exercise.          Return in about 3 months (around 11/22/2015) for Recheck.

## 2015-08-24 NOTE — Patient Instructions (Signed)
Follow up 3 months

## 2015-09-04 LAB — HM MAMMOGRAPHY

## 2015-09-06 ENCOUNTER — Other Ambulatory Visit: Payer: Self-pay | Admitting: Internal Medicine

## 2015-09-06 ENCOUNTER — Other Ambulatory Visit: Payer: Self-pay | Admitting: Oncology

## 2015-09-06 NOTE — Telephone Encounter (Signed)
Escribed

## 2015-09-07 ENCOUNTER — Ambulatory Visit
Admission: RE | Admit: 2015-09-07 | Discharge: 2015-09-07 | Disposition: A | Discharge status: Home / Self Care | Payer: Managed Care, Other (non HMO) | Source: Ambulatory Visit | Attending: Oncology | Admitting: Oncology

## 2015-09-07 ENCOUNTER — Other Ambulatory Visit: Payer: Self-pay | Admitting: Oncology

## 2015-09-07 DIAGNOSIS — Z853 Personal history of malignant neoplasm of breast: Secondary | ICD-10-CM | POA: Diagnosis not present

## 2015-09-07 DIAGNOSIS — Z780 Asymptomatic menopausal state: Secondary | ICD-10-CM | POA: Insufficient documentation

## 2015-09-07 DIAGNOSIS — C50911 Malignant neoplasm of unspecified site of right female breast: Secondary | ICD-10-CM

## 2015-09-07 HISTORY — DX: Malignant neoplasm of unspecified site of unspecified female breast: C50.919

## 2015-09-08 ENCOUNTER — Other Ambulatory Visit: Payer: BLUE CROSS/BLUE SHIELD

## 2015-09-08 ENCOUNTER — Ambulatory Visit: Payer: BLUE CROSS/BLUE SHIELD

## 2015-11-10 ENCOUNTER — Ambulatory Visit: Payer: BLUE CROSS/BLUE SHIELD | Admitting: Oncology

## 2015-11-10 ENCOUNTER — Other Ambulatory Visit: Payer: BLUE CROSS/BLUE SHIELD

## 2015-11-10 ENCOUNTER — Inpatient Hospital Stay: Payer: Managed Care, Other (non HMO) | Admitting: Oncology

## 2015-11-10 ENCOUNTER — Inpatient Hospital Stay: Payer: Managed Care, Other (non HMO)

## 2015-11-23 ENCOUNTER — Ambulatory Visit: Payer: Managed Care, Other (non HMO) | Admitting: Internal Medicine

## 2015-12-20 ENCOUNTER — Telehealth: Payer: Self-pay | Admitting: Internal Medicine

## 2015-12-20 NOTE — Telephone Encounter (Signed)
FYI, this is your 730am visit tomorrow. Thanks

## 2015-12-20 NOTE — Telephone Encounter (Signed)
Patient Name: Danielle Sparks DOB: 07-14-1961 Initial Comment Caller states has appt for tomorrow; BP was high 156/109, was very emotional w/psy this morning; Nurse Assessment Nurse: Vallery Sa, RN, [place] Date/Time (Eastern Time): 12/20/2015 9:24:05 AM Confirm and document reason for call. If symptomatic, describe symptoms. You must click the next button to save text entered. ---Caller states her blood pressure was 156/109 during appointment with Psychiatrist this morning. No breathing difficulty. No chest pain. No injury in the past 3 days. No fever. Alert and responsive. Has the patient traveled out of the country within the last 30 days? ---No Does the patient have any new or worsening symptoms? ---Yes Will a triage be completed? ---Yes Related visit to physician within the last 2 weeks? ---Yes Does the PT have any chronic conditions? (i.e. diabetes, asthma, etc.) ---Yes List chronic conditions. ---Depression, High Blood Pressure Is this a behavioral health or substance abuse call? ---No Guidelines Guideline Title Affirmed Question Affirmed Notes High Blood Pressure BP # 160/100 Final Disposition User See PCP When Office is Open (within 3 days) Vallery Sa, RN, [employer] Comments Appointment already scheduled for 7:30am tomorrow. Referrals REFERRED TO PCP OFFICE Disagree/Comply: Comply

## 2015-12-21 ENCOUNTER — Ambulatory Visit (INDEPENDENT_AMBULATORY_CARE_PROVIDER_SITE_OTHER): Payer: Managed Care, Other (non HMO) | Admitting: Internal Medicine

## 2015-12-21 ENCOUNTER — Encounter: Payer: Self-pay | Admitting: Internal Medicine

## 2015-12-21 VITALS — BP 147/91 | HR 72 | Temp 97.8°F | Ht 66.0 in | Wt 180.0 lb

## 2015-12-21 DIAGNOSIS — I10 Essential (primary) hypertension: Secondary | ICD-10-CM | POA: Diagnosis not present

## 2015-12-21 DIAGNOSIS — F418 Other specified anxiety disorders: Secondary | ICD-10-CM | POA: Diagnosis not present

## 2015-12-21 MED ORDER — HYDROCHLOROTHIAZIDE 25 MG PO TABS: 25.0000 mg | ORAL_TABLET | Freq: Every day | ORAL | Status: DC

## 2015-12-21 MED ORDER — AMLODIPINE BESYLATE 2.5 MG PO TABS: 2.5000 mg | ORAL_TABLET | Freq: Every day | ORAL | Status: DC

## 2015-12-21 NOTE — Patient Instructions (Signed)
Start Amlodipine 2.5mg  daily.  Email Friday with blood pressure readings.

## 2015-12-21 NOTE — Assessment & Plan Note (Signed)
Worsening anxiety and depression. Followed by Dr. Nicolasa Ducking. New counselor starting tomorrow.

## 2015-12-21 NOTE — Progress Notes (Signed)
Pre visit review using our clinic review tool, if applicable. No additional management support is needed unless otherwise documented below in the visit note. 

## 2015-12-21 NOTE — Assessment & Plan Note (Signed)
BP Readings from Last 3 Encounters:  12/21/15 147/91  08/24/15 132/92  07/13/15 138/78   BP elevated in setting of anxiety and depression. Will add Amlodipine 2.5mg  daily. Continue HCTZ. Email Friday. Follow up in 4 weeks.

## 2015-12-21 NOTE — Progress Notes (Signed)
Subjective:    Patient ID: Danielle Sparks, female    DOB: 21-Aug-1961, 55 y.o.   MRN: [id number]  HPI  56YO female presents for acute visit.  HTN- Seen by Dr. Nicolasa Sparks yesterday for worsening anxiety and depression. BP was elevated Then 136/84 at home on recheck. Feeling more anxious and depressed. Caring for mother with dementia and father with terminal illness.  Recently stopped Femara. Has not discussed this with oncology.  Wt Readings from Last 3 Encounters:  12/21/15 180 lb (81.647 kg)  08/24/15 178 lb (80.74 kg)  07/13/15 189 lb 8 oz (85.957 kg)   BP Readings from Last 3 Encounters:  12/21/15 147/91  08/24/15 132/92  07/13/15 138/78    Past Medical History  Diagnosis Date  . Other vitamin B12 deficiency anemia   . Postmenopausal atrophic vaginitis   . Bunion   . Depression   . Other vitamin B12 deficiency anemia   . Cancer [facility])     breast  . Breast cancer ([place]) 09/2012    chemo and radiation right   Family History  Problem Relation Age of Onset  . Heart disease Father   . Skin cancer Father   . Prostate cancer Father   . Colon cancer Neg Hx   . Rectal cancer Neg Hx   . Stomach cancer Neg Hx   . Skin cancer Mother   . Heart disease Mother     afib  . Breast cancer Paternal Aunt 41  . Breast cancer Cousin 60   Past Surgical History  Procedure Laterality Date  . Abdominal hysterectomy    . Ganglion cyst excision  01-09-10    Removed   . Bunionectomy      bilateral  . Breast clip      left breast  . Wisdom tooth extraction    . Breast excisional biopsy Right 2014    positive  . Breast biopsy Left 2002    stereotactic   Social History   Social History  . Marital Status: Married    Spouse Name: N/A  . Number of Children: N/A  . Years of Education: N/A   Social History Main Topics  . Smoking status: Never Smoker   . Smokeless tobacco: Never Used  . Alcohol Use: Yes     Comment: occasional glass of wine  . Drug Use: No  . Sexual Activity: Not  Asked   Other Topics Concern  . None   Social History Narrative    Review of Systems  Constitutional: Negative for fever, chills, appetite change, fatigue and unexpected weight change.  Eyes: Negative for visual disturbance.  Respiratory: Positive for cough. Negative for chest tightness, shortness of breath and wheezing.   Cardiovascular: Negative for chest pain and leg swelling.  Gastrointestinal: Negative for abdominal pain, diarrhea and constipation.  Skin: Negative for color change and rash.  Hematological: Negative for adenopathy. Does not bruise/bleed easily.  Psychiatric/Behavioral: Positive for sleep disturbance and dysphoric mood. The patient is nervous/anxious.        Objective:    BP 147/91 mmHg  Pulse 72  Temp(Src) 97.8 F (36.6 C) (Oral)  Ht 5\' 6"  (1.676 m)  Wt 180 lb (81.647 kg)  BMI 29.07 kg/m2  SpO2 100% Physical Exam  Constitutional: She is oriented to person, place, and time. She appears well-developed and well-nourished. No distress.  HENT:  Head: Normocephalic and atraumatic.  Right Ear: External ear normal.  Left Ear: External ear normal.  Nose: Nose normal.  Mouth/Throat: Oropharynx  is clear and moist. No oropharyngeal exudate.  Eyes: Conjunctivae are normal. Pupils are equal, round, and reactive to light. Right eye exhibits no discharge. Left eye exhibits no discharge. No scleral icterus.  Neck: Normal range of motion. Neck supple. No tracheal deviation present. No thyromegaly present.  Cardiovascular: Normal rate, regular rhythm, normal heart sounds and intact distal pulses.  Exam reveals no gallop and no friction rub.   No murmur heard. Pulmonary/Chest: Effort normal and breath sounds normal. No respiratory distress. She has no wheezes. She has no rales. She exhibits no tenderness.  Musculoskeletal: Normal range of motion. She exhibits no edema or tenderness.  Lymphadenopathy:    She has no cervical adenopathy.  Neurological: She is alert and  oriented to person, place, and time. No cranial nerve deficit. She exhibits normal muscle tone. Coordination normal.  Skin: Skin is warm and dry. No rash noted. She is not diaphoretic. No erythema. No pallor.  Psychiatric: Her speech is normal and behavior is normal. Judgment and thought content normal. Her mood appears anxious. She exhibits a depressed mood. She expresses no suicidal ideation.          Assessment & Plan:   Problem List Items Addressed This Visit      Unprioritized   Hypertension - Primary    BP Readings from Last 3 Encounters:  12/21/15 147/91  08/24/15 132/92  07/13/15 138/78   BP elevated in setting of anxiety and depression. Will add Amlodipine 2.5mg  daily. Continue HCTZ. Email Friday. Follow up in 4 weeks.      Relevant Medications   amLODipine (NORVASC) 2.5 MG tablet   hydrochlorothiazide (HYDRODIURIL) 25 MG tablet   Mixed anxiety and depressive disorder    Worsening anxiety and depression. Followed by Dr. Nicolasa Sparks. New counselor starting tomorrow.          Return in about 4 weeks (around 01/18/2016) for Physical.  Ronette Deter, MD Internal Medicine [facility]

## 2016-01-27 ENCOUNTER — Inpatient Hospital Stay (HOSPITAL_BASED_OUTPATIENT_CLINIC_OR_DEPARTMENT_OTHER): Payer: Managed Care, Other (non HMO) | Admitting: Internal Medicine

## 2016-01-27 ENCOUNTER — Inpatient Hospital Stay: Payer: Managed Care, Other (non HMO) | Attending: Internal Medicine

## 2016-01-27 VITALS — BP 141/93 | HR 96 | Temp 95.1°F | Resp 18 | Wt 184.5 lb

## 2016-01-27 DIAGNOSIS — Z170 Estrogen receptor positive status [ER+]: Secondary | ICD-10-CM

## 2016-01-27 DIAGNOSIS — C50911 Malignant neoplasm of unspecified site of right female breast: Secondary | ICD-10-CM

## 2016-01-27 DIAGNOSIS — Z923 Personal history of irradiation: Secondary | ICD-10-CM | POA: Diagnosis not present

## 2016-01-27 DIAGNOSIS — D519 Vitamin B12 deficiency anemia, unspecified: Secondary | ICD-10-CM | POA: Insufficient documentation

## 2016-01-27 DIAGNOSIS — Z79899 Other long term (current) drug therapy: Secondary | ICD-10-CM

## 2016-01-27 DIAGNOSIS — F329 Major depressive disorder, single episode, unspecified: Secondary | ICD-10-CM | POA: Diagnosis not present

## 2016-01-27 LAB — CBC WITH DIFFERENTIAL/PLATELET
BASOS PCT: 0 %
Basophils Absolute: 0 10*3/uL (ref 0–0.1)
Eosinophils Absolute: 0.1 10*3/uL (ref 0–0.7)
Eosinophils Relative: 2 %
HEMATOCRIT: 37 % (ref 35.0–47.0)
HEMOGLOBIN: 12.9 g/dL (ref 12.0–16.0)
Lymphocytes Relative: 22 %
Lymphs Abs: 1.4 10*3/uL (ref 1.0–3.6)
MCH: 32.3 pg (ref 26.0–34.0)
MCHC: 34.9 g/dL (ref 32.0–36.0)
MCV: 92.6 fL (ref 80.0–100.0)
MONO ABS: 0.4 10*3/uL (ref 0.2–0.9)
Monocytes Relative: 7 %
NEUTROS ABS: 4.3 10*3/uL (ref 1.4–6.5)
Neutrophils Relative %: 69 %
Platelets: 229 10*3/uL (ref 150–440)
RBC: 4 MIL/uL (ref 3.80–5.20)
RDW: 13.4 % (ref 11.5–14.5)
WBC: 6.2 10*3/uL (ref 3.6–11.0)

## 2016-01-27 LAB — COMPREHENSIVE METABOLIC PANEL
ALBUMIN: 4.4 g/dL (ref 3.5–5.0)
ALK PHOS: 75 U/L (ref 38–126)
ALT: 41 U/L (ref 14–54)
ANION GAP: 8 (ref 5–15)
AST: 23 U/L (ref 15–41)
BILIRUBIN TOTAL: 0.5 mg/dL (ref 0.3–1.2)
BUN: 14 mg/dL (ref 6–20)
CALCIUM: 9.4 mg/dL (ref 8.9–10.3)
CO2: 27 mmol/L (ref 22–32)
Chloride: 102 mmol/L (ref 101–111)
Creatinine, Ser: 0.81 mg/dL (ref 0.44–1.00)
Glucose, Bld: 101 mg/dL — ABNORMAL HIGH (ref 65–99)
POTASSIUM: 3.7 mmol/L (ref 3.5–5.1)
Sodium: 137 mmol/L (ref 135–145)
TOTAL PROTEIN: 6.9 g/dL (ref 6.5–8.1)

## 2016-01-27 MED ORDER — ERGOCALCIFEROL 1.25 MG (50000 UT) PO CAPS
50000.0000 [IU] | ORAL_CAPSULE | ORAL | Status: DC
Start: 2016-01-27 — End: 2018-02-10

## 2016-01-27 MED ORDER — EXEMESTANE 25 MG PO TABS: 25.0000 mg | ORAL_TABLET | Freq: Every day | ORAL | Status: DC

## 2016-01-27 NOTE — Progress Notes (Signed)
Patient states that she d/c the Femara in Jan. 2016 due to side effects due to mood changes and severe fatigue, joint pain, & severe night sweats.  I went to Dr. Gilford Rile and she advised me not to stop taking the Femara without discussing this with Dr. Oliva Bustard. I just quit taking these. I took if for 3 years. I just decided that I didn't want to take it anymore. My symptoms have since removed."  She denies any headaches, nausea/vomiting. Pt saw Dr. Tamala Julian last July 2016.  Last mammogram was normal. I also had my MRI due to my history of abnormal mutation." pt states she performs her monthly self breast exams.  RN Chaperoned provider with Breast Exam.

## 2016-01-27 NOTE — Progress Notes (Signed)
[facility] OFFICE PROGRESS NOTE  Patient Care Team: Ronette Deter, MD as PCP - General (Internal Medicine)   SUMMARY OF HEMATOLOGIC/ONCOLOGIC HISTORY:  # FEB 2014- BREAST CANCER STAGE II [T1N1; 1.3cm 1/12LN pos; extranodal extension; Dr. Burt Knack ]ER/PR-POS; Her 2 NEU-NEG. ; dd AC- Taxol weeklyx12 S/p RT; NOV 2014- Start Femara; Stop Dec 2016; MAY 2017- START Aromasin  # my RISK- NEG for deleterious mutation; VUS- BRIP1 gene  INTERVAL HISTORY:  This is my first interaction with the patient since I joined the practice September 2016. I reviewed the patient's prior charts/pertinent labs/imaging in detail; findings are summarized above.   A very pleasant 55 year old female patient with above history of stage II breast cancer 2014 currently is here for follow-up. Patient on adjuvant Femara- however she stopped taking it approximately 4 months ago because of multiple side effects.  Patient complains of multiple joint pains severe fatigue and body aches. Overall feeling poorly on the antihormone therapy.  Since stopping the Femara patient had noted improvement of her symptoms. She is currently denying any headaches shortness of breath or chest pain. No bone pain.   REVIEW OF SYSTEMS:  A complete 10 point review of system is done which is negative except mentioned above/history of present illness.   PAST MEDICAL HISTORY :  Past Medical History  Diagnosis Date  . Other vitamin B12 deficiency anemia   . Postmenopausal atrophic vaginitis   . Bunion   . Depression   . Other vitamin B12 deficiency anemia   . Cancer [facility])     breast  . Breast cancer ([place]) 09/2012    chemo and radiation right    PAST SURGICAL HISTORY :   Past Surgical History  Procedure Laterality Date  . Abdominal hysterectomy    . Ganglion cyst excision  01-09-10    Removed   . Bunionectomy      bilateral  . Breast clip      left breast  . Wisdom tooth extraction    . Breast excisional biopsy Right  2014    positive  . Breast biopsy Left 2002    stereotactic    FAMILY HISTORY :   Family History  Problem Relation Age of Onset  . Heart disease Father   . Skin cancer Father   . Prostate cancer Father   . Colon cancer Neg Hx   . Rectal cancer Neg Hx   . Stomach cancer Neg Hx   . Skin cancer Mother   . Heart disease Mother     afib  . Breast cancer Paternal Aunt 70  . Breast cancer Cousin 58    SOCIAL HISTORY:   Social History  Substance Use Topics  . Smoking status: Never Smoker   . Smokeless tobacco: Never Used  . Alcohol Use: Yes     Comment: occasional glass of wine    ALLERGIES:  is allergic to sulfa antibiotics and xanax.  MEDICATIONS:  Current Outpatient Prescriptions  Medication Sig Dispense Refill  . buPROPion (WELLBUTRIN) 75 MG tablet Take 150 mg by mouth 2 (two) times daily.     . Calcium Carbonate-Vitamin D (CALCIUM + D PO) Take by mouth.    . cyanocobalamin 1000 MCG tablet Take 1,000 mcg by mouth daily.    . hydrochlorothiazide (HYDRODIURIL) 25 MG tablet Take 1 tablet (25 mg total) by mouth daily. 90 tablet 3  . venlafaxine (EFFEXOR) 75 MG tablet Take 150 mg by mouth daily.     . ergocalciferol (VITAMIN D2) 50000  units capsule Take 1 capsule (50,000 Units total) by mouth once a week. 12 capsule 1  . exemestane (AROMASIN) 25 MG tablet Take 1 tablet (25 mg total) by mouth daily after breakfast. 30 tablet 6   No current facility-administered medications for this visit.    PHYSICAL EXAMINATION: ECOG PERFORMANCE STATUS: 0 - Asymptomatic  BP 141/93 mmHg  Pulse 96  Temp(Src) 95.1 F (35.1 C) (Tympanic)  Resp 18  Wt 184 lb 8.4 oz (83.7 kg)  Filed Weights   01/27/16 1501  Weight: 184 lb 8.4 oz (83.7 kg)    GENERAL: Well-nourished well-developed; Alert, no distress and comfortable.  Alone; Tearful [father passed away recently] EYES: no pallor or icterus OROPHARYNX: no thrush or ulceration; good dentition  NECK: supple, no masses felt LYMPH:  no  palpable lymphadenopathy in the cervical, axillary or inguinal regions LUNGS: clear to auscultation and  No wheeze or crackles HEART/CVS: regular rate & rhythm and no murmurs; No lower extremity edema ABDOMEN:abdomen soft, non-tender and normal bowel sounds Musculoskeletal:no cyanosis of digits and no clubbing  PSYCH: alert & oriented x 3 with fluent speech NEURO: no focal motor/sensory deficits SKIN:  no rashes or significant lesions Right and left BREAST exam [in the presence of nurse]- no unusual skin changes or dominant masses felt. Surgical scars noted.   LABORATORY DATA:  I have reviewed the data as listed    Component Value Date/Time   NA 137 01/27/2016 1442   NA 136 08/30/2014 1116   K 3.7 01/27/2016 1442   K 3.9 08/30/2014 1116   CL 102 01/27/2016 1442   CL 98 08/30/2014 1116   CO2 27 01/27/2016 1442   CO2 29 08/30/2014 1116   GLUCOSE 101* 01/27/2016 1442   GLUCOSE 115* 08/30/2014 1116   BUN 14 01/27/2016 1442   BUN 13 08/30/2014 1116   CREATININE 0.81 01/27/2016 1442   CREATININE 0.85 08/30/2014 1116   CALCIUM 9.4 01/27/2016 1442   CALCIUM 9.9 08/30/2014 1116   PROT 6.9 01/27/2016 1442   PROT 7.1 08/30/2014 1116   ALBUMIN 4.4 01/27/2016 1442   ALBUMIN 4.2 08/30/2014 1116   AST 23 01/27/2016 1442   AST 21 08/30/2014 1116   ALT 41 01/27/2016 1442   ALT 31 08/30/2014 1116   ALKPHOS 75 01/27/2016 1442   ALKPHOS 81 08/30/2014 1116   BILITOT 0.5 01/27/2016 1442   BILITOT 0.6 08/30/2014 1116   GFRNONAA >60 01/27/2016 1442   GFRNONAA >60 08/30/2014 1116   GFRNONAA >60 11/11/2013 1443   GFRAA >60 01/27/2016 1442   GFRAA >60 08/30/2014 1116   GFRAA >60 11/11/2013 1443    No results found for: SPEP, UPEP  Lab Results  Component Value Date   WBC 6.2 01/27/2016   NEUTROABS 4.3 01/27/2016   HGB 12.9 01/27/2016   HCT 37.0 01/27/2016   MCV 92.6 01/27/2016   PLT 229 01/27/2016      Chemistry      Component Value Date/Time   NA 137 01/27/2016 1442   NA 136  08/30/2014 1116   K 3.7 01/27/2016 1442   K 3.9 08/30/2014 1116   CL 102 01/27/2016 1442   CL 98 08/30/2014 1116   CO2 27 01/27/2016 1442   CO2 29 08/30/2014 1116   BUN 14 01/27/2016 1442   BUN 13 08/30/2014 1116   CREATININE 0.81 01/27/2016 1442   CREATININE 0.85 08/30/2014 1116      Component Value Date/Time   CALCIUM 9.4 01/27/2016 1442   CALCIUM 9.9 08/30/2014 1116  ALKPHOS 75 01/27/2016 1442   ALKPHOS 81 08/30/2014 1116   AST 23 01/27/2016 1442   AST 21 08/30/2014 1116   ALT 41 01/27/2016 1442   ALT 31 08/30/2014 1116   BILITOT 0.5 01/27/2016 1442   BILITOT 0.6 08/30/2014 1116        ASSESSMENT & PLAN:   # Stage II breast cancer ER/PR positive HER-2/neu negative- on adjuvant Arimidex. Clinically no evidence of recurrence. Patient stopped Femara in December 2016 because of side effects-joint pains muscle pain fatigue. Mammogram December 2016 normal limits.  # I had a long discussion the patient regarding the importance of continued antihormone therapy in prevention of recurrence especially her being stage II; and the fact that she needed chemotherapy on adjuvant basis. After a lengthy discussion patient agreed to go back on aromatase inhibitor. I recommend Aromasin; along with vitamin D 50,000 units on the weekly basis. Also recommended exercising. Patient reluctantly agreed to go back on AI.  # CBC CMP within normal limits. CA 2729 pending.  # Patient follow-up with [place] in approximately 6 months/labs.  All questions were answered. The patient knows to call the clinic with any problems, questions or concerns.   # 25 minutes face-to-face with the patient discussing the above plan of care; more than 50% of time spent on prognosis/ natural history; counseling and coordination.     Cammie Sickle, MD 01/27/2016 3:56 PM

## 2016-01-28 LAB — CANCER ANTIGEN 27.29: CA 27.29: 19.8 U/mL (ref 0.0–38.6)

## 2016-02-02 ENCOUNTER — Ambulatory Visit (INDEPENDENT_AMBULATORY_CARE_PROVIDER_SITE_OTHER): Payer: Managed Care, Other (non HMO) | Admitting: Internal Medicine

## 2016-02-02 ENCOUNTER — Encounter: Payer: Self-pay | Admitting: Internal Medicine

## 2016-02-02 VITALS — BP 140/92 | HR 73 | Temp 97.7°F | Ht 66.0 in | Wt 187.0 lb

## 2016-02-02 DIAGNOSIS — Z0000 Encounter for general adult medical examination without abnormal findings: Secondary | ICD-10-CM

## 2016-02-02 MED ORDER — AMLODIPINE BESYLATE 2.5 MG PO TABS: 2.5000 mg | ORAL_TABLET | Freq: Every day | ORAL | Status: DC

## 2016-02-02 NOTE — Assessment & Plan Note (Addendum)
General medical exam normal today including breast exam. PAP and pelvic deferred as pt s/p hysterectomy, last PAP normal 2013. Mammogram UTD and reviewed. Immunizations UTD except for flu vaccine which is declined. Colonoscopy UTD. Reviewed labs. Encouraged her to have fasting lipids checked at some point this year. Encouraged healthy diet and exercise.

## 2016-02-02 NOTE — Progress Notes (Signed)
Subjective:    Patient ID: Danielle Sparks, female    DOB: 26-May-1961, 55 y.o.   MRN: [id number]  HPI  55YO female presents for annual exam.  Feeling well. No concerns today. Father passed away 4 weeks ago secondary to GI bleed. She is coping well with this. 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 She was recently seen by oncology. She was started on Aromasin, however was not able to afford it. She plans to contact her oncologist about this.  Wt Readings from Last 3 Encounters:  02/02/16 187 lb (84.823 kg)  01/27/16 184 lb 8.4 oz (83.7 kg)  12/21/15 180 lb (81.647 kg)   BP Readings from Last 3 Encounters:  02/02/16 140/92  01/27/16 141/93  12/21/15 147/91    Past Medical History  Diagnosis Date  . Other vitamin B12 deficiency anemia   . Postmenopausal atrophic vaginitis   . Bunion   . Depression   . Other vitamin B12 deficiency anemia   . Cancer [facility])     breast  . Breast cancer ([place]) 09/2012    chemo and radiation right   Family History  Problem Relation Age of Onset  . Heart disease Father   . Skin cancer Father   . Prostate cancer Father   . Colon cancer Neg Hx   . Rectal cancer Neg Hx   . Stomach cancer Neg Hx   . Skin cancer Mother   . Heart disease Mother     afib  . Breast cancer Paternal Aunt 13  . Breast cancer Cousin 48   Past Surgical History  Procedure Laterality Date  . Abdominal hysterectomy    . Ganglion cyst excision  01-09-10    Removed   . Bunionectomy      bilateral  . Breast clip      left breast  . Wisdom tooth extraction    . Breast excisional biopsy Right 2014    positive  . Breast biopsy Left 2002    stereotactic   Social History   Social History  . Marital Status: Married    Spouse Name: N/A  . Number of Children: N/A  . Years of Education: N/A   Social History Main Topics  . Smoking status: Never Smoker   . Smokeless tobacco: Never Used  . Alcohol Use: Yes     Comment: occasional glass of wine  . Drug Use: No  . Sexual Activity:  Not Asked   Other Topics Concern  . None   Social History Narrative    Review of Systems  Constitutional: Negative for fever, chills, appetite change, fatigue and unexpected weight change.  Eyes: Negative for visual disturbance.  Respiratory: Negative for shortness of breath.   Cardiovascular: Negative for chest pain and leg swelling.  Gastrointestinal: Negative for nausea, vomiting, abdominal pain, diarrhea and constipation.  Genitourinary: Negative for dysuria.  Musculoskeletal: Negative for myalgias and arthralgias.  Skin: Negative for color change and rash.  Hematological: Negative for adenopathy. Does not bruise/bleed easily.  Psychiatric/Behavioral: Negative for sleep disturbance and dysphoric mood. The patient is not nervous/anxious.        Objective:    BP 140/92 mmHg  Pulse 73  Temp(Src) 97.7 F (36.5 C) (Oral)  Ht 5\' 6"  (1.676 m)  Wt 187 lb (84.823 kg)  BMI 30.20 kg/m2  SpO2 96% Physical Exam  Constitutional: She is oriented to person, place, and time. She appears well-developed and well-nourished. No distress.  HENT:  Head: Normocephalic and atraumatic.  Right Ear: External ear  normal.  Left Ear: External ear normal.  Nose: Nose normal.  Mouth/Throat: Oropharynx is clear and moist. No oropharyngeal exudate.  Eyes: Conjunctivae are normal. Pupils are equal, round, and reactive to light. Right eye exhibits no discharge. Left eye exhibits no discharge. No scleral icterus.  Neck: Normal range of motion. Neck supple. No tracheal deviation present. No thyromegaly present.  Cardiovascular: Normal rate, regular rhythm, normal heart sounds and intact distal pulses.  Exam reveals no gallop and no friction rub.   No murmur heard. Pulmonary/Chest: Effort normal and breath sounds normal. No accessory muscle usage. No tachypnea. No respiratory distress. She has no decreased breath sounds. She has no wheezes. She has no rhonchi. She has no rales. She exhibits no tenderness.  Right breast exhibits inverted nipple. Right breast exhibits no mass, no nipple discharge, no skin change and no tenderness. Left breast exhibits inverted nipple. Left breast exhibits no mass, no nipple discharge, no skin change and no tenderness. Breasts are symmetrical.    Abdominal: Soft. Bowel sounds are normal. She exhibits no distension and no mass. There is no tenderness. There is no rebound and no guarding.  Musculoskeletal: Normal range of motion. She exhibits no edema or tenderness.  Lymphadenopathy:    She has no cervical adenopathy.  Neurological: She is alert and oriented to person, place, and time. No cranial nerve deficit. She exhibits normal muscle tone. Coordination normal.  Skin: Skin is warm and dry. No rash noted. She is not diaphoretic. No erythema. No pallor.  Psychiatric: She has a normal mood and affect. Her behavior is normal. Judgment and thought content normal.          Assessment & Plan:   Problem List Items Addressed This Visit      Unprioritized   Routine general medical examination at a health care facility - Primary    General medical exam normal today including breast exam. PAP and pelvic deferred as pt s/p hysterectomy, last PAP normal 2013. Mammogram UTD and reviewed. Immunizations UTD except for flu vaccine which is declined. Colonoscopy UTD. Reviewed labs. Encouraged her to have fasting lipids checked at some point this year. Encouraged healthy diet and exercise.          Return in about 6 months (around 08/04/2016) for Recheck.  Ronette Deter, MD Internal Medicine [facility]

## 2016-02-02 NOTE — Patient Instructions (Signed)
Health Maintenance, Female Adopting a healthy lifestyle and getting preventive care can go a long way to promote health and wellness. Talk with your health care provider about what schedule of regular examinations is right for you. This is a good chance for you to check in with your provider about disease prevention and staying healthy. In between checkups, there are plenty of things you can do on your own. Experts have done a lot of research about which lifestyle changes and preventive measures are most likely to keep you healthy. Ask your health care provider for more information. WEIGHT AND DIET  Eat a healthy diet  Be sure to include plenty of vegetables, fruits, low-fat dairy products, and lean protein.  Do not eat a lot of foods high in solid fats, added sugars, or salt.  Get regular exercise. This is one of the most important things you can do for your health.  Most adults should exercise for at least 150 minutes each week. The exercise should increase your heart rate and make you sweat (moderate-intensity exercise).  Most adults should also do strengthening exercises at least twice a week. This is in addition to the moderate-intensity exercise.  Maintain a healthy weight  Body mass index (BMI) is a measurement that can be used to identify possible weight problems. It estimates body fat based on height and weight. Your health care provider can help determine your BMI and help you achieve or maintain a healthy weight.  For females 20 years of age and older:   A BMI below 18.5 is considered underweight.  A BMI of 18.5 to 24.9 is normal.  A BMI of 25 to 29.9 is considered overweight.  A BMI of 30 and above is considered obese.  Watch levels of cholesterol and blood lipids  You should start having your blood tested for lipids and cholesterol at 57 years of age, then have this test every 5 years.  You may need to have your cholesterol levels checked more often if:  Your lipid  or cholesterol levels are high.  You are older than 57 years of age.  You are at high risk for heart disease.  CANCER SCREENING   Lung Cancer  Lung cancer screening is recommended for adults 55-80 years old who are at high risk for lung cancer because of a history of smoking.  A yearly low-dose CT scan of the lungs is recommended for people who:  Currently smoke.  Have quit within the past 15 years.  Have at least a 30-pack-year history of smoking. A pack year is smoking an average of one pack of cigarettes a day for 1 year.  Yearly screening should continue until it has been 15 years since you quit.  Yearly screening should stop if you develop a health problem that would prevent you from having lung cancer treatment.  Breast Cancer  Practice breast self-awareness. This means understanding how your breasts normally appear and feel.  It also means doing regular breast self-exams. Let your health care provider know about any changes, no matter how small.  If you are in your 20s or 30s, you should have a clinical breast exam (CBE) by a health care provider every 1-3 years as part of a regular health exam.  If you are 40 or older, have a CBE every year. Also consider having a breast X-ray (mammogram) every year.  If you have a family history of breast cancer, talk to your health care provider about genetic screening.  If you   are at high risk for breast cancer, talk to your health care provider about having an MRI and a mammogram every year.  Breast cancer gene (BRCA) assessment is recommended for women who have family members with BRCA-related cancers. BRCA-related cancers include:  Breast.  Ovarian.  Tubal.  Peritoneal cancers.  Results of the assessment will determine the need for genetic counseling and BRCA1 and BRCA2 testing. Cervical Cancer Your health care provider may recommend that you be screened regularly for cancer of the pelvic organs (ovaries, uterus, and  vagina). This screening involves a pelvic examination, including checking for microscopic changes to the surface of your cervix (Pap test). You may be encouraged to have this screening done every 3 years, beginning at age 21.  For women ages 30-65, health care providers may recommend pelvic exams and Pap testing every 3 years, or they may recommend the Pap and pelvic exam, combined with testing for human papilloma virus (HPV), every 5 years. Some types of HPV increase your risk of cervical cancer. Testing for HPV may also be done on women of any age with unclear Pap test results.  Other health care providers may not recommend any screening for nonpregnant women who are considered low risk for pelvic cancer and who do not have symptoms. Ask your health care provider if a screening pelvic exam is right for you.  If you have had past treatment for cervical cancer or a condition that could lead to cancer, you need Pap tests and screening for cancer for at least 20 years after your treatment. If Pap tests have been discontinued, your risk factors (such as having a new sexual partner) need to be reassessed to determine if screening should resume. Some women have medical problems that increase the chance of getting cervical cancer. In these cases, your health care provider may recommend more frequent screening and Pap tests. Colorectal Cancer  This type of cancer can be detected and often prevented.  Routine colorectal cancer screening usually begins at 57 years of age and continues through 57 years of age.  Your health care provider may recommend screening at an earlier age if you have risk factors for colon cancer.  Your health care provider may also recommend using home test kits to check for hidden blood in the stool.  A small camera at the end of a tube can be used to examine your colon directly (sigmoidoscopy or colonoscopy). This is done to check for the earliest forms of colorectal  cancer.  Routine screening usually begins at age 50.  Direct examination of the colon should be repeated every 5-10 years through 57 years of age. However, you may need to be screened more often if early forms of precancerous polyps or small growths are found. Skin Cancer  Check your skin from head to toe regularly.  Tell your health care provider about any new moles or changes in moles, especially if there is a change in a mole's shape or color.  Also tell your health care provider if you have a mole that is larger than the size of a pencil eraser.  Always use sunscreen. Apply sunscreen liberally and repeatedly throughout the day.  Protect yourself by wearing long sleeves, pants, a wide-brimmed hat, and sunglasses whenever you are outside. HEART DISEASE, DIABETES, AND HIGH BLOOD PRESSURE   High blood pressure causes heart disease and increases the risk of stroke. High blood pressure is more likely to develop in:  People who have blood pressure in the high end   of the normal range (130-139/85-89 mm Hg).  People who are overweight or obese.  People who are African American.  If you are 38-23 years of age, have your blood pressure checked every 3-5 years. If you are 61 years of age or older, have your blood pressure checked every year. You should have your blood pressure measured twice--once when you are at a hospital or clinic, and once when you are not at a hospital or clinic. Record the average of the two measurements. To check your blood pressure when you are not at a hospital or clinic, you can use:  An automated blood pressure machine at a pharmacy.  A home blood pressure monitor.  If you are between 45 years and 39 years old, ask your health care provider if you should take aspirin to prevent strokes.  Have regular diabetes screenings. This involves taking a blood sample to check your fasting blood sugar level.  If you are at a normal weight and have a low risk for diabetes,  have this test once every three years after 57 years of age.  If you are overweight and have a high risk for diabetes, consider being tested at a younger age or more often. PREVENTING INFECTION  Hepatitis B  If you have a higher risk for hepatitis B, you should be screened for this virus. You are considered at high risk for hepatitis B if:  You were born in a country where hepatitis B is common. Ask your health care provider which countries are considered high risk.  Your parents were born in a high-risk country, and you have not been immunized against hepatitis B (hepatitis B vaccine).  You have HIV or AIDS.  You use needles to inject street drugs.  You live with someone who has hepatitis B.  You have had sex with someone who has hepatitis B.  You get hemodialysis treatment.  You take certain medicines for conditions, including cancer, organ transplantation, and autoimmune conditions. Hepatitis C  Blood testing is recommended for:  Everyone born from [birth year] through 1967.  Anyone with known risk factors for hepatitis C. Sexually transmitted infections (STIs)  You should be screened for sexually transmitted infections (STIs) including gonorrhea and chlamydia if:  You are sexually active and are younger than 57 years of age.  You are older than 57 years of age and your health care provider tells you that you are at risk for this type of infection.  Your sexual activity has changed since you were last screened and you are at an increased risk for chlamydia or gonorrhea. Ask your health care provider if you are at risk.  If you do not have HIV, but are at risk, it may be recommended that you take a prescription medicine daily to prevent HIV infection. This is called pre-exposure prophylaxis (PrEP). You are considered at risk if:  You are sexually active and do not regularly use condoms or know the HIV status of your partner(s).  You take drugs by injection.  You are sexually  active with a partner who has HIV. Talk with your health care provider about whether you are at high risk of being infected with HIV. If you choose to begin PrEP, you should first be tested for HIV. You should then be tested every 3 months for as long as you are taking PrEP.  PREGNANCY   If you are premenopausal and you may become pregnant, ask your health care provider about preconception counseling.  If you may  become pregnant, take 400 to 800 micrograms (mcg) of folic acid every day.  If you want to prevent pregnancy, talk to your health care provider about birth control (contraception). OSTEOPOROSIS AND MENOPAUSE   Osteoporosis is a disease in which the bones lose minerals and strength with aging. This can result in serious bone fractures. Your risk for osteoporosis can be identified using a bone density scan.  If you are 61 years of age or older, or if you are at risk for osteoporosis and fractures, ask your health care provider if you should be screened.  Ask your health care provider whether you should take a calcium or vitamin D supplement to lower your risk for osteoporosis.  Menopause may have certain physical symptoms and risks.  Hormone replacement therapy may reduce some of these symptoms and risks. Talk to your health care provider about whether hormone replacement therapy is right for you.  HOME CARE INSTRUCTIONS   Schedule regular health, dental, and eye exams.  Stay current with your immunizations.   Do not use any tobacco products including cigarettes, chewing tobacco, or electronic cigarettes.  If you are pregnant, do not drink alcohol.  If you are breastfeeding, limit how much and how often you drink alcohol.  Limit alcohol intake to no more than 1 drink per day for nonpregnant women. One drink equals 12 ounces of beer, 5 ounces of wine, or 1 ounces of hard liquor.  Do not use street drugs.  Do not share needles.  Ask your health care provider for help if  you need support or information about quitting drugs.  Tell your health care provider if you often feel depressed.  Tell your health care provider if you have ever been abused or do not feel safe at home.   This information is not intended to replace advice given to you by your health care provider. Make sure you discuss any questions you have with your health care provider.   Document Released: 03/26/2011 Document Revised: 10/01/2014 Document Reviewed: 08/12/2013 Elsevier Interactive Patient Education [employer].

## 2016-06-28 ENCOUNTER — Encounter: Payer: Self-pay | Admitting: Family Medicine

## 2016-06-28 ENCOUNTER — Ambulatory Visit (INDEPENDENT_AMBULATORY_CARE_PROVIDER_SITE_OTHER): Payer: Managed Care, Other (non HMO) | Admitting: Family Medicine

## 2016-06-28 VITALS — BP 130/90 | HR 115 | Temp 98.4°F | Wt 196.5 lb

## 2016-06-28 DIAGNOSIS — R000 Tachycardia, unspecified: Secondary | ICD-10-CM | POA: Diagnosis not present

## 2016-06-28 DIAGNOSIS — Z5181 Encounter for therapeutic drug level monitoring: Secondary | ICD-10-CM | POA: Diagnosis not present

## 2016-06-28 DIAGNOSIS — F418 Other specified anxiety disorders: Secondary | ICD-10-CM

## 2016-06-28 DIAGNOSIS — I10 Essential (primary) hypertension: Secondary | ICD-10-CM | POA: Diagnosis not present

## 2016-06-28 HISTORY — DX: Tachycardia, unspecified: R00.0

## 2016-06-28 NOTE — Assessment & Plan Note (Signed)
At goal. Continue current medications. 

## 2016-06-28 NOTE — Assessment & Plan Note (Signed)
Noted on exam. Asymptomatic. It is regular rhythm. Suspect either related to anxiety or patient's recent addition of Abilify. We'll check lab work tomorrow fasting as outlined below. She is given return precautions.

## 2016-06-28 NOTE — Patient Instructions (Signed)
Nice to meet you. Please continue to follow with your psychiatrist and counselor. You should continue medicines as advised by them. We will obtain some lab work to evaluate your increased heart rate. If you develop chest pain, shortness of breath, palpitations, or any new or changing symptoms please seek medical attention.

## 2016-06-28 NOTE — Progress Notes (Signed)
  Tommi Rumps, MD Phone: [phone]  Danielle Sparks is a 55 y.o. female who presents today for follow-up.   Depression/anxiety: Patient notes she is followed by psychiatry and counselor for this. Saw her psychiatrist 2-3 days ago. She notes she has seasonal affective disorder as well. She's currently on Effexor and Wellbutrin. She reports she had good benefit from Abilify last year. She notes typically her symptoms worsen with the winter season. Note she thinks it is the Abilify that is causing her tachycardia.  Tachycardic today on exam. No history of tachycardia. No palpitations. No chest pain or shortness of breath. Recently started on Abilify. No bleeding. No thyroid dysfunction in the past.  Hypertension: Does not check at home. Currently on amlodipine and HCTZ. No headaches. No chest pain or shortness of breath. No edema.  PMH: nonsmoker.   ROS see history of present illness  Objective  Physical Exam Vitals:   06/28/16 1558 06/28/16 1624  BP: 130/90   Pulse: (!) 119 (!) 115  Temp: 98.4 F (36.9 C)     BP Readings from Last 3 Encounters:  06/28/16 130/90  02/02/16 (!) 140/92  01/27/16 (!) 141/93   Wt Readings from Last 3 Encounters:  06/28/16 196 lb 8 oz (89.1 kg)  02/02/16 187 lb (84.8 kg)  01/27/16 184 lb 8.4 oz (83.7 kg)    Physical Exam  Constitutional: She is well-developed, well-nourished, and in no distress.  Cardiovascular: Normal heart sounds.   Tachycardic, regular rhythm  Pulmonary/Chest: Effort normal and breath sounds normal.  Neurological: She is alert. Gait normal.  Psychiatric:  Mood anxious and depressed, affect anxious     Assessment/Plan: Please see individual problem list.  Hypertension At goal. Continue current medications.  Mixed anxiety and depressive disorder Recent worsening of symptoms. Started back on Abilify by her psychiatrist. Continues Effexor and Wellbutrin. Could be that her anxiety and the Abilify are contributing  to her tachycardia. She'll monitor her symptoms and continue to follow with her counselor and psychiatrist.  Tachycardia Noted on exam. Asymptomatic. It is regular rhythm. Suspect either related to anxiety or patient's recent addition of Abilify. We'll check lab work tomorrow fasting as outlined below. She is given return precautions.   Orders Placed This Encounter  Procedures  . Lipid Profile    Standing Status:   Future    Standing Expiration Date:   06/28/2017  . HgB A1c    Standing Status:   Future    Standing Expiration Date:   06/28/2017  . CBC    Standing Status:   Future    Standing Expiration Date:   06/28/2017  . TSH    Standing Status:   Future    Standing Expiration Date:   06/28/2017  . Comp Met (CMET)    Standing Status:   Future    Standing Expiration Date:   06/28/2017    Tommi Rumps, MD [place]

## 2016-06-28 NOTE — Assessment & Plan Note (Signed)
Recent worsening of symptoms. Started back on Abilify by her psychiatrist. Continues Effexor and Wellbutrin. Could be that her anxiety and the Abilify are contributing to her tachycardia. She'll monitor her symptoms and continue to follow with her counselor and psychiatrist.

## 2016-06-29 ENCOUNTER — Telehealth: Payer: Self-pay

## 2016-06-29 ENCOUNTER — Other Ambulatory Visit (INDEPENDENT_AMBULATORY_CARE_PROVIDER_SITE_OTHER): Payer: Managed Care, Other (non HMO)

## 2016-06-29 ENCOUNTER — Ambulatory Visit: Payer: Managed Care, Other (non HMO)

## 2016-06-29 VITALS — HR 80

## 2016-06-29 DIAGNOSIS — R000 Tachycardia, unspecified: Secondary | ICD-10-CM

## 2016-06-29 DIAGNOSIS — Z5181 Encounter for therapeutic drug level monitoring: Secondary | ICD-10-CM

## 2016-06-29 LAB — COMPREHENSIVE METABOLIC PANEL
ALK PHOS: 73 U/L (ref 39–117)
ALT: 32 U/L (ref 0–35)
AST: 17 U/L (ref 0–37)
Albumin: 4.3 g/dL (ref 3.5–5.2)
BUN: 12 mg/dL (ref 6–23)
CO2: 33 meq/L — AB (ref 19–32)
Calcium: 9.8 mg/dL (ref 8.4–10.5)
Chloride: 101 mEq/L (ref 96–112)
Creatinine, Ser: 0.76 mg/dL (ref 0.40–1.20)
GFR: 83.27 mL/min (ref >60.00)
GLUCOSE: 103 mg/dL — AB (ref 70–99)
POTASSIUM: 4.1 meq/L (ref 3.5–5.1)
Sodium: 141 mEq/L (ref 135–145)
TOTAL PROTEIN: 7 g/dL (ref 6.0–8.3)
Total Bilirubin: 0.8 mg/dL (ref 0.2–1.2)

## 2016-06-29 LAB — CBC
HCT: 41 % (ref 36.0–46.0)
Hemoglobin: 14.1 g/dL (ref 12.0–15.0)
MCHC: 34.5 g/dL (ref 30.0–36.0)
MCV: 92.9 fl (ref 78.0–100.0)
Platelets: 258 10*3/uL (ref 150.0–400.0)
RBC: 4.41 Mil/uL (ref 3.87–5.11)
RDW: 14.2 % (ref 11.5–15.5)
WBC: 4.3 10*3/uL (ref 4.0–10.5)

## 2016-06-29 LAB — LIPID PANEL
CHOL/HDL RATIO: 5
Cholesterol: 271 mg/dL — ABNORMAL HIGH (ref 0–200)
HDL: 57.4 mg/dL (ref >39.00)
LDL CALC: 190 mg/dL — AB (ref 0–99)
NONHDL: 213.95
Triglycerides: 120 mg/dL (ref 0.0–149.0)
VLDL: 24 mg/dL (ref 0.0–40.0)

## 2016-06-29 LAB — TSH: TSH: 0.75 u[IU]/mL (ref 0.35–4.50)

## 2016-06-29 LAB — HEMOGLOBIN A1C: Hgb A1c MFr Bld: 5.9 % (ref 4.6–6.5)

## 2016-06-29 MED ORDER — ATORVASTATIN CALCIUM 40 MG PO TABS: 40.0000 mg | ORAL_TABLET | Freq: Every day | ORAL | 3 refills | Status: DC

## 2016-06-29 NOTE — Progress Notes (Signed)
Pulses noted. In the normal range.

## 2016-06-29 NOTE — Telephone Encounter (Signed)
Spoke to patient. Gave lab results. Patient verbalized understanding. She is willing to try cholesterol medication. Would like to make sure the cholesterol med is generic. Plans to follow instructions to diet and exercise.

## 2016-06-29 NOTE — Telephone Encounter (Signed)
I've sent in Lipitor. This should be generic. Patient will need to check on the price and if it is too expensive we can try a different medication.

## 2016-06-29 NOTE — Telephone Encounter (Signed)
Called patient. Advised per Dr. Ellen Henri previous note. Patient will call pharmacy and get info on prescription. Plans to pick up Monday.

## 2016-07-30 ENCOUNTER — Inpatient Hospital Stay: Payer: BLUE CROSS/BLUE SHIELD

## 2016-07-30 ENCOUNTER — Inpatient Hospital Stay: Payer: BLUE CROSS/BLUE SHIELD | Attending: Internal Medicine | Admitting: Internal Medicine

## 2016-07-30 DIAGNOSIS — Z853 Personal history of malignant neoplasm of breast: Secondary | ICD-10-CM | POA: Diagnosis not present

## 2016-07-30 DIAGNOSIS — Z170 Estrogen receptor positive status [ER+]: Secondary | ICD-10-CM

## 2016-07-30 DIAGNOSIS — F329 Major depressive disorder, single episode, unspecified: Secondary | ICD-10-CM

## 2016-07-30 DIAGNOSIS — Z923 Personal history of irradiation: Secondary | ICD-10-CM | POA: Diagnosis not present

## 2016-07-30 DIAGNOSIS — Z9221 Personal history of antineoplastic chemotherapy: Secondary | ICD-10-CM | POA: Diagnosis not present

## 2016-07-30 DIAGNOSIS — D519 Vitamin B12 deficiency anemia, unspecified: Secondary | ICD-10-CM | POA: Diagnosis not present

## 2016-07-30 DIAGNOSIS — C50911 Malignant neoplasm of unspecified site of right female breast: Secondary | ICD-10-CM

## 2016-07-30 DIAGNOSIS — C50811 Malignant neoplasm of overlapping sites of right female breast: Secondary | ICD-10-CM | POA: Insufficient documentation

## 2016-07-30 DIAGNOSIS — Z79899 Other long term (current) drug therapy: Secondary | ICD-10-CM

## 2016-07-30 LAB — CBC WITH DIFFERENTIAL/PLATELET
Basophils Absolute: 0 10*3/uL (ref 0–0.1)
Basophils Relative: 1 %
EOS ABS: 0.1 10*3/uL (ref 0–0.7)
EOS PCT: 2 %
HCT: 38.8 % (ref 35.0–47.0)
Hemoglobin: 13.8 g/dL (ref 12.0–16.0)
LYMPHS ABS: 0.9 10*3/uL — AB (ref 1.0–3.6)
Lymphocytes Relative: 20 %
MCH: 33.2 pg (ref 26.0–34.0)
MCHC: 35.5 g/dL (ref 32.0–36.0)
MCV: 93.5 fL (ref 80.0–100.0)
MONOS PCT: 11 %
Monocytes Absolute: 0.5 10*3/uL (ref 0.2–0.9)
Neutro Abs: 3 10*3/uL (ref 1.4–6.5)
Neutrophils Relative %: 66 %
PLATELETS: 237 10*3/uL (ref 150–440)
RBC: 4.15 MIL/uL (ref 3.80–5.20)
RDW: 13.9 % (ref 11.5–14.5)
WBC: 4.6 10*3/uL (ref 3.6–11.0)

## 2016-07-30 LAB — COMPREHENSIVE METABOLIC PANEL
ALT: 36 U/L (ref 14–54)
ANION GAP: 9 (ref 5–15)
AST: 29 U/L (ref 15–41)
Albumin: 4.4 g/dL (ref 3.5–5.0)
Alkaline Phosphatase: 79 U/L (ref 38–126)
BUN: 12 mg/dL (ref 6–20)
CHLORIDE: 100 mmol/L — AB (ref 101–111)
CO2: 32 mmol/L (ref 22–32)
Calcium: 9.4 mg/dL (ref 8.9–10.3)
Creatinine, Ser: 0.97 mg/dL (ref 0.44–1.00)
GFR calc non Af Amer: >60 mL/min (ref >60)
Glucose, Bld: 122 mg/dL — ABNORMAL HIGH (ref 65–99)
POTASSIUM: 3.8 mmol/L (ref 3.5–5.1)
SODIUM: 141 mmol/L (ref 135–145)
Total Bilirubin: 0.4 mg/dL (ref 0.3–1.2)
Total Protein: 7.2 g/dL (ref 6.5–8.1)

## 2016-07-30 NOTE — Progress Notes (Signed)
[facility] OFFICE PROGRESS NOTE  Patient Care Team: Leone Haven, MD as PCP - General (Family Medicine)   SUMMARY OF HEMATOLOGIC/ONCOLOGIC HISTORY:  Oncology History   # FEB 2014- BREAST CANCER STAGE II [T1N1; 1.3cm 1/12LN pos; extranodal extension; Dr. Burt Knack ]ER/PR-POS; Her 2 NEU-NEG. ; dd AC- Taxol weeklyx12 S/p RT; NOV 2014- Start Femara; Stop Dec 2016; MAY 2017- START Aromasin; STOPPED in [place] 2016 [joint pains]  # Chronic depression [Dr.Kapur]  # my RISK- NEG for deleterious mutation; VUS- BRIP1 gene     Carcinoma of overlapping sites of right breast in female, estrogen receptor positive ([place])     INTERVAL HISTORY:  A very pleasant 55 year old female patient with above history of stage II breast cancer 2014 currently is here for follow-up. Patient on adjuvant Femara- however she stopped taking dec 2016  because of multiple side effects Is here for follow-up.  Patient complains of multiple joint pains severe fatigue and body aches; She is currently denying any headaches shortness of breath or chest pain. No bone pain.Chronic fatigue attributed to depression.   REVIEW OF SYSTEMS:  A complete 10 point review of system is done which is negative except mentioned above/history of present illness.   PAST MEDICAL HISTORY :  Past Medical History:  Diagnosis Date  . Breast cancer ([place]) 09/2012   chemo and radiation right  . Bunion   . Cancer ([place])    breast  . Depression   . Other vitamin B12 deficiency anemia   . Other vitamin B12 deficiency anemia   . Postmenopausal atrophic vaginitis     PAST SURGICAL HISTORY :   Past Surgical History:  Procedure Laterality Date  . ABDOMINAL HYSTERECTOMY    . BREAST BIOPSY Left 2002   stereotactic  . breast clip     left breast  . BREAST EXCISIONAL BIOPSY Right 2014   positive  . BUNIONECTOMY     bilateral  . GANGLION CYST EXCISION  01-09-10   Removed   . WISDOM TOOTH EXTRACTION      FAMILY HISTORY :    Family History  Problem Relation Age of Onset  . Heart disease Father   . Skin cancer Father   . Prostate cancer Father   . Colon cancer Neg Hx   . Rectal cancer Neg Hx   . Stomach cancer Neg Hx   . Skin cancer Mother   . Heart disease Mother     afib  . Breast cancer Paternal Aunt 63  . Breast cancer Cousin 64    SOCIAL HISTORY:   Social History  Substance Use Topics  . Smoking status: Never Smoker  . Smokeless tobacco: Never Used  . Alcohol use Yes     Comment: occasional glass of wine    ALLERGIES:  is allergic to sulfa antibiotics and xanax [alprazolam].  MEDICATIONS:  Current Outpatient Prescriptions  Medication Sig Dispense Refill  . amLODipine (NORVASC) 2.5 MG tablet Take 1 tablet (2.5 mg total) by mouth daily. 90 tablet 3  . Brexpiprazole (REXULTI) 1 MG TABS Take 1 tablet by mouth daily.    Marland Kitchen buPROPion (WELLBUTRIN) 75 MG tablet Take 150 mg by mouth 2 (two) times daily.     . hydrochlorothiazide (HYDRODIURIL) 25 MG tablet Take 1 tablet (25 mg total) by mouth daily. 90 tablet 3  . venlafaxine (EFFEXOR) 75 MG tablet Take 150 mg by mouth daily.     Marland Kitchen atorvastatin (LIPITOR) 40 MG tablet Take 1 tablet (40 mg total) by  mouth daily. (Patient not taking: Reported on 07/30/2016) 90 tablet 3  . Calcium Carbonate-Vitamin D (CALCIUM + D PO) Take by mouth.    . cyanocobalamin 1000 MCG tablet Take 1,000 mcg by mouth daily.    . ergocalciferol (VITAMIN D2) 50000 units capsule Take 1 capsule (50,000 Units total) by mouth once a week. (Patient not taking: Reported on 07/30/2016) 12 capsule 1  . exemestane (AROMASIN) 25 MG tablet Take 1 tablet (25 mg total) by mouth daily after breakfast. (Patient not taking: Reported on 07/30/2016) 30 tablet 6   No current facility-administered medications for this visit.     PHYSICAL EXAMINATION: ECOG PERFORMANCE STATUS: 0 - Asymptomatic  BP 140/79 (BP Location: Left Arm, Patient Position: Sitting)   Pulse 99   Temp 97.9 F (36.6 C) (Tympanic)    Resp 18   Wt 195 lb 9.6 oz (88.7 kg)   BMI 31.57 kg/m   Filed Weights   07/30/16 1611  Weight: 195 lb 9.6 oz (88.7 kg)    GENERAL: Well-nourished well-developed; Alert, no distress and comfortable.  Alone; Tearful [father passed away recently] EYES: no pallor or icterus OROPHARYNX: no thrush or ulceration; good dentition  NECK: supple, no masses felt LYMPH:  no palpable lymphadenopathy in the cervical, axillary or inguinal regions LUNGS: clear to auscultation and  No wheeze or crackles HEART/CVS: regular rate & rhythm and no murmurs; No lower extremity edema ABDOMEN:abdomen soft, non-tender and normal bowel sounds Musculoskeletal:no cyanosis of digits and no clubbing  PSYCH: alert & oriented x 3 with fluent speech NEURO: no focal motor/sensory deficits SKIN:  no rashes or significant lesions Right and left BREAST exam [in the presence of nurse]- no unusual skin changes or dominant masses felt. Surgical scars noted.   LABORATORY DATA:  I have reviewed the data as listed    Component Value Date/Time   NA 141 07/30/2016 1450   NA 136 08/30/2014 1116   K 3.8 07/30/2016 1450   K 3.9 08/30/2014 1116   CL 100 (L) 07/30/2016 1450   CL 98 08/30/2014 1116   CO2 32 07/30/2016 1450   CO2 29 08/30/2014 1116   GLUCOSE 122 (H) 07/30/2016 1450   GLUCOSE 115 (H) 08/30/2014 1116   BUN 12 07/30/2016 1450   BUN 13 08/30/2014 1116   CREATININE 0.97 07/30/2016 1450   CREATININE 0.85 08/30/2014 1116   CALCIUM 9.4 07/30/2016 1450   CALCIUM 9.9 08/30/2014 1116   PROT 7.2 07/30/2016 1450   PROT 7.1 08/30/2014 1116   ALBUMIN 4.4 07/30/2016 1450   ALBUMIN 4.2 08/30/2014 1116   AST 29 07/30/2016 1450   AST 21 08/30/2014 1116   ALT 36 07/30/2016 1450   ALT 31 08/30/2014 1116   ALKPHOS 79 07/30/2016 1450   ALKPHOS 81 08/30/2014 1116   BILITOT 0.4 07/30/2016 1450   BILITOT 0.6 08/30/2014 1116   GFRNONAA >60 07/30/2016 1450   GFRNONAA >60 08/30/2014 1116   GFRNONAA >60 11/11/2013 1443    GFRAA >60 07/30/2016 1450   GFRAA >60 08/30/2014 1116   GFRAA >60 11/11/2013 1443    No results found for: SPEP, UPEP  Lab Results  Component Value Date   WBC 4.6 07/30/2016   NEUTROABS 3.0 07/30/2016   HGB 13.8 07/30/2016   HCT 38.8 07/30/2016   MCV 93.5 07/30/2016   PLT 237 07/30/2016      Chemistry      Component Value Date/Time   NA 141 07/30/2016 1450   NA 136 08/30/2014 1116   K  3.8 07/30/2016 1450   K 3.9 08/30/2014 1116   CL 100 (L) 07/30/2016 1450   CL 98 08/30/2014 1116   CO2 32 07/30/2016 1450   CO2 29 08/30/2014 1116   BUN 12 07/30/2016 1450   BUN 13 08/30/2014 1116   CREATININE 0.97 07/30/2016 1450   CREATININE 0.85 08/30/2014 1116      Component Value Date/Time   CALCIUM 9.4 07/30/2016 1450   CALCIUM 9.9 08/30/2014 1116   ALKPHOS 79 07/30/2016 1450   ALKPHOS 81 08/30/2014 1116   AST 29 07/30/2016 1450   AST 21 08/30/2014 1116   ALT 36 07/30/2016 1450   ALT 31 08/30/2014 1116   BILITOT 0.4 07/30/2016 1450   BILITOT 0.6 08/30/2014 1116        ASSESSMENT & PLAN:   Carcinoma of overlapping sites of right breast in female, estrogen receptor positive ([place]) # Stage II breast cancer ER/PR positive HER-2/neu negative-currently off arimidex since dec 2016 [sec to SEs] clinically NED. Mammogram December 2016 normal limits.  # Joint pains- recommend vitamin D; glucosamine chondroitin.   # Depression- not well controlled as per patient.; Following up with Dr. Nicolasa Ducking.   # CBC CMP within normal limits. CA 2729 pending.  # Recommend follow-up in one year with labs; ordered mammogram for December 2017.    Cammie Sickle, MD 07/31/2016 5:48 PM

## 2016-07-30 NOTE — Assessment & Plan Note (Addendum)
#  Stage II breast cancer ER/PR positive HER-2/neu negative-currently off arimidex since dec 2016 [sec to SEs] clinically NED. Mammogram December 2016 normal limits.  # Joint pains- recommend vitamin D; glucosamine chondroitin.   # Depression- not well controlled as per patient.; Following up with Dr. Nicolasa Ducking.   # CBC CMP within normal limits. CA 2729 pending.  # Recommend follow-up in one year with labs; ordered mammogram for December 2017.

## 2016-07-30 NOTE — Progress Notes (Signed)
Patient is here for follow up, she has pain on the right side of chest where she had surgery

## 2016-07-31 LAB — CANCER ANTIGEN 27.29: CA 27.29: 18.7 U/mL (ref 0.0–38.6)

## 2016-08-07 ENCOUNTER — Ambulatory Visit: Payer: Managed Care, Other (non HMO) | Admitting: Internal Medicine

## 2016-08-20 DIAGNOSIS — F331 Major depressive disorder, recurrent, moderate: Secondary | ICD-10-CM | POA: Diagnosis not present

## 2016-09-10 ENCOUNTER — Ambulatory Visit
Admission: RE | Admit: 2016-09-10 | Discharge: 2016-09-10 | Disposition: A | Discharge status: Home / Self Care | Payer: BLUE CROSS/BLUE SHIELD | Source: Ambulatory Visit | Attending: Internal Medicine | Admitting: Internal Medicine

## 2016-09-10 DIAGNOSIS — Z170 Estrogen receptor positive status [ER+]: Principal | ICD-10-CM

## 2016-09-10 DIAGNOSIS — R928 Other abnormal and inconclusive findings on diagnostic imaging of breast: Secondary | ICD-10-CM | POA: Diagnosis not present

## 2016-09-10 DIAGNOSIS — C50811 Malignant neoplasm of overlapping sites of right female breast: Secondary | ICD-10-CM

## 2016-10-02 ENCOUNTER — Ambulatory Visit: Payer: Managed Care, Other (non HMO) | Admitting: Family Medicine

## 2016-10-13 DIAGNOSIS — F331 Major depressive disorder, recurrent, moderate: Secondary | ICD-10-CM | POA: Diagnosis not present

## 2016-11-26 DIAGNOSIS — F331 Major depressive disorder, recurrent, moderate: Secondary | ICD-10-CM | POA: Diagnosis not present

## 2016-12-22 ENCOUNTER — Other Ambulatory Visit: Payer: Self-pay | Admitting: Family Medicine

## 2016-12-24 ENCOUNTER — Other Ambulatory Visit: Payer: Self-pay

## 2016-12-24 NOTE — Telephone Encounter (Signed)
Last OV 06/28/16 last filled 12/20/16 by Dr.Walker

## 2016-12-24 NOTE — Telephone Encounter (Signed)
Needs 6 month blood pressure follow-up set up. It has been about 6 months since her last visit. Refill sent to pharmacy.

## 2016-12-26 NOTE — Telephone Encounter (Signed)
Left message to return call 

## 2016-12-27 NOTE — Telephone Encounter (Signed)
Pt has been scheduled for a follow-up.

## 2017-01-15 DIAGNOSIS — F331 Major depressive disorder, recurrent, moderate: Secondary | ICD-10-CM | POA: Diagnosis not present

## 2017-01-16 ENCOUNTER — Ambulatory Visit (INDEPENDENT_AMBULATORY_CARE_PROVIDER_SITE_OTHER): Payer: BLUE CROSS/BLUE SHIELD | Admitting: Family Medicine

## 2017-01-16 ENCOUNTER — Encounter: Payer: Self-pay | Admitting: Family Medicine

## 2017-01-16 DIAGNOSIS — I10 Essential (primary) hypertension: Secondary | ICD-10-CM

## 2017-01-16 DIAGNOSIS — E785 Hyperlipidemia, unspecified: Secondary | ICD-10-CM | POA: Diagnosis not present

## 2017-01-16 DIAGNOSIS — E538 Deficiency of other specified B group vitamins: Secondary | ICD-10-CM

## 2017-01-16 DIAGNOSIS — F418 Other specified anxiety disorders: Secondary | ICD-10-CM

## 2017-01-16 DIAGNOSIS — H539 Unspecified visual disturbance: Secondary | ICD-10-CM | POA: Diagnosis not present

## 2017-01-16 HISTORY — DX: Unspecified visual disturbance: H53.9

## 2017-01-16 NOTE — Assessment & Plan Note (Signed)
Not at goal. Discussed option of increasing dosing of current medicines though patient wanted to monitor for several months and see if it improved with increased activity level and the decreased dose of Effexor. She'll start checking her blood pressure 1-2 times a week.

## 2017-01-16 NOTE — Assessment & Plan Note (Signed)
Patient with difficulty reading up close. Encouraged her to see ophthalmology. Encouraged artificial tears for itchiness of her eyes.

## 2017-01-16 NOTE — Assessment & Plan Note (Signed)
Patient does not want to start a medicine at this time. She does have the Lipitor at home. She will think about starting it. If she decides to start on it she will let us know and we'll need to check liver function test a month later. I did discuss that the purpose of this medicine is to help reduce her risk of stroke and heart attack.

## 2017-01-16 NOTE — Progress Notes (Signed)
Pre visit review using our clinic review tool, if applicable. No additional management support is needed unless otherwise documented below in the visit note. 

## 2017-01-16 NOTE — Assessment & Plan Note (Signed)
>>  ASSESSMENT AND PLAN FOR HYPERLIPIDEMIA WITH TARGET LOW DENSITY LIPOPROTEIN (LDL) CHOLESTEROL LESS THAN 100 MG/DL WRITTEN ON 5/74/7981  5:99 PM BY SONNENBERG, ERIC G, MD  Patient does not want to start a medicine at this time. She does have the Lipitor at home. She will think about starting it. If she decides to start on it she will let us  know and we'll need to check liver function test a month later. I did discuss that the purpose of this medicine is to help reduce her risk of stroke and heart attack.

## 2017-01-16 NOTE — Progress Notes (Signed)
  Tommi Rumps, MD Phone: [phone]  Danielle Sparks is a 56 y.o. female who presents today for f/u.  HYPERTENSION  Disease Monitoring  Home BP Monitoring not checking at home, 138/88 at [facility] Chest pain- no    Dyspnea- no Medications  Compliance-  Taking amlodpine, HCTZ  Edema- no  Anxiety/depression: Patient notes some anxiety currently. No depression. No SI or HI. She's following with psychiatry. They've been backing down on Effexor. They're going to see how she does over the next month or so and they may have to increase a given some slight increased anxiety since decreasing the dose. Currently taking Wellbutrin as well.  Patient did not start on the Lipitor. She's hesitant to do this. She has not been dieting or exercising. 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  She does take vitamin D supplementation 5000 units daily. She's previously been on B12 injections though recent checks were normal.  She reports she's having to hold things further way to read them. She notes no other changes with her vision. Does note possibly some allergies with some itchy eyes that have responded to artificial tears.   PMH: nonsmoker.   ROS see history of present illness  Objective  Physical Exam Vitals:   01/16/17 1456  BP: 128/90  Pulse: 88  Temp: 98.4 F (36.9 C)    BP Readings from Last 3 Encounters:  01/16/17 128/90  07/30/16 140/79  06/28/16 130/90   Wt Readings from Last 3 Encounters:  01/16/17 192 lb 3.2 oz (87.2 kg)  07/30/16 195 lb 9.6 oz (88.7 kg)  06/28/16 196 lb 8 oz (89.1 kg)    Physical Exam  Constitutional: No distress.  HENT:  Head: Normocephalic and atraumatic.  Mouth/Throat: Oropharynx is clear and moist. No oropharyngeal exudate.  Eyes: Conjunctivae are normal. Pupils are equal, round, and reactive to light.  Cardiovascular: Normal rate, regular rhythm and normal heart sounds.   Pulmonary/Chest: Effort normal and  breath sounds normal.  Musculoskeletal: She exhibits no edema.  Neurological: She is alert. Gait normal.  Skin: She is not diaphoretic.     Assessment/Plan: Please see individual problem list.  Hypertension Not at goal. Discussed option of increasing dosing of current medicines though patient wanted to monitor for several months and see if it improved with increased activity level and the decreased dose of Effexor. She'll start checking her blood pressure 1-2 times a week.  B12 deficiency Has been normal the last 2 checks. Discussed rechecking though she deferred.  Hyperlipidemia with target low density lipoprotein (LDL) cholesterol less than 100 mg/dL Patient does not want to start a medicine at this time. She does have the Lipitor at home. She will think about starting it. If she decides to start on it she will let us know and we'll need to check liver function test a month later. I did discuss that the purpose of this medicine is to help reduce her risk of stroke and heart attack.  Mixed anxiety and depressive disorder Somewhat improved from recently. She'll continue to follow with psychiatry.  Difficulty reading due to visual problem Patient with difficulty reading up close. Encouraged her to see ophthalmology. Encouraged artificial tears for itchiness of her eyes. Also offered to check vitamin D though she deferred this as well.  Tommi Rumps, MD [place]

## 2017-01-16 NOTE — Assessment & Plan Note (Signed)
Somewhat improved from recently. She'll continue to follow with psychiatry.

## 2017-01-16 NOTE — Patient Instructions (Signed)
Nice to see you. Please start monitoring your blood pressure 1-2 times weekly. Do this when you have been relaxing. Please continue to follow with Dr. Nicolasa Ducking. Please get your vision checked. You can use artificial tears to help with any itchiness of your eyes. If you decide to start the Lipitor please let us know so we can get she set up for follow-up lab work.

## 2017-01-16 NOTE — Assessment & Plan Note (Signed)
Has been normal the last 2 checks. Discussed rechecking though she deferred.

## 2017-01-24 ENCOUNTER — Encounter: Payer: Self-pay | Admitting: Family

## 2017-01-24 ENCOUNTER — Ambulatory Visit (INDEPENDENT_AMBULATORY_CARE_PROVIDER_SITE_OTHER): Payer: BLUE CROSS/BLUE SHIELD | Admitting: Family

## 2017-01-24 VITALS — BP 118/86 | HR 86 | Temp 98.1°F | Ht 66.0 in | Wt 187.8 lb

## 2017-01-24 DIAGNOSIS — G8929 Other chronic pain: Secondary | ICD-10-CM

## 2017-01-24 DIAGNOSIS — M545 Low back pain: Secondary | ICD-10-CM

## 2017-01-24 MED ORDER — MELOXICAM 7.5 MG PO TABS: 7.5000 mg | ORAL_TABLET | Freq: Every day | ORAL | 0 refills | Status: DC

## 2017-01-24 MED ORDER — PREDNISONE 10 MG PO TABS: ORAL_TABLET | ORAL | 0 refills | Status: DC

## 2017-01-24 NOTE — Progress Notes (Signed)
Subjective:    Patient ID: Danielle Sparks, female    DOB: 1961/04/23, 56 y.o.   MRN: [id number]  CC: Danielle Sparks is a 56 y.o. female who presents today for an acute visit.    HPI: CC: chronic low back pain 6 days, better today.   Getting up in car causes pain, feels catching and with stabbing pain across low back. "feels like pain she has had in past.'   Has been taking ibuprofen with relief.   Thinks aggravated by lifting grandchildren, working in yard. Last back flare over a year ago. Prednisone works well.   No urinary incontinence, numbness, tingling.   h/o breast cancer.   No kidney disease.        HISTORY:  Past Medical History:  Diagnosis Date  . Breast cancer ([place]) 09/2012   chemo and radiation right  . Bunion   . Cancer ([place])    breast  . Depression   . Other vitamin B12 deficiency anemia   . Other vitamin B12 deficiency anemia   . Postmenopausal atrophic vaginitis    Past Surgical History:  Procedure Laterality Date  . ABDOMINAL HYSTERECTOMY    . BREAST BIOPSY Left 2002   stereotactic  . breast clip     left breast  . BREAST EXCISIONAL BIOPSY Right 2014   positive  . BUNIONECTOMY     bilateral  . GANGLION CYST EXCISION  01-09-10   Removed   . WISDOM TOOTH EXTRACTION     Family History  Problem Relation Age of Onset  . Heart disease Father   . Skin cancer Father   . Prostate cancer Father   . Colon cancer Neg Hx   . Rectal cancer Neg Hx   . Stomach cancer Neg Hx   . Skin cancer Mother   . Heart disease Mother     afib  . Breast cancer Paternal Aunt 56  . Breast cancer Cousin 46    Allergies: Sulfa antibiotics and Xanax [alprazolam] Current Outpatient Prescriptions on File Prior to Visit  Medication Sig Dispense Refill  . amLODipine (NORVASC) 2.5 MG tablet Take 1 tablet (2.5 mg total) by mouth daily. 90 tablet 3  . buPROPion (WELLBUTRIN) 75 MG tablet Take 150 mg by mouth 2 (two) times daily.     . Calcium Carbonate-Vitamin D  (CALCIUM + D PO) Take by mouth.    . ergocalciferol (VITAMIN D2) 50000 units capsule Take 1 capsule (50,000 Units total) by mouth once a week. 12 capsule 1  . hydrochlorothiazide (HYDRODIURIL) 25 MG tablet TAKE ONE (1) TABLET EACH DAY 90 tablet 1  . venlafaxine (EFFEXOR) 75 MG tablet Take 75 mg by mouth daily.      No current facility-administered medications on file prior to visit.     Social History  Substance Use Topics  . Smoking status: Never Smoker  . Smokeless tobacco: Never Used  . Alcohol use Yes     Comment: occasional glass of wine    Review of Systems  Constitutional: Negative for chills and fever.  Respiratory: Negative for cough.   Cardiovascular: Negative for chest pain and palpitations.  Gastrointestinal: Negative for nausea and vomiting.  Musculoskeletal: Positive for back pain. Negative for gait problem.      Objective:    BP 118/86   Pulse 86   Temp 98.1 F (36.7 C) (Oral)   Ht 5\' 6"  (1.676 m)   Wt 187 lb 12.8 oz (85.2 kg)   SpO2 96%  BMI 30.31 kg/m    Physical Exam  Constitutional: She appears well-developed and well-nourished.  Eyes: Conjunctivae are normal.  Cardiovascular: Normal rate, regular rhythm, normal heart sounds and normal pulses.   Pulmonary/Chest: Effort normal and breath sounds normal. She has no wheezes. She has no rhonchi. She has no rales.  Musculoskeletal:       Lumbar back: She exhibits normal range of motion, no tenderness, no bony tenderness, no swelling, no edema, no pain and no spasm.  Full range of motion with flexion, tension, lateral side bends. No bony tenderness. No pain, numbness, tingling elicited with single leg raise bilaterally.  Mild pain elicited over SI joints, bilaterally.  Neurological: She is alert. She has normal strength. No sensory deficit.  Reflex Scores:      Patellar reflexes are 2+ on the right side and 2+ on the left side. Sensation and strength intact bilateral lower extremities.  Skin: Skin is  warm and dry.  Psychiatric: She has a normal mood and affect. Her speech is normal and behavior is normal. Thought content normal.  Vitals reviewed.      Assessment & Plan:   Problem List Items Addressed This Visit      Other   Chronic bilateral low back pain without sciatica - Primary    Symptoms most consistent with degenerative disc disease, SI joint dysfunction. No radicular symptoms. She does have a history of breast cancer and we discussed if back pain were to persist, I would advise imaging. Patient verbalized understanding. Discussed conservative therapy including topical ointments, heat. Patient also like to trial anti-inflammatory, given one-month prescription mobic and advised her to take this with food. prednisone taper has worked well for her in the past and gave patient a 6 day taper. Return precautions given.       Relevant Medications   meloxicam (MOBIC) 7.5 MG tablet   predniSONE (DELTASONE) 10 MG tablet         I am having Ms. Giddens start on meloxicam and predniSONE. I am also having her maintain her venlafaxine, buPROPion, Calcium Carbonate-Vitamin D (CALCIUM + D PO), ergocalciferol, amLODipine, and hydrochlorothiazide.   Meds ordered this encounter  Medications  . meloxicam (MOBIC) 7.5 MG tablet    Sig: Take 1 tablet (7.5 mg total) by mouth daily.    Dispense:  30 tablet    Refill:  0    Order Specific Question:   Supervising Provider    Answer:   Deborra Medina L [[id number]]  . predniSONE (DELTASONE) 10 MG tablet    Sig: Take 4 tablets ( total 40 mg) by mouth for 2 days; take 3 tablets ( total 30 mg) by mouth for 2 days; take 2 tablets ( total 20 mg) by mouth for 1 day; take 1 tablet ( total 10 mg) by mouth for 1 day.    Dispense:  17 tablet    Refill:  0    Order Specific Question:   Supervising Provider    Answer:   Crecencio Mc [[id number]]    Return precautions given.   Risks, benefits, and alternatives of the medications and treatment plan prescribed  today were discussed, and patient expressed understanding.   Education regarding symptom management and diagnosis given to patient on AVS.  Continue to follow with Tommi Rumps, MD for routine health maintenance.   [name] and I agreed with plan.   Mable Paris, FNP

## 2017-01-24 NOTE — Progress Notes (Signed)
Pre visit review using our clinic review tool, if applicable. No additional management support is needed unless otherwise documented below in the visit note. 

## 2017-01-24 NOTE — Patient Instructions (Signed)
Trial of meloxicam once a day to see if this helps with general arthritis. If you to continue this medication, please get future refills from PCP. Heat, aspercreme  Also remember to take this medication with food as it is anti-inflammatory. Prednisone 6 day taper if you should need Pleasure meeting you and let us know if your back is not better

## 2017-01-24 NOTE — Assessment & Plan Note (Signed)
Symptoms most consistent with degenerative disc disease, SI joint dysfunction. No radicular symptoms. She does have a history of breast cancer and we discussed if back pain were to persist, I would advise imaging. Patient verbalized understanding. Discussed conservative therapy including topical ointments, heat. Patient also like to trial anti-inflammatory, given one-month prescription mobic and advised her to take this with food. prednisone taper has worked well for her in the past and gave patient a 6 day taper. Return precautions given.

## 2017-02-12 ENCOUNTER — Other Ambulatory Visit: Payer: Self-pay | Admitting: Family Medicine

## 2017-04-18 DIAGNOSIS — F331 Major depressive disorder, recurrent, moderate: Secondary | ICD-10-CM | POA: Diagnosis not present

## 2017-07-04 DIAGNOSIS — F331 Major depressive disorder, recurrent, moderate: Secondary | ICD-10-CM | POA: Diagnosis not present

## 2017-07-06 ENCOUNTER — Other Ambulatory Visit: Payer: Self-pay | Admitting: Family Medicine

## 2017-07-31 ENCOUNTER — Inpatient Hospital Stay: Payer: BLUE CROSS/BLUE SHIELD | Attending: Internal Medicine

## 2017-07-31 ENCOUNTER — Inpatient Hospital Stay (HOSPITAL_BASED_OUTPATIENT_CLINIC_OR_DEPARTMENT_OTHER): Payer: BLUE CROSS/BLUE SHIELD | Admitting: Internal Medicine

## 2017-07-31 VITALS — BP 142/94 | HR 81 | Temp 98.1°F | Resp 14 | Wt 189.0 lb

## 2017-07-31 DIAGNOSIS — F329 Major depressive disorder, single episode, unspecified: Secondary | ICD-10-CM | POA: Insufficient documentation

## 2017-07-31 DIAGNOSIS — Z923 Personal history of irradiation: Secondary | ICD-10-CM | POA: Diagnosis not present

## 2017-07-31 DIAGNOSIS — Z170 Estrogen receptor positive status [ER+]: Secondary | ICD-10-CM | POA: Diagnosis not present

## 2017-07-31 DIAGNOSIS — R630 Anorexia: Secondary | ICD-10-CM | POA: Insufficient documentation

## 2017-07-31 DIAGNOSIS — Z79899 Other long term (current) drug therapy: Secondary | ICD-10-CM | POA: Diagnosis not present

## 2017-07-31 DIAGNOSIS — C50811 Malignant neoplasm of overlapping sites of right female breast: Secondary | ICD-10-CM

## 2017-07-31 DIAGNOSIS — R0602 Shortness of breath: Secondary | ICD-10-CM | POA: Diagnosis not present

## 2017-07-31 DIAGNOSIS — Z9221 Personal history of antineoplastic chemotherapy: Secondary | ICD-10-CM | POA: Insufficient documentation

## 2017-07-31 DIAGNOSIS — D519 Vitamin B12 deficiency anemia, unspecified: Secondary | ICD-10-CM

## 2017-07-31 LAB — COMPREHENSIVE METABOLIC PANEL
ALBUMIN: 4.6 g/dL (ref 3.5–5.0)
ALK PHOS: 60 U/L (ref 38–126)
ALT: 23 U/L (ref 14–54)
ANION GAP: 8 (ref 5–15)
AST: 21 U/L (ref 15–41)
BUN: 12 mg/dL (ref 6–20)
CHLORIDE: 98 mmol/L — AB (ref 101–111)
CO2: 26 mmol/L (ref 22–32)
Calcium: 9.2 mg/dL (ref 8.9–10.3)
Creatinine, Ser: 0.68 mg/dL (ref 0.44–1.00)
GFR calc non Af Amer: >60 mL/min (ref >60)
GLUCOSE: 99 mg/dL (ref 65–99)
POTASSIUM: 3.2 mmol/L — AB (ref 3.5–5.1)
SODIUM: 132 mmol/L — AB (ref 135–145)
Total Bilirubin: 0.9 mg/dL (ref 0.3–1.2)
Total Protein: 7.2 g/dL (ref 6.5–8.1)

## 2017-07-31 LAB — CBC WITH DIFFERENTIAL/PLATELET
BASOS PCT: 1 %
Basophils Absolute: 0 10*3/uL (ref 0–0.1)
Eosinophils Absolute: 0.1 10*3/uL (ref 0–0.7)
Eosinophils Relative: 1 %
HEMATOCRIT: 38.3 % (ref 35.0–47.0)
HEMOGLOBIN: 13.1 g/dL (ref 12.0–16.0)
LYMPHS ABS: 1.7 10*3/uL (ref 1.0–3.6)
LYMPHS PCT: 30 %
MCH: 32.3 pg (ref 26.0–34.0)
MCHC: 34.3 g/dL (ref 32.0–36.0)
MCV: 94.1 fL (ref 80.0–100.0)
MONO ABS: 0.4 10*3/uL (ref 0.2–0.9)
MONOS PCT: 6 %
NEUTROS ABS: 3.5 10*3/uL (ref 1.4–6.5)
NEUTROS PCT: 62 %
Platelets: 211 10*3/uL (ref 150–440)
RBC: 4.07 MIL/uL (ref 3.80–5.20)
RDW: 13.7 % (ref 11.5–14.5)
WBC: 5.7 10*3/uL (ref 3.6–11.0)

## 2017-07-31 NOTE — Progress Notes (Signed)
Danielle Sparks OFFICE PROGRESS NOTE  Patient Care Team: Leone Haven, MD as PCP - General (Family Medicine)   SUMMARY OF HEMATOLOGIC/ONCOLOGIC HISTORY:  Oncology History   # FEB 2014- BREAST CANCER STAGE II [T1N1; 1.3cm 1/12LN pos; extranodal extension; Dr.Smith ]ER/PR-POS; Her 2 NEU-NEG. ; dd AC- Taxol weeklyx12 S/p RT; NOV 2014- Start Femara; Stop Dec 2016; MAY 2017- START Aromasin; STOPPED in [place] 2016 [joint pains]  # Chronic depression [Dr.Kapur]  # my RISK- NEG for deleterious mutation; VUS- BRIP1 gene     Breast cancer, right breast ([place])   01/12/2013 Initial Diagnosis    Breast cancer, right breast ([place])       Carcinoma of overlapping sites of right breast in female, estrogen receptor positive [facility])    Radiation Therapy     The patient saw No care team member to display for radiation treatment. This is the current list of radiation treatment: Radiation Treatments    Patient's record has no active or historical radiation treatments documented.            INTERVAL HISTORY:  A very pleasant 56 year old female patient with above history of stage II breast cancer 2014 currently is here for follow-up. Patient on adjuvant Femara- however she stopped taking dec 2016  because of multiple side effects Is here for follow-up.  Patient complains of multiple joint pains severe fatigue and body aches; she also complains of worsening shortness of breath.  Denies any cough.  Complains of poor appetite.  She is very anxious.   She is currently denying any headaches or chest pain. No bone pain. Chronic fatigue attributed to depression.   REVIEW OF SYSTEMS:  A complete 10 point review of system is done which is negative except mentioned above/history of present illness.   PAST MEDICAL HISTORY :  Past Medical History:  Diagnosis Date  . Breast cancer ([place]) 09/2012   chemo and radiation right  . Bunion   . Cancer ([place])    breast  . Depression   . Other vitamin  B12 deficiency anemia   . Other vitamin B12 deficiency anemia   . Postmenopausal atrophic vaginitis     PAST SURGICAL HISTORY :   Past Surgical History:  Procedure Laterality Date  . ABDOMINAL HYSTERECTOMY    . BREAST BIOPSY Left 2002   stereotactic  . breast clip     left breast  . BREAST EXCISIONAL BIOPSY Right 2014   positive  . BUNIONECTOMY     bilateral  . GANGLION CYST EXCISION  01-09-10   Removed   . WISDOM TOOTH EXTRACTION      FAMILY HISTORY :   Family History  Problem Relation Age of Onset  . Heart disease Father   . Skin cancer Father   . Prostate cancer Father   . Colon cancer Neg Hx   . Rectal cancer Neg Hx   . Stomach cancer Neg Hx   . Skin cancer Mother   . Heart disease Mother        afib  . Breast cancer Paternal Aunt 91  . Breast cancer Cousin 8    SOCIAL HISTORY:   Social History   Tobacco Use  . Smoking status: Never Smoker  . Smokeless tobacco: Never Used  Substance Use Topics  . Alcohol use: Yes    Comment: occasional glass of wine  . Drug use: No    ALLERGIES:  is allergic to sulfa antibiotics and xanax [alprazolam].  MEDICATIONS:  Current Outpatient Medications  Medication Sig Dispense Refill  . amLODipine (NORVASC) 2.5 MG tablet TAKE 1 TABLET BY MOUTH DAILY 90 tablet 1  . buPROPion (WELLBUTRIN) 75 MG tablet Take 150 mg by mouth 2 (two) times daily.     . Calcium Carbonate-Vitamin D (CALCIUM + D PO) Take by mouth.    . ergocalciferol (VITAMIN D2) 50000 units capsule Take 1 capsule (50,000 Units total) by mouth once a week. 12 capsule 1  . hydrochlorothiazide (HYDRODIURIL) 25 MG tablet TAKE ONE TABLET EACH DAY 90 tablet 1  . meloxicam (MOBIC) 7.5 MG tablet Take 1 tablet (7.5 mg total) by mouth daily. 30 tablet 0  . venlafaxine (EFFEXOR) 75 MG tablet Take 75 mg by mouth daily.      No current facility-administered medications for this visit.     PHYSICAL EXAMINATION: ECOG PERFORMANCE STATUS: 0 - Asymptomatic  BP (!) 142/94 (BP  Location: Left Arm, Patient Position: Sitting)   Pulse 81   Temp 98.1 F (36.7 C) (Tympanic)   Resp 14   Wt 189 lb (85.7 kg)   BMI 30.51 kg/m   Filed Weights   07/31/17 1445  Weight: 189 lb (85.7 kg)    GENERAL: Well-nourished well-developed; Alert, no distress and comfortable.  Accompanied by her husband. EYES: no pallor or icterus OROPHARYNX: no thrush or ulceration; good dentition  NECK: supple, no masses felt LYMPH:  no palpable lymphadenopathy in the cervical, axillary or inguinal regions LUNGS: clear to auscultation and  No wheeze or crackles HEART/CVS: regular rate & rhythm and no murmurs; No lower extremity edema ABDOMEN:abdomen soft, non-tender and normal bowel sounds Musculoskeletal:no cyanosis of digits and no clubbing  PSYCH: alert & oriented x 3 with fluent speech NEURO: no focal motor/sensory deficits SKIN:  no rashes or significant lesions Right and left BREAST exam [in the presence of nurse]- no unusual skin changes or dominant masses felt. Surgical scars noted.   LABORATORY DATA:  I have reviewed the data as listed    Component Value Date/Time   NA 132 (L) 07/31/2017 1414   NA 136 08/30/2014 1116   K 3.2 (L) 07/31/2017 1414   K 3.9 08/30/2014 1116   CL 98 (L) 07/31/2017 1414   CL 98 08/30/2014 1116   CO2 26 07/31/2017 1414   CO2 29 08/30/2014 1116   GLUCOSE 99 07/31/2017 1414   GLUCOSE 115 (H) 08/30/2014 1116   BUN 12 07/31/2017 1414   BUN 13 08/30/2014 1116   CREATININE 0.68 07/31/2017 1414   CREATININE 0.85 08/30/2014 1116   CALCIUM 9.2 07/31/2017 1414   CALCIUM 9.9 08/30/2014 1116   PROT 7.2 07/31/2017 1414   PROT 7.1 08/30/2014 1116   ALBUMIN 4.6 07/31/2017 1414   ALBUMIN 4.2 08/30/2014 1116   AST 21 07/31/2017 1414   AST 21 08/30/2014 1116   ALT 23 07/31/2017 1414   ALT 31 08/30/2014 1116   ALKPHOS 60 07/31/2017 1414   ALKPHOS 81 08/30/2014 1116   BILITOT 0.9 07/31/2017 1414   BILITOT 0.6 08/30/2014 1116   GFRNONAA >60 07/31/2017 1414    GFRNONAA >60 08/30/2014 1116   GFRNONAA >60 11/11/2013 1443   GFRAA >60 07/31/2017 1414   GFRAA >60 08/30/2014 1116   GFRAA >60 11/11/2013 1443    No results found for: SPEP, UPEP  Lab Results  Component Value Date   WBC 5.7 07/31/2017   NEUTROABS 3.5 07/31/2017   HGB 13.1 07/31/2017   HCT 38.3 07/31/2017   MCV 94.1 07/31/2017   PLT 211 07/31/2017  Chemistry      Component Value Date/Time   NA 132 (L) 07/31/2017 1414   NA 136 08/30/2014 1116   K 3.2 (L) 07/31/2017 1414   K 3.9 08/30/2014 1116   CL 98 (L) 07/31/2017 1414   CL 98 08/30/2014 1116   CO2 26 07/31/2017 1414   CO2 29 08/30/2014 1116   BUN 12 07/31/2017 1414   BUN 13 08/30/2014 1116   CREATININE 0.68 07/31/2017 1414   CREATININE 0.85 08/30/2014 1116      Component Value Date/Time   CALCIUM 9.2 07/31/2017 1414   CALCIUM 9.9 08/30/2014 1116   ALKPHOS 60 07/31/2017 1414   ALKPHOS 81 08/30/2014 1116   AST 21 07/31/2017 1414   AST 21 08/30/2014 1116   ALT 23 07/31/2017 1414   ALT 31 08/30/2014 1116   BILITOT 0.9 07/31/2017 1414   BILITOT 0.6 08/30/2014 1116        ASSESSMENT & PLAN:   Carcinoma of overlapping sites of right breast in female, estrogen receptor positive ([place]) # Stage II breast cancer ER/PR positive HER-2/neu negative-currently off arimidex since dec 2016 [sec to SEs].   #Question recurrence based on clinical symptoms- poor appetite; multiple bone pain; worsening shortness of breath-recommend a CT chest abdomen pelvis.  # Joint pains- recommend vitamin D; glucosamine chondroitin.   # Depression- not well controlled as per patient.; Following up with Dr. Nicolasa Ducking.   #Follow-up with me in 12 months labs; order mammogram for December 2018  # 25 minutes face-to-face with the patient discussing the above plan of care; more than 50% of time spent on prognosis/ natural history; counseling and coordination.       Cammie Sickle, MD 08/06/2017 7:54 PM

## 2017-07-31 NOTE — Assessment & Plan Note (Addendum)
#  Stage II breast cancer ER/PR positive HER-2/neu negative-currently off arimidex since dec 2016 [sec to SEs].   #Question recurrence based on clinical symptoms- poor appetite; multiple bone pain; worsening shortness of breath-recommend a CT chest abdomen pelvis.  # Joint pains- recommend vitamin D; glucosamine chondroitin.   # Depression- not well controlled as per patient.; Following up with Dr. Nicolasa Ducking.   #Follow-up with me in 12 months labs; order mammogram for December 2018  # 25 minutes face-to-face with the patient discussing the above plan of care; more than 50% of time spent on prognosis/ natural history; counseling and coordination.

## 2017-08-01 LAB — CANCER ANTIGEN 27.29: CA 27.29: 17.1 U/mL (ref 0.0–38.6)

## 2017-08-07 ENCOUNTER — Ambulatory Visit: Admission: RE | Admit: 2017-08-07 | Payer: BLUE CROSS/BLUE SHIELD | Source: Ambulatory Visit

## 2017-09-13 ENCOUNTER — Ambulatory Visit
Admission: RE | Admit: 2017-09-13 | Discharge: 2017-09-13 | Disposition: A | Discharge status: Home / Self Care | Payer: BLUE CROSS/BLUE SHIELD | Source: Ambulatory Visit | Attending: Internal Medicine | Admitting: Internal Medicine

## 2017-09-13 DIAGNOSIS — M722 Plantar fascial fibromatosis: Secondary | ICD-10-CM | POA: Diagnosis not present

## 2017-09-13 DIAGNOSIS — Z170 Estrogen receptor positive status [ER+]: Principal | ICD-10-CM

## 2017-09-13 DIAGNOSIS — C50811 Malignant neoplasm of overlapping sites of right female breast: Secondary | ICD-10-CM | POA: Insufficient documentation

## 2017-09-13 DIAGNOSIS — R928 Other abnormal and inconclusive findings on diagnostic imaging of breast: Secondary | ICD-10-CM | POA: Diagnosis not present

## 2017-09-13 DIAGNOSIS — M79671 Pain in right foot: Secondary | ICD-10-CM | POA: Diagnosis not present

## 2017-10-07 DIAGNOSIS — M79671 Pain in right foot: Secondary | ICD-10-CM | POA: Diagnosis not present

## 2017-10-07 DIAGNOSIS — M722 Plantar fascial fibromatosis: Secondary | ICD-10-CM | POA: Diagnosis not present

## 2017-10-08 DIAGNOSIS — F411 Generalized anxiety disorder: Secondary | ICD-10-CM | POA: Diagnosis not present

## 2017-10-08 DIAGNOSIS — F331 Major depressive disorder, recurrent, moderate: Secondary | ICD-10-CM | POA: Diagnosis not present

## 2017-10-24 ENCOUNTER — Ambulatory Visit: Payer: BLUE CROSS/BLUE SHIELD | Admitting: Family Medicine

## 2017-12-19 ENCOUNTER — Other Ambulatory Visit: Payer: Self-pay | Admitting: Family Medicine

## 2018-01-12 ENCOUNTER — Ambulatory Visit
Admission: EM | Admit: 2018-01-12 | Discharge: 2018-01-12 | Disposition: A | Discharge status: Home / Self Care | Payer: BLUE CROSS/BLUE SHIELD | Attending: Emergency Medicine | Admitting: Emergency Medicine

## 2018-01-12 ENCOUNTER — Other Ambulatory Visit: Payer: Self-pay

## 2018-01-12 ENCOUNTER — Encounter: Payer: Self-pay | Admitting: Gynecology

## 2018-01-12 DIAGNOSIS — K0889 Other specified disorders of teeth and supporting structures: Secondary | ICD-10-CM | POA: Diagnosis not present

## 2018-01-12 MED ORDER — HYDROCODONE-ACETAMINOPHEN 5-325 MG PO TABS: 1.0000 | ORAL_TABLET | Freq: Four times a day (QID) | ORAL | 0 refills | Status: DC | PRN

## 2018-01-12 NOTE — ED Triage Notes (Signed)
Per patient c/o tooth pain x 2 days.

## 2018-01-12 NOTE — ED Provider Notes (Signed)
[facility]    CSN: [id number] Arrival date & time: 01/12/18  1512     History   Chief Complaint Chief Complaint  Patient presents with  . Dental Pain    HPI Danielle Sparks is a 57 y.o. female.   HPI  57 year old female presents with tooth pain that she has had for 2 days.  Visited her dentist several days ago and he told her to be very gentle with her brushing because of her recession of her gums over her front bottom teeth.  Today she has had severe pain.  This is particularly noticed while chewing and pushing on the top of the teeth.  In desperation, she has been using benzocaine which seems to be worsening the symptoms.  This is particularly along the gum and under her tongue where she has been painting the benzocaine liquid.  Taken Benadryl but does not seem to help.  Here because of the severe pain that she has.  She is planning on being in the dentist office 8:00 tomorrow morning.  Not appear to have any abscess of the gum itself.  She is afebrile.  Has Also used Goody powder under the gum between the gum and the buccal surface.  This is "numb" the area and made it feel somewhat better.        Past Medical History:  Diagnosis Date  . Breast cancer ([place]) 09/2012   chemo and radiation right  . Bunion   . Cancer ([place])    breast  . Depression   . Other vitamin B12 deficiency anemia   . Other vitamin B12 deficiency anemia   . Postmenopausal atrophic vaginitis     Patient Active Problem List   Diagnosis Date Noted  . Chronic bilateral low back pain without sciatica 01/24/2017  . Difficulty reading due to visual problem 01/16/2017  . Carcinoma of overlapping sites of right breast in female, estrogen receptor positive ([place]) 07/30/2016  . Tachycardia 06/28/2016  . Overweight (BMI 25.0-29.9) 07/13/2015  . Diaphoresis 06/30/2015  . H/O: depression 05/09/2015  . Breast cancer, right breast ([place]) 01/12/2013  . Breast cancer, female ([place]) 01/12/2013  .  Hyperlipidemia with target low density lipoprotein (LDL) cholesterol less than 100 mg/dL 05/13/2012  . Menopausal disorder 05/13/2012  . Leukopenia 05/13/2012  . Familial multiple lipoprotein-type hyperlipidemia 05/13/2012  . Decreased leukocytes 05/13/2012  . Menopausal and perimenopausal disorder 05/13/2012  . B12 deficiency 06/07/2011  . Hypertension 06/07/2011  . 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 Mixed anxiety and depressive disorder 06/07/2011    Past Surgical History:  Procedure Laterality Date  . ABDOMINAL HYSTERECTOMY    . BREAST BIOPSY Left 2002   stereotactic  . breast clip     left breast  . BREAST EXCISIONAL BIOPSY Right 2014   positive  . BUNIONECTOMY     bilateral  . GANGLION CYST EXCISION  01-09-10   Removed   . WISDOM TOOTH EXTRACTION      OB History   None      Home Medications    Prior to Admission medications   Medication Sig Start Date End Date Taking? Authorizing Provider  clonazePAM (KLONOPIN) 0.5 MG tablet  09/02/14  Yes [provider]  amLODipine (NORVASC) 2.5 MG tablet TAKE 1 TABLET BY MOUTH DAILY. 12/19/17   Leone Haven, MD  buPROPion (WELLBUTRIN) 75 MG tablet Take 150 mg by mouth 2 (two) times daily.  03/25/13   [provider]  Calcium Carbonate-Vitamin D (CALCIUM + D  PO) Take by mouth.    [provider]  Calcium Carbonate-Vitamin D (CALCIUM HIGH POTENCY/VITAMIN D) 600-200 MG-UNIT TABS Take by mouth.    [provider]  ergocalciferol (VITAMIN D2) 50000 units capsule Take 1 capsule (50,000 Units total) by mouth once a week. 01/27/16   Cammie Sickle, MD  hydrochlorothiazide (HYDRODIURIL) 25 MG tablet TAKE ONE TABLET EACH DAY. 12/19/17   Leone Haven, MD  HYDROcodone-acetaminophen (NORCO/VICODIN) 5-325 MG tablet Take 1-2 tablets by mouth every 6 (six) hours as needed. 01/12/18   Lorin Picket, PA-C  meloxicam (MOBIC) 7.5 MG tablet Take 1 tablet (7.5 mg total) by mouth daily.  01/24/17   Burnard Hawthorne, FNP  venlafaxine (EFFEXOR) 75 MG tablet Take 75 mg by mouth daily.     [provider]  venlafaxine XR (EFFEXOR-XR) 150 MG 24 hr capsule Take by mouth.    [provider]    Family History Family History  Problem Relation Age of Onset  . Heart disease Father   . Skin cancer Father   . Prostate cancer Father   . Skin cancer Mother   . Heart disease Mother        afib  . Breast cancer Paternal Aunt 29  . Breast cancer Cousin 58  . Colon cancer Neg Hx   . Rectal cancer Neg Hx   . Stomach cancer Neg Hx     Social History Social History   Tobacco Use  . Smoking status: Never Smoker  . Smokeless tobacco: Never Used  Substance Use Topics  . Alcohol use: Yes    Comment: occasional glass of wine  . Drug use: No     Allergies   Sulfa antibiotics and Xanax [alprazolam]   Review of Systems Review of Systems  Constitutional: Positive for activity change and appetite change. Negative for chills, fatigue and fever.  HENT: Positive for dental problem.   All other systems reviewed and are negative.    Physical Exam Triage Vital Signs ED Triage Vitals  Enc Vitals Group     BP 01/12/18 1522 (!) 158/110     Pulse Rate 01/12/18 1522 (!) 112     Resp 01/12/18 1522 16     Temp 01/12/18 1522 98.2 F (36.8 C)     Temp Source 01/12/18 1522 Oral     SpO2 01/12/18 1522 98 %     Weight 01/12/18 1526 200 lb (90.7 kg)     Height 01/12/18 1526 5\' 6"  (1.676 m)     Head Circumference --      Peak Flow --      Pain Score 01/12/18 1521 9     Pain Loc --      Pain Edu? --      Excl. in [place]? --    No data found.  Updated Vital Signs BP (!) 158/110 (BP Location: Left Arm)   Pulse (!) 112   Temp 98.2 F (36.8 C) (Oral)   Resp 16   Ht 5\' 6"  (1.676 m)   Wt 200 lb (90.7 kg)   SpO2 98%   BMI 32.28 kg/m   Visual Acuity Right Eye Distance:   Left Eye Distance:   Bilateral Distance:    Right Eye Near:   Left Eye Near:    Bilateral  Near:     Physical Exam  Constitutional: She is oriented to person, place, and time. She appears well-developed and well-nourished. No distress.  HENT:  Head: Normocephalic.  Examination shows the  patient to be comfortable.  There is discomfort with pushing on the bottom front teeth.  The gums show redness from just lateral of the 4 teeth in the front to either side.  The patient also has tenderness there.  There is mild swelling of the chin.  Eyes: Pupils are equal, round, and reactive to light. Right eye exhibits no discharge. Left eye exhibits no discharge.  Neck: Normal range of motion.  Musculoskeletal: Normal range of motion.  Lymphadenopathy:    She has no cervical adenopathy.  Neurological: She is alert and oriented to person, place, and time.  Skin: Skin is warm and dry. She is not diaphoretic.  Psychiatric: She has a normal mood and affect. Her behavior is normal. Judgment and thought content normal.  Nursing note and vitals reviewed.    UC Treatments / Results  Labs (all labs ordered are listed, but only abnormal results are displayed) Labs Reviewed - No data to display  EKG None Radiology No results found.  Procedures Procedures (including critical care time)  Medications Ordered in UC Medications - No data to display   Initial Impression / Assessment and Plan / UC Course  I have reviewed the triage vital signs and the nursing notes.  Pertinent labs & imaging results that were available during my care of the patient were reviewed by me and considered in my medical decision making (see chart for details).     Plan: 1. Test/x-ray results and diagnosis reviewed with patient 2. rx as per orders; risks, benefits, potential side effects reviewed with patient 3. Recommend supportive treatment with the use Profen 400 mg and Tylenol 500 mg combined to see if that will help decrease her pain.  I will also give her several hydrocodone severe pain and to help her sleep  tonight.  Advised against the use of additional benzocaine as well as the Goody powders.  Instead she should use cool or warm compresses whichever a more comfortable.  Needs to see her dentist first thing in the morning. [employer] substance abuse registry was checked and there was no activity under her name and birthdate. 4. F/u prn if symptoms worsen or don't improve   Final Clinical Impressions(s) / UC Diagnoses   Final diagnoses:  Toothache  Tooth pain    ED Discharge Orders        Ordered    HYDROcodone-acetaminophen (NORCO/VICODIN) 5-325 MG tablet  Every 6 hours PRN     01/12/18 1551       Controlled Substance Prescriptions [place] Controlled Substance Registry consulted? Not Applicable   Lorin Picket, PA-C 01/12/18 1606

## 2018-01-12 NOTE — Discharge Instructions (Signed)
May use ibuprofen 400 mg combined with Tylenol 500 mg taken every 6 hours as necessary for pain.  Use hydrocodone for pain that is severe.  Combined use of Tylenol and hydrocodone acetaminophen should not exceed 4 g/day.  Discontinue the use of the benzocaine for now.  Sure to follow-up with your dentist tomorrow morning.

## 2018-02-03 ENCOUNTER — Other Ambulatory Visit: Payer: Self-pay | Admitting: Family Medicine

## 2018-02-03 DIAGNOSIS — F331 Major depressive disorder, recurrent, moderate: Secondary | ICD-10-CM | POA: Diagnosis not present

## 2018-02-03 DIAGNOSIS — F411 Generalized anxiety disorder: Secondary | ICD-10-CM | POA: Diagnosis not present

## 2018-02-05 NOTE — Telephone Encounter (Signed)
Refilled: 12/19/2017 Last OV: 01/16/2017 Next OV: not scheduled

## 2018-02-05 NOTE — Telephone Encounter (Signed)
She has not been seen in over a year.  She needs to schedule an appointment for refill.  She also needs lab work done given that her sodium and potassium are mildly low on last lab work with oncology and that could be related to her hydrochlorothiazide.  Once she has an appointment scheduled for labs and for follow-up I can provide a short-term refill.

## 2018-02-06 NOTE — Telephone Encounter (Signed)
This is okay with me.  I will send in a short-term refill and she will need to see Dr. Terese Door as scheduled.  I will forward to Dr. Terese Door to let her know.

## 2018-02-06 NOTE — Telephone Encounter (Signed)
Patient notified and states she is ok with scheduling an appointment. She states that she has a friend who sees Dr.Mclean and would like to switch if possible since it is a female provider. She states she is very satisfied with seeing Dr.Sonnenberg but would just prefer a female. I have scheduled her with Dr.Mclean on Monday since she will need a refill soon. Is this ok? Ok for her to transfer?

## 2018-02-10 ENCOUNTER — Ambulatory Visit: Payer: BLUE CROSS/BLUE SHIELD | Admitting: Internal Medicine

## 2018-02-10 ENCOUNTER — Ambulatory Visit (INDEPENDENT_AMBULATORY_CARE_PROVIDER_SITE_OTHER): Payer: BLUE CROSS/BLUE SHIELD

## 2018-02-10 ENCOUNTER — Encounter: Payer: Self-pay | Admitting: Internal Medicine

## 2018-02-10 VITALS — BP 112/82 | HR 72 | Temp 97.7°F | Ht 66.0 in | Wt 188.4 lb

## 2018-02-10 DIAGNOSIS — C50911 Malignant neoplasm of unspecified site of right female breast: Secondary | ICD-10-CM

## 2018-02-10 DIAGNOSIS — Z1159 Encounter for screening for other viral diseases: Secondary | ICD-10-CM | POA: Diagnosis not present

## 2018-02-10 DIAGNOSIS — R0683 Snoring: Secondary | ICD-10-CM | POA: Diagnosis not present

## 2018-02-10 DIAGNOSIS — E785 Hyperlipidemia, unspecified: Secondary | ICD-10-CM

## 2018-02-10 DIAGNOSIS — L304 Erythema intertrigo: Secondary | ICD-10-CM

## 2018-02-10 DIAGNOSIS — R7303 Prediabetes: Secondary | ICD-10-CM | POA: Diagnosis not present

## 2018-02-10 DIAGNOSIS — Z1389 Encounter for screening for other disorder: Secondary | ICD-10-CM

## 2018-02-10 DIAGNOSIS — E559 Vitamin D deficiency, unspecified: Secondary | ICD-10-CM | POA: Diagnosis not present

## 2018-02-10 DIAGNOSIS — E538 Deficiency of other specified B group vitamins: Secondary | ICD-10-CM

## 2018-02-10 DIAGNOSIS — R0989 Other specified symptoms and signs involving the circulatory and respiratory systems: Secondary | ICD-10-CM | POA: Diagnosis not present

## 2018-02-10 DIAGNOSIS — Z1329 Encounter for screening for other suspected endocrine disorder: Secondary | ICD-10-CM

## 2018-02-10 DIAGNOSIS — F418 Other specified anxiety disorders: Secondary | ICD-10-CM

## 2018-02-10 DIAGNOSIS — R05 Cough: Secondary | ICD-10-CM

## 2018-02-10 DIAGNOSIS — N393 Stress incontinence (female) (male): Secondary | ICD-10-CM | POA: Insufficient documentation

## 2018-02-10 DIAGNOSIS — R059 Cough, unspecified: Secondary | ICD-10-CM

## 2018-02-10 DIAGNOSIS — I10 Essential (primary) hypertension: Secondary | ICD-10-CM

## 2018-02-10 DIAGNOSIS — Z13818 Encounter for screening for other digestive system disorders: Secondary | ICD-10-CM

## 2018-02-10 DIAGNOSIS — R5383 Other fatigue: Secondary | ICD-10-CM | POA: Insufficient documentation

## 2018-02-10 HISTORY — DX: Cough, unspecified: R05.9

## 2018-02-10 LAB — COMPREHENSIVE METABOLIC PANEL
ALBUMIN: 4.8 g/dL (ref 3.5–5.2)
ALK PHOS: 66 U/L (ref 39–117)
ALT: 21 U/L (ref 0–35)
AST: 14 U/L (ref 0–37)
BUN: 14 mg/dL (ref 6–23)
CO2: 30 mEq/L (ref 19–32)
CREATININE: 0.84 mg/dL (ref 0.40–1.20)
Calcium: 10 mg/dL (ref 8.4–10.5)
Chloride: 101 mEq/L (ref 96–112)
GFR: 73.77 mL/min (ref >60.00)
GLUCOSE: 102 mg/dL — AB (ref 70–99)
Potassium: 4.3 mEq/L (ref 3.5–5.1)
SODIUM: 141 meq/L (ref 135–145)
TOTAL PROTEIN: 7.2 g/dL (ref 6.0–8.3)
Total Bilirubin: 0.7 mg/dL (ref 0.2–1.2)

## 2018-02-10 LAB — URINALYSIS, ROUTINE W REFLEX MICROSCOPIC
Bilirubin Urine: NEGATIVE
HGB URINE DIPSTICK: NEGATIVE
KETONES UR: NEGATIVE
LEUKOCYTES UA: NEGATIVE
NITRITE: NEGATIVE
RBC / HPF: NONE SEEN (ref 0–?)
Specific Gravity, Urine: 1.02 (ref 1.000–1.030)
Total Protein, Urine: NEGATIVE
Urine Glucose: NEGATIVE
Urobilinogen, UA: 0.2 (ref 0.0–1.0)
pH: 6 (ref 5.0–8.0)

## 2018-02-10 LAB — CBC WITH DIFFERENTIAL/PLATELET
BASOS PCT: 0.8 % (ref 0.0–3.0)
Basophils Absolute: 0 10*3/uL (ref 0.0–0.1)
EOS ABS: 0.1 10*3/uL (ref 0.0–0.7)
Eosinophils Relative: 2.3 % (ref 0.0–5.0)
HEMATOCRIT: 40.6 % (ref 36.0–46.0)
Hemoglobin: 14.1 g/dL (ref 12.0–15.0)
LYMPHS ABS: 1.5 10*3/uL (ref 0.7–4.0)
Lymphocytes Relative: 29.8 % (ref 12.0–46.0)
MCHC: 34.6 g/dL (ref 30.0–36.0)
MCV: 92.5 fl (ref 78.0–100.0)
MONO ABS: 0.3 10*3/uL (ref 0.1–1.0)
Monocytes Relative: 5.7 % (ref 3.0–12.0)
Neutro Abs: 3.1 10*3/uL (ref 1.4–7.7)
Neutrophils Relative %: 61.4 % (ref 43.0–77.0)
PLATELETS: 291 10*3/uL (ref 150.0–400.0)
RBC: 4.39 Mil/uL (ref 3.87–5.11)
RDW: 13.7 % (ref 11.5–15.5)
WBC: 5 10*3/uL (ref 4.0–10.5)

## 2018-02-10 LAB — LIPID PANEL
CHOL/HDL RATIO: 4
Cholesterol: 266 mg/dL — ABNORMAL HIGH (ref 0–200)
HDL: 63.9 mg/dL (ref >39.00)
LDL Cholesterol: 186 mg/dL — ABNORMAL HIGH (ref 0–99)
NONHDL: 201.93
Triglycerides: 82 mg/dL (ref 0.0–149.0)
VLDL: 16.4 mg/dL (ref 0.0–40.0)

## 2018-02-10 LAB — T4, FREE: Free T4: 0.79 ng/dL (ref 0.60–1.60)

## 2018-02-10 LAB — VITAMIN D 25 HYDROXY (VIT D DEFICIENCY, FRACTURES): VITD: 38.36 ng/mL (ref 30.00–100.00)

## 2018-02-10 LAB — TSH: TSH: 0.75 u[IU]/mL (ref 0.35–4.50)

## 2018-02-10 LAB — VITAMIN B12: Vitamin B-12: 280 pg/mL (ref 211–911)

## 2018-02-10 LAB — HEMOGLOBIN A1C: HEMOGLOBIN A1C: 6 % (ref 4.6–6.5)

## 2018-02-10 MED ORDER — AMLODIPINE BESYLATE 2.5 MG PO TABS: 2.5000 mg | ORAL_TABLET | Freq: Every day | ORAL | 3 refills | Status: DC

## 2018-02-10 MED ORDER — HYDROCHLOROTHIAZIDE 25 MG PO TABS: 25.0000 mg | ORAL_TABLET | Freq: Every day | ORAL | 3 refills | Status: DC

## 2018-02-10 NOTE — Patient Instructions (Signed)
Try Zeasorb AF under breast  Calcium 600 2x per day and vitamin D3 1000 IU daily for now   Try Prilosec 20 mg 30 minutes before a meal for reflux  You could also try antihistamine at night to see if this helps   Urinary Incontinence Urinary incontinence is the involuntary loss of urine from your bladder. What are the causes? There are many causes of urinary incontinence. They include:  Medicines.  Infections.  Prostatic enlargement, leading to overflow of urine from your bladder.  Surgery.  Neurological diseases.  Emotional factors.  What are the signs or symptoms? Urinary Incontinence can be divided into four types: 1. Urge incontinence. Urge incontinence is the involuntary loss of urine before you have the opportunity to go to the bathroom. There is a sudden urge to void but not enough time to reach a bathroom. 2. Stress incontinence. Stress incontinence is the sudden loss of urine with any activity that forces urine to pass. It is commonly caused by anatomical changes to the pelvis and sphincter areas of your body. 3. Overflow incontinence. Overflow incontinence is the loss of urine from an obstructed opening to your bladder. This results in a backup of urine and a resultant buildup of pressure within the bladder. When the pressure within the bladder exceeds the closing pressure of the sphincter, the urine overflows, which causes incontinence, similar to water overflowing a dam. 4. Total incontinence. Total incontinence is the loss of urine as a result of the inability to store urine within your bladder.  How is this diagnosed? Evaluating the cause of incontinence may require:  A thorough and complete medical and obstetric history.  A complete physical exam.  Laboratory tests such as a urine culture and sensitivities.  When additional tests are indicated, they can include:  An ultrasound exam.  Kidney and bladder X-rays.  Cystoscopy. This is an exam of the bladder  using a narrow scope.  Urodynamic testing to test the nerve function to the bladder and sphincter areas.  How is this treated? Treatment for urinary incontinence depends on the cause:  For urge incontinence caused by a bacterial infection, antibiotics will be prescribed. If the urge incontinence is related to medicines you take, your health care provider may have you change the medicine.  For stress incontinence, surgery to re-establish anatomical support to the bladder or sphincter, or both, will often correct the condition.  For overflow incontinence caused by an enlarged prostate, an operation to open the channel through the enlarged prostate will allow the flow of urine out of the bladder. In women with fibroids, a hysterectomy may be recommended.  For total incontinence, surgery on your urinary sphincter may help. An artificial urinary sphincter (an inflatable cuff placed around the urethra) may be required. In women who have developed a hole-like passage between their bladder and vagina (vesicovaginal fistula), surgery to close the fistula often is required.  Follow these instructions at home:  Normal daily hygiene and the use of pads or adult diapers that are changed regularly will help prevent odors and skin damage.  Avoid caffeine. It can overstimulate your bladder.  Use the bathroom regularly. Try about every 2-3 hours to go to the bathroom, even if you do not feel the need to do so. Take time to empty your bladder completely. After urinating, wait a minute. Then try to urinate again.  For causes involving nerve dysfunction, keep a log of the medicines you take and a journal of the times you go to  the bathroom. Contact a health care provider if:  You experience worsening of pain instead of improvement in pain after your procedure.  Your incontinence becomes worse instead of better. Get help right away if:  You experience fever or shaking chills.  You are unable to pass your  urine.  You have redness spreading into your groin or down into your thighs. This information is not intended to replace advice given to you by your health care provider. Make sure you discuss any questions you have with your health care provider. Document Released: 10/18/2004 Document Revised: 04/20/2016 Document Reviewed: 02/17/2013 Elsevier Interactive Patient Education  2018 [place].   Cough, Adult Coughing is a reflex that clears your throat and your airways. Coughing helps to heal and protect your lungs. It is normal to cough occasionally, but a cough that happens with other symptoms or lasts a long time may be a sign of a condition that needs treatment. A cough may last only 2-3 weeks (acute), or it may last longer than 8 weeks (chronic). What are the causes? Coughing is commonly caused by:  Breathing in substances that irritate your lungs.  A viral or bacterial respiratory infection.  Allergies.  Asthma.  Postnasal drip.  Smoking.  Acid backing up from the stomach into the esophagus (gastroesophageal reflux).  Certain medicines.  Chronic lung problems, including COPD (or rarely, lung cancer).  Other medical conditions such as heart failure.  Follow these instructions at home: Pay attention to any changes in your symptoms. Take these actions to help with your discomfort:  Take medicines only as told by your health care provider. ? If you were prescribed an antibiotic medicine, take it as told by your health care provider. Do not stop taking the antibiotic even if you start to feel better. ? Talk with your health care provider before you take a cough suppressant medicine.  Drink enough fluid to keep your urine clear or pale yellow.  If the air is dry, use a cold steam vaporizer or humidifier in your bedroom or your home to help loosen secretions.  Avoid anything that causes you to cough at work or at home.  If your cough is worse at night, try sleeping in a  semi-upright position.  Avoid cigarette smoke. If you smoke, quit smoking. If you need help quitting, ask your health care provider.  Avoid caffeine.  Avoid alcohol.  Rest as needed.  Contact a health care provider if:  You have new symptoms.  You cough up pus.  Your cough does not get better after 2-3 weeks, or your cough gets worse.  You cannot control your cough with suppressant medicines and you are losing sleep.  You develop pain that is getting worse or pain that is not controlled with pain medicines.  You have a fever.  You have unexplained weight loss.  You have night sweats. Get help right away if:  You cough up blood.  You have difficulty breathing.  Your heartbeat is very fast. This information is not intended to replace advice given to you by your health care provider. Make sure you discuss any questions you have with your health care provider. Document Released: 03/09/2011 Document Revised: 02/16/2016 Document Reviewed: 11/17/2014 Elsevier Interactive Patient Education  2018 [employer].  Cholesterol Cholesterol is a white, waxy, fat-like substance that is needed by the human body in small amounts. The liver makes all the cholesterol we need. Cholesterol is carried from the liver by the blood through the blood vessels.  Deposits of cholesterol (plaques) may build up on blood vessel (artery) walls. Plaques make the arteries narrower and stiffer. Cholesterol plaques increase the risk for heart attack and stroke. You cannot feel your cholesterol level even if it is very high. The only way to know that it is high is to have a blood test. Once you know your cholesterol levels, you should keep a record of the test results. Work with your health care provider to keep your levels in the desired range. What do the results mean?  Total cholesterol is a rough measure of all the cholesterol in your blood.  LDL (low-density lipoprotein) is the "bad" cholesterol. This is  the type that causes plaque to build up on the artery walls. You want this level to be low.  HDL (high-density lipoprotein) is the "good" cholesterol because it cleans the arteries and carries the LDL away. You want this level to be high.  Triglycerides are fat that the body can either burn for energy or store. High levels are closely linked to heart disease. What are the desired levels of cholesterol?  Total cholesterol below 200.  LDL below 100 for people who are at risk, below 70 for people at very high risk.  HDL above 40 is good. A level of 60 or higher is considered to be protective against heart disease.  Triglycerides below 150. How can I lower my cholesterol? Diet Follow your diet program as told by your health care provider.  Choose fish or white meat chicken and [place], roasted or baked. Limit fatty cuts of red meat, fried foods, and processed meats, such as sausage and lunch meats.  Eat lots of fresh fruits and vegetables.  Choose whole grains, beans, pasta, potatoes, and cereals.  Choose olive oil, corn oil, or canola oil, and use only small amounts.  Avoid butter, mayonnaise, shortening, or palm kernel oils.  Avoid foods with trans fats.  Drink skim or nonfat milk and eat low-fat or nonfat yogurt and cheeses. Avoid whole milk, cream, ice cream, egg yolks, and full-fat cheeses.  Healthier desserts include angel food cake, ginger snaps, animal crackers, hard candy, popsicles, and low-fat or nonfat frozen yogurt. Avoid pastries, cakes, pies, and cookies.  Exercise  Follow your exercise program as told by your health care provider. A regular program: ? Helps to decrease LDL and raise HDL. ? Helps with weight control.  Do things that increase your activity level, such as gardening, walking, and taking the stairs.  Ask your health care provider about ways that you can be more active in your daily life.  Medicine  Take over-the-counter and prescription medicines  only as told by your health care provider. ? Medicine may be prescribed by your health care provider to help lower cholesterol and decrease the risk for heart disease. This is usually done if diet and exercise have failed to bring down cholesterol levels. ? If you have several risk factors, you may need medicine even if your levels are normal.  This information is not intended to replace advice given to you by your health care provider. Make sure you discuss any questions you have with your health care provider. Document Released: 06/05/2001 Document Revised: 04/07/2016 Document Reviewed: 03/10/2016 Elsevier Interactive Patient Education  2018 [place] is skin irritation or inflammation (dermatitis) that occurs when folds of skin rub together. The irritation can cause a rash and make skin raw and itchy. This condition most commonly occurs in the skin folds of  these areas:  Toes.  Armpits.  Groin.  Belly.  Breasts.  Buttocks.  Intertrigo is not passed from person to person (is not contagious). What are the causes? This condition is caused by heat, moisture, friction, and lack of air circulation. The condition can be made worse by:  Sweat.  Bacteria or a fungus, such as yeast.  What increases the risk? This condition is more likely to occur if you have moisture in your skin folds. It is also more likely to develop in people who:  Have diabetes.  Are overweight.  Are on bed rest.  Live in a warm and moist climate.  Wear splints, braces, or other medical devices.  Are not able to control their bowels or bladder (have incontinence).  What are the signs or symptoms? Symptoms of this condition include:  A pink or red skin rash.  Brown patches on the skin.  Raw or scaly skin.  Itchiness.  A burning feeling.  Bleeding.  Leaking fluid.  A bad smell.  How is this diagnosed? This condition is diagnosed with a medical history and  physical exam. You may also have a skin swab to test for bacteria or a fungus, such as yeast. How is this treated? Treatment may include:  Cleaning and drying your skin.  An oral antibiotic medicine or antibiotic skin cream for a bacterial infection.  Antifungal cream or pills for an infection that was caused by a fungus, such as yeast.  Steroid ointment to relieve itchiness and irritation.  Follow these instructions at home:  Keep the affected area clean and dry.  Do not scratch your skin.  Stay in a cool environment as much as possible. Use an air conditioner or fan, if available.  Apply over-the-counter and prescription medicines only as told by your health care provider.  If you were prescribed an antibiotic medicine, use it as told by your health care provider. Do not stop using the antibiotic even if your condition improves.  Keep all follow-up visits as told by your health care provider. This is important. How is this prevented?  Maintain a healthy weight.  Take care of your feet, especially if you have diabetes. Foot care includes: ? Wearing shoes that fit well. ? Keeping your feet dry. ? Wearing clean, breathable socks.  Protect the skin around your groin and buttocks, especially if you have incontinence. Skin protection includes: ? Following a regular cleaning routine. ? Using moisturizers and skin protectants. ? Changing protection pads frequently.  Do not wear tight clothes. Wear clothes that are loose and absorbent. Wear clothes that are made of cotton.  Wear a bra that gives good support, if needed.  Shower and dry yourself thoroughly after activity. Use a hair dryer on a cool setting to dry between skin folds, especially after you bathe.  If you have diabetes, keep your blood sugar under control. Contact a health care provider if:  Your symptoms do not improve with treatment.  Your symptoms get worse or they spread.  You notice increased redness and  warmth.  You have a fever. This information is not intended to replace advice given to you by your health care provider. Make sure you discuss any questions you have with your health care provider. Document Released: 09/10/2005 Document Revised: 02/16/2016 Document Reviewed: 03/14/2015 Elsevier Interactive Patient Education  2018 [employer].

## 2018-02-10 NOTE — Progress Notes (Addendum)
Chief Complaint  Patient presents with  . Follow-up    transfer of care   Establish care transfer of care  1. HTN on norvasc 2.5 mg qd, hctz 25 mg qd 2. C/o cough since the winter ? H/o allergies no heartburn per pt  3. C/o stress urinary incontinence with #2  4. H/o depression psych is Dr. Ardyth Man on zoloft 25 qd and wellburtin 150 mg bid doing well and likes therapist Valorie  5. Had rash to right under breast to the side  6. C/o snoring and interested in sleep study     Review of Systems  Constitutional: Positive for malaise/fatigue. Negative for weight loss.  HENT: Negative for hearing loss.   Eyes: Negative for blurred vision.  Respiratory: Negative for shortness of breath.   Cardiovascular: Negative for chest pain.  Gastrointestinal: Negative for heartburn.  Musculoskeletal: Negative for falls.  Skin: Positive for rash.  Neurological: Negative for headaches.  Psychiatric/Behavioral: Negative for depression.   Past Medical History:  Diagnosis Date  . Breast cancer ([place]) 09/2012   chemo and radiation right  . Bunion   . Cancer ([place])    breast  . Depression   . Other vitamin B12 deficiency anemia   . Other vitamin B12 deficiency anemia   . Postmenopausal atrophic vaginitis    Past Surgical History:  Procedure Laterality Date  . ABDOMINAL HYSTERECTOMY    . BREAST BIOPSY Left 2002   stereotactic  . breast clip     left breast  . BREAST EXCISIONAL BIOPSY Right 2014   positive  . BUNIONECTOMY     bilateral  . GANGLION CYST EXCISION  01-09-10   Removed   . WISDOM TOOTH EXTRACTION     Family History  Problem Relation Age of Onset  . Heart disease Father   . Skin cancer Father   . Prostate cancer Father   . Skin cancer Mother   . Heart disease Mother        afib  . Breast cancer Paternal Aunt 35  . Breast cancer Cousin 72  . Colon cancer Neg Hx   . Rectal cancer Neg Hx   . Stomach cancer Neg Hx    Social History   Socioeconomic History  . Marital  status: Married    Spouse name: Not on file  . Number of children: Not on file  . Years of education: Not on file  . Highest education level: Not on file  Occupational History  . Not on file  Social Needs  . Financial resource strain: Not on file  . Food insecurity:    Worry: Not on file    Inability: Not on file  . Transportation needs:    Medical: Not on file    Non-medical: Not on file  Tobacco Use  . Smoking status: Never Smoker  . Smokeless tobacco: Never Used  Substance and Sexual Activity  . Alcohol use: Yes    Comment: occasional glass of wine  . Drug use: No  . Sexual activity: Not on file  Lifestyle  . Physical activity:    Days per week: Not on file    Minutes per session: Not on file  . Stress: Not on file  Relationships  . Social connections:    Talks on phone: Not on file    Gets together: Not on file    Attends religious service: Not on file    Active member of club or organization: Not on file    Attends  meetings of clubs or organizations: Not on file    Relationship status: Not on file  . Intimate partner violence:    Fear of current or ex partner: Not on file    Emotionally abused: Not on file    Physically abused: Not on file    Forced sexual activity: Not on file  Other Topics Concern  . Not on file  Social History Narrative  . Not on file   Current Meds  Medication Sig  . amLODipine (NORVASC) 2.5 MG tablet Take 1 tablet (2.5 mg total) by mouth daily.  Marland Kitchen buPROPion (WELLBUTRIN) 75 MG tablet Take 150 mg by mouth 2 (two) times daily.   . clonazePAM (KLONOPIN) 0.5 MG tablet   . hydrochlorothiazide (HYDRODIURIL) 25 MG tablet Take 1 tablet (25 mg total) by mouth daily.  Marland Kitchen HYDROcodone-acetaminophen (NORCO/VICODIN) 5-325 MG tablet Take 1-2 tablets by mouth every 6 (six) hours as needed.  . meloxicam (MOBIC) 7.5 MG tablet Take 1 tablet (7.5 mg total) by mouth daily.  . sertraline (ZOLOFT) 25 MG tablet 25 mg daily.   . [DISCONTINUED] amLODipine  (NORVASC) 2.5 MG tablet TAKE 1 TABLET BY MOUTH DAILY.  . [DISCONTINUED] hydrochlorothiazide (HYDRODIURIL) 25 MG tablet TAKE 1 TABLET BY MOUTH ONCE A DAY   Allergies  Allergen Reactions  . Sulfa Antibiotics Itching    Rash under the skin  . Xanax [Alprazolam] Other (See Comments)    Night terrors   No results found for this or any previous visit (from the past 2160 hour(s)). Objective  Body mass index is 30.41 kg/m. Wt Readings from Last 3 Encounters:  02/10/18 188 lb 6.4 oz (85.5 kg)  07/31/17 189 lb (85.7 kg)  01/24/17 187 lb 12.8 oz (85.2 kg)   Temp Readings from Last 3 Encounters:  02/10/18 97.7 F (36.5 C) (Oral)  07/31/17 98.1 F (36.7 C) (Tympanic)  01/24/17 98.1 F (36.7 C) (Oral)   BP Readings from Last 3 Encounters:  02/10/18 112/82  07/31/17 (!) 142/94  01/24/17 118/86   Pulse Readings from Last 3 Encounters:  02/10/18 72  07/31/17 81  01/24/17 86    Physical Exam  Constitutional: She is oriented to person, place, and time. Vital signs are normal. She appears well-developed and well-nourished. She is cooperative.  HENT:  Head: Normocephalic and atraumatic.  Mouth/Throat: Oropharynx is clear and moist and mucous membranes are normal.  Eyes: Pupils are equal, round, and reactive to light. Conjunctivae are normal.  Cardiovascular: Normal rate, regular rhythm and normal heart sounds.  Pulmonary/Chest: Effort normal and breath sounds normal.  Neurological: She is alert and oriented to person, place, and time. Gait normal.  Skin: Skin is warm, dry and intact.  Psychiatric: She has a normal mood and affect. Her speech is normal and behavior is normal. Judgment and thought content normal. Cognition and memory are normal.  Nursing note and vitals reviewed.   Assessment   1. HTN  2. Cough  3. Stress urinary incontinence  4. Depression 5. Intertrigo  6. Snoring  7. Fatigue  8. HM  Plan  1.  Cont meds norvasc 2.5 qd, hctz 25 mg qd  2.  CXR Trial of PPI,  antihistamine  3. Disc kegels  4.  F/u psych  5.  Clotrimazole, zeasorb af  6. Refer Lb Pulm sleep study  7.  Labs today  8.  Had flu shot  Disc shingrix today  tdap had 8/201/3   dexa 09/07/15 osteopenia  07/03/12 diverticulosis h/o hyperplastic polyp f/u in  10 years  S/p hysterectomy for DUB, fibroids pap neg 05/13/12  Dx mammo due 09/13/18 neg 2018  H/o right breast cancer 5 years ago   Reviewed Dr. Nicolasa Ducking notes seen 05/19/18 rec increase zoloft to 150 mg daily rec CBT on wellbutrin 75 mg 2 pills in am and 2 pills in pm   Provider: Dr. Olivia Mackie McLean-Scocuzza-Internal Medicine

## 2018-02-10 NOTE — Progress Notes (Signed)
Pre visit review using our clinic review tool, if applicable. No additional management support is needed unless otherwise documented below in the visit note. 

## 2018-02-11 ENCOUNTER — Other Ambulatory Visit: Payer: Self-pay | Admitting: Internal Medicine

## 2018-02-11 DIAGNOSIS — E785 Hyperlipidemia, unspecified: Secondary | ICD-10-CM

## 2018-02-11 LAB — HEPATITIS B SURFACE ANTIBODY, QUANTITATIVE: Hepatitis B-Post: <5 m[IU]/mL — ABNORMAL LOW (ref >=10)

## 2018-02-11 LAB — HEPATITIS C ANTIBODY
HEP C AB: NONREACTIVE
SIGNAL TO CUT-OFF: 0.01 (ref <1.00)

## 2018-02-11 MED ORDER — ATORVASTATIN CALCIUM 20 MG PO TABS: 20.0000 mg | ORAL_TABLET | Freq: Every day | ORAL | 0 refills | Status: DC

## 2018-02-21 DIAGNOSIS — F331 Major depressive disorder, recurrent, moderate: Secondary | ICD-10-CM | POA: Diagnosis not present

## 2018-02-21 DIAGNOSIS — F411 Generalized anxiety disorder: Secondary | ICD-10-CM | POA: Diagnosis not present

## 2018-02-24 ENCOUNTER — Institutional Professional Consult (permissible substitution): Payer: BLUE CROSS/BLUE SHIELD | Admitting: Pulmonary Disease

## 2018-03-07 DIAGNOSIS — F331 Major depressive disorder, recurrent, moderate: Secondary | ICD-10-CM | POA: Diagnosis not present

## 2018-03-07 DIAGNOSIS — F411 Generalized anxiety disorder: Secondary | ICD-10-CM | POA: Diagnosis not present

## 2018-03-18 ENCOUNTER — Ambulatory Visit (INDEPENDENT_AMBULATORY_CARE_PROVIDER_SITE_OTHER): Payer: BLUE CROSS/BLUE SHIELD | Admitting: Pulmonary Disease

## 2018-03-18 ENCOUNTER — Encounter: Payer: Self-pay | Admitting: Pulmonary Disease

## 2018-03-18 VITALS — BP 120/82 | HR 74 | Ht 66.0 in | Wt 193.0 lb

## 2018-03-18 DIAGNOSIS — G4719 Other hypersomnia: Secondary | ICD-10-CM

## 2018-03-18 DIAGNOSIS — J310 Chronic rhinitis: Secondary | ICD-10-CM

## 2018-03-18 DIAGNOSIS — R0683 Snoring: Secondary | ICD-10-CM

## 2018-03-18 MED ORDER — FLUTICASONE PROPIONATE 50 MCG/ACT NA SUSP: 2.0000 | Freq: Every day | NASAL | 10 refills | Status: DC

## 2018-03-18 NOTE — Patient Instructions (Signed)
Flonase nasal spray-2 sprays per nostril daily Sleep study ordered We will contact you after the sleep study has been performed and interpreted to initiate therapy if indicated We will schedule follow-up with you depending on results of sleep study and decisions regarding treatment

## 2018-03-19 NOTE — Progress Notes (Signed)
PULMONARY/SLEEP CONSULT NOTE  Requesting MD/Service: McLean-Scocuzza Date of initial consultation: 03/18/18 Reason for consultation: Snoring, daytime hypersomnolence  PT PROFILE: 57 y.o. female never smoker referred for heavy snoring and daytime hypersomnolence  DATA:  INTERVAL:  HPI:  As above.  She reports that she was house sitting with a friend and her friend reported to her that she is a very heavy snorer.  Her friend was sleeping in a different room and the patient's snoring disrupted her sleep.  Patient has slept in a room separate from her husband for many years due to snoring.  She reports chronic fatigue and daytime hypersomnolence.  Epworth sleepiness scale score 13.  She also notes chronic cough, nasal congestion and posterior nasal drainage.  She reports a globus sensation in the back of her throat.  She is recently been started on proton pump inhibitor for chronic cough with some improvement.Denies CP, fever, purulent sputum, hemoptysis, LE edema and calf tenderness.  Past Medical History:  Diagnosis Date  . Breast cancer ([place]) 09/2012   chemo and radiation right  . Bunion   . Cancer ([place])    breast  . Depression   . Other vitamin B12 deficiency anemia   . Other vitamin B12 deficiency anemia   . Postmenopausal atrophic vaginitis     Past Surgical History:  Procedure Laterality Date  . ABDOMINAL HYSTERECTOMY    . BREAST BIOPSY Left 2002   stereotactic  . breast clip     left breast  . BREAST EXCISIONAL BIOPSY Right 2014   positive  . BUNIONECTOMY     bilateral  . GANGLION CYST EXCISION  01-09-10   Removed   . WISDOM TOOTH EXTRACTION      MEDICATIONS: I have reviewed all medications and confirmed regimen as documented  Social History   Socioeconomic History  . Marital status: Married    Spouse name: Not on file  . Number of children: Not on file  . Years of education: Not on file  . Highest education level: Not on file  Occupational History  . Not on  file  Social Needs  . Financial resource strain: Not on file  . Food insecurity:    Worry: Not on file    Inability: Not on file  . Transportation needs:    Medical: Not on file    Non-medical: Not on file  Tobacco Use  . Smoking status: Never Smoker  . Smokeless tobacco: Never Used  Substance and Sexual Activity  . Alcohol use: Yes    Comment: occasional glass of wine  . Drug use: No  . Sexual activity: Not on file  Lifestyle  . Physical activity:    Days per week: Not on file    Minutes per session: Not on file  . Stress: Not on file  Relationships  . Social connections:    Talks on phone: Not on file    Gets together: Not on file    Attends religious service: Not on file    Active member of club or organization: Not on file    Attends meetings of clubs or organizations: Not on file    Relationship status: Not on file  . Intimate partner violence:    Fear of current or ex partner: Not on file    Emotionally abused: Not on file    Physically abused: Not on file    Forced sexual activity: Not on file  Other Topics Concern  . Not on file  Social History Narrative  Married    2 kids    Used to own [employer]     Family History  Problem Relation Age of Onset  . Heart disease Father   . Skin cancer Father   . Prostate cancer Father   . COPD Father   . Skin cancer Mother   . Heart disease Mother        afib  . Hyperlipidemia Mother   . Breast cancer Paternal Aunt 62  . Breast cancer Cousin 23  . Hyperlipidemia Sister   . Colon cancer Neg Hx   . Rectal cancer Neg Hx   . Stomach cancer Neg Hx     ROS: No fever, myalgias/arthralgias, unexplained weight loss or weight gain No new focal weakness or sensory deficits No otalgia, hearing loss, visual changes, nasal and sinus symptoms, mouth and throat problems No neck pain or adenopathy No abdominal pain, N/V/D, diarrhea, change in bowel pattern No dysuria, change in urinary pattern   Vitals:    03/18/18 0936 03/18/18 0949  BP:  120/82  Pulse:  74  SpO2:  98%  Weight: 193 lb (87.5 kg)   Height: 5\' 6"  (1.676 m)      EXAM:  Gen: Moderately obese WDWN, No overt respiratory distress HEENT: NCAT, sclera white, small oral pharyngeal aperture, moderate bilateral rhinitis Neck: Supple without LAN, thyromegaly, JVD Lungs: breath sounds full without wheezes or other adventitious sounds Cardiovascular: RRR, no murmurs noted Abdomen: Soft, nontender, normal BS Ext: without clubbing, cyanosis, edema Neuro: CNs grossly intact, motor and sensory intact Skin: Limited exam, no lesions noted  DATA:   BMP Latest Ref Rng & Units 02/10/2018 07/31/2017 07/30/2016  Glucose 70 - 99 mg/dL 102(H) 99 122(H)  BUN 6 - 23 mg/dL 14 12 12   Creatinine 0.40 - 1.20 mg/dL 0.84 0.68 0.97  Sodium 135 - 145 mEq/L 141 132(L) 141  Potassium 3.5 - 5.1 mEq/L 4.3 3.2(L) 3.8  Chloride 96 - 112 mEq/L 101 98(L) 100(L)  CO2 19 - 32 mEq/L 30 26 32  Calcium 8.4 - 10.5 mg/dL 10.0 9.2 9.4    CBC Latest Ref Rng & Units 02/10/2018 07/31/2017 07/30/2016  WBC 4.0 - 10.5 K/uL 5.0 5.7 4.6  Hemoglobin 12.0 - 15.0 g/dL 14.1 13.1 13.8  Hematocrit 36.0 - 46.0 % 40.6 38.3 38.8  Platelets 150.0 - 400.0 K/uL 291.0 211 237    CXR 02/10/2018: No acute findings  I have personally reviewed all chest radiographs reported above including CXRs and CT chest unless otherwise indicated  IMPRESSION:     ICD-10-CM   1. Daytime hypersomnolence G47.19 Home sleep test  2. Snoring R06.83 Home sleep test  3. Chronic rhinitis J31.0    OSA highly suspected  PLAN:  Flonase nasal spray, 2 sprays per nostril daily Sleep study ordered We will contact her after sleep study results are available to initiate therapy if indicated Office follow-up will be arranged as indicated   Merton Border, MD PCCM service Mobile [phone] Pager [phone] 03/19/2018 11:31 AM

## 2018-04-14 ENCOUNTER — Encounter: Payer: Self-pay | Admitting: Internal Medicine

## 2018-04-14 DIAGNOSIS — G4733 Obstructive sleep apnea (adult) (pediatric): Secondary | ICD-10-CM | POA: Diagnosis not present

## 2018-04-14 DIAGNOSIS — G4719 Other hypersomnia: Secondary | ICD-10-CM

## 2018-04-14 DIAGNOSIS — R0683 Snoring: Secondary | ICD-10-CM

## 2018-04-18 ENCOUNTER — Telehealth: Payer: Self-pay | Admitting: *Deleted

## 2018-04-18 DIAGNOSIS — G4733 Obstructive sleep apnea (adult) (pediatric): Secondary | ICD-10-CM

## 2018-04-18 NOTE — Telephone Encounter (Signed)
Tried to call pt but VM was full and couldn't LM. Pt has moderate sleep apnea w/AHI 20.6. Recommendations are auto CPAP of 5-12cm H2O.

## 2018-04-21 NOTE — Telephone Encounter (Signed)
Pt informed of results. Nothing further needed. 

## 2018-05-19 DIAGNOSIS — F331 Major depressive disorder, recurrent, moderate: Secondary | ICD-10-CM | POA: Diagnosis not present

## 2018-05-19 DIAGNOSIS — F411 Generalized anxiety disorder: Secondary | ICD-10-CM | POA: Diagnosis not present

## 2018-05-22 ENCOUNTER — Encounter: Payer: Self-pay | Admitting: Internal Medicine

## 2018-05-22 ENCOUNTER — Ambulatory Visit: Payer: BLUE CROSS/BLUE SHIELD | Admitting: Internal Medicine

## 2018-05-22 VITALS — BP 146/40 | HR 72 | Temp 98.2°F | Ht 66.0 in | Wt 189.0 lb

## 2018-05-22 DIAGNOSIS — Z1159 Encounter for screening for other viral diseases: Secondary | ICD-10-CM

## 2018-05-22 DIAGNOSIS — Z23 Encounter for immunization: Secondary | ICD-10-CM | POA: Diagnosis not present

## 2018-05-22 DIAGNOSIS — Z853 Personal history of malignant neoplasm of breast: Secondary | ICD-10-CM | POA: Diagnosis not present

## 2018-05-22 DIAGNOSIS — C50911 Malignant neoplasm of unspecified site of right female breast: Secondary | ICD-10-CM

## 2018-05-22 DIAGNOSIS — L309 Dermatitis, unspecified: Secondary | ICD-10-CM | POA: Diagnosis not present

## 2018-05-22 DIAGNOSIS — R7303 Prediabetes: Secondary | ICD-10-CM

## 2018-05-22 DIAGNOSIS — W57XXXA Bitten or stung by nonvenomous insect and other nonvenomous arthropods, initial encounter: Secondary | ICD-10-CM | POA: Diagnosis not present

## 2018-05-22 DIAGNOSIS — Z0184 Encounter for antibody response examination: Secondary | ICD-10-CM

## 2018-05-22 DIAGNOSIS — G4733 Obstructive sleep apnea (adult) (pediatric): Secondary | ICD-10-CM | POA: Insufficient documentation

## 2018-05-22 DIAGNOSIS — I10 Essential (primary) hypertension: Secondary | ICD-10-CM

## 2018-05-22 DIAGNOSIS — Z8659 Personal history of other mental and behavioral disorders: Secondary | ICD-10-CM

## 2018-05-22 DIAGNOSIS — F418 Other specified anxiety disorders: Secondary | ICD-10-CM

## 2018-05-22 DIAGNOSIS — E785 Hyperlipidemia, unspecified: Secondary | ICD-10-CM

## 2018-05-22 MED ORDER — AMLODIPINE BESYLATE 5 MG PO TABS: 5.0000 mg | ORAL_TABLET | Freq: Every day | ORAL | 3 refills | Status: DC

## 2018-05-22 MED ORDER — HYDROCORTISONE 2.5 % EX CREA: TOPICAL_CREAM | Freq: Two times a day (BID) | CUTANEOUS | 2 refills | Status: DC

## 2018-05-22 NOTE — Patient Instructions (Addendum)
sch RN visit hep B in 1 and 6 months, lab visit fasting 1 month, f/u in 3 months Increase norvasc to 5 mg daily take 2 of 2.5 mg daily until you run out   Hepatitis B Vaccine, Recombinant injection What is this medicine? HEPATITIS B VACCINE (hep uh TAHY tis B VAK seen) is a vaccine. It is used to prevent an infection with the hepatitis B virus. This medicine may be used for other purposes; ask your health care provider or pharmacist if you have questions. COMMON BRAND NAME(S): Engerix-B, Recombivax HB What should I tell my health care provider before I take this medicine? They need to know if you have any of these conditions: -fever, infection -heart disease -hepatitis B infection -immune system problems -kidney disease -an unusual or allergic reaction to vaccines, yeast, other medicines, foods, dyes, or preservatives -pregnant or trying to get pregnant -breast-feeding How should I use this medicine? This vaccine is for injection into a muscle. It is given by a health care professional. A copy of Vaccine Information Statements will be given before each vaccination. Read this sheet carefully each time. The sheet may change frequently. Talk to your pediatrician regarding the use of this medicine in children. While this drug may be prescribed for children as young as newborn for selected conditions, precautions do apply. Overdosage: If you think you have taken too much of this medicine contact a poison control center or emergency room at once. NOTE: This medicine is only for you. Do not share this medicine with others. What if I miss a dose? It is important not to miss your dose. Call your doctor or health care professional if you are unable to keep an appointment. What may interact with this medicine? -medicines that suppress your immune function like adalimumab, anakinra, infliximab -medicines to treat cancer -steroid medicines like prednisone or cortisone This list may not describe all  possible interactions. Give your health care provider a list of all the medicines, herbs, non-prescription drugs, or dietary supplements you use. Also tell them if you smoke, drink alcohol, or use illegal drugs. Some items may interact with your medicine. What should I watch for while using this medicine? See your health care provider for all shots of this vaccine as directed. You must have 3 shots of this vaccine for protection from hepatitis B infection. Tell your doctor right away if you have any serious or unusual side effects after getting this vaccine. What side effects may I notice from receiving this medicine? Side effects that you should report to your doctor or health care professional as soon as possible: -allergic reactions like skin rash, itching or hives, swelling of the face, lips, or tongue -breathing problems -confused, irritated -fast, irregular heartbeat -flu-like syndrome -numb, tingling pain -seizures -unusually weak or tired Side effects that usually do not require medical attention (report to your doctor or health care professional if they continue or are bothersome): -diarrhea -fever -headache -loss of appetite -muscle pain -nausea -pain, redness, swelling, or irritation at site where injected -tiredness This list may not describe all possible side effects. Call your doctor for medical advice about side effects. You may report side effects to FDA at 1-800-FDA-1088. Where should I keep my medicine? This drug is given in a hospital or clinic and will not be stored at home. NOTE: This sheet is a summary. It may not cover all possible information. If you have questions about this medicine, talk to your doctor, pharmacist, or health care provider.  2018 Elsevier/Gold Standard (2014-01-11 13:26:01)

## 2018-05-22 NOTE — Progress Notes (Addendum)
No chief complaint on file.  F/u  1. HTN still elevated norvasc 2.5 and hctz 25 mg qd  2. Agreeable to hep B vaccine  3. C/o red rash left abdomen lotriman helped more than clotrimazole and uses zeasorb af powder. C/o tick bite left back feeling well  4. Mild OSA does not want to use cpap  5. Anxiety/depression f/u Dr. Nicolasa Ducking inc zoloft to 150 mg qd just saw.    Review of Systems  Constitutional: Negative for weight loss.  HENT: Negative for hearing loss.   Eyes: Negative for blurred vision.  Respiratory: Negative for shortness of breath.   Cardiovascular: Negative for chest pain.  Gastrointestinal: Negative for abdominal pain.  Skin: Positive for rash.  Neurological: Negative for headaches.  Psychiatric/Behavioral: Positive for depression. The patient is nervous/anxious.    Past Medical History:  Diagnosis Date  . Breast cancer ([place]) 09/2012   chemo and radiation right  . Bunion   . Cancer ([place])    breast  . Depression   . Other vitamin B12 deficiency anemia   . Other vitamin B12 deficiency anemia   . Postmenopausal atrophic vaginitis    Past Surgical History:  Procedure Laterality Date  . ABDOMINAL HYSTERECTOMY    . BREAST BIOPSY Left 2002   stereotactic  . breast clip     left breast  . BREAST EXCISIONAL BIOPSY Right 2014   positive  . BUNIONECTOMY     bilateral  . GANGLION CYST EXCISION  01-09-10   Removed   . WISDOM TOOTH EXTRACTION     Family History  Problem Relation Age of Onset  . Heart disease Father   . Skin cancer Father   . Prostate cancer Father   . COPD Father   . Skin cancer Mother   . Heart disease Mother        afib  . Hyperlipidemia Mother   . Breast cancer Paternal Aunt 73  . Breast cancer Cousin 32  . Hyperlipidemia Sister   . Colon cancer Neg Hx   . Rectal cancer Neg Hx   . Stomach cancer Neg Hx    Social History   Socioeconomic History  . Marital status: Married    Spouse name: Not on file  . Number of children: Not on file  .  Years of education: Not on file  . Highest education level: Not on file  Occupational History  . Not on file  Social Needs  . Financial resource strain: Not on file  . Food insecurity:    Worry: Not on file    Inability: Not on file  . Transportation needs:    Medical: Not on file    Non-medical: Not on file  Tobacco Use  . Smoking status: Never Smoker  . Smokeless tobacco: Never Used  Substance and Sexual Activity  . Alcohol use: Yes    Comment: occasional glass of wine  . Drug use: No  . Sexual activity: Not on file  Lifestyle  . Physical activity:    Days per week: Not on file    Minutes per session: Not on file  . Stress: Not on file  Relationships  . Social connections:    Talks on phone: Not on file    Gets together: Not on file    Attends religious service: Not on file    Active member of club or organization: Not on file    Attends meetings of clubs or organizations: Not on file    Relationship status:  Not on file  . Intimate partner violence:    Fear of current or ex partner: Not on file    Emotionally abused: Not on file    Physically abused: Not on file    Forced sexual activity: Not on file  Other Topics Concern  . Not on file  Social History Narrative   Married    2 kids    [facility] to [facility]    No outpatient medications have been marked as taking for the 05/22/18 encounter (Appointment) with McLean-Scocuzza, Nino Glow, MD.   Allergies  Allergen Reactions  . Sulfa Antibiotics Itching    Rash under the skin  . Xanax [Alprazolam] Other (See Comments)    Night terrors   No results found for this or any previous visit (from the past 2160 hour(s)). Objective  There is no height or weight on file to calculate BMI. Wt Readings from Last 3 Encounters:  03/18/18 193 lb (87.5 kg)  02/10/18 188 lb 6.4 oz (85.5 kg)  07/31/17 189 lb (85.7 kg)   Temp Readings from Last 3 Encounters:  02/10/18 97.7 F (36.5 C) (Oral)  07/31/17 98.1 F (36.7  C) (Tympanic)  01/24/17 98.1 F (36.7 C) (Oral)   BP Readings from Last 3 Encounters:  03/18/18 120/82  02/10/18 112/82  07/31/17 (!) 142/94   Pulse Readings from Last 3 Encounters:  03/18/18 74  02/10/18 72  07/31/17 81    Physical Exam  Constitutional: She is oriented to person, place, and time. Vital signs are normal. She appears well-developed and well-nourished. She is cooperative.  HENT:  Head: Normocephalic and atraumatic.  Mouth/Throat: Oropharynx is clear and moist and mucous membranes are normal.  Eyes: Pupils are equal, round, and reactive to light. Conjunctivae are normal.  Cardiovascular: Normal rate, regular rhythm and normal heart sounds.  Pulmonary/Chest: Effort normal and breath sounds normal.  Neurological: She is alert and oriented to person, place, and time. Gait normal.  Skin: Skin is warm, dry and intact.  Psychiatric: She has a normal mood and affect. Her speech is normal and behavior is normal. Judgment and thought content normal. Cognition and memory are normal.  Nursing note and vitals reviewed.   Assessment   1. HTN/HLD 2. Dermatitis and tick bite left back  3. Mild OSA  4. Anxiety/depression  5.HM Plan  1. Cont meds Increase norvasc 5 hctz 25 and check labs in 1 month 2. hc 2.5 bid prn  3. Declines cpap  4. F/u psych Dr. Nicolasa Ducking saw 09/08/18 started Lamictal 25 mg qd x 2 weeks then increase to 50, zoloft 150 mg qd, wellbutrin 150 IR bid 8 am and 3 pm referred to [facility] cont cbt f/u in 3 weeks  5.  Had flu shot  Disc shingrix today  tdap had 05/13/12 Hep B 1/3 today  Check MMR  dexa 09/07/15 osteopenia  07/03/12 diverticulosis h/o hyperplastic polyp f/u in 10 years  S/p hysterectomy for DUB, fibroids pap neg 05/13/12  Dx mammo due 09/13/18 neg 2018 ordered h/o breast cancer H/o right breast cancer 5 years ago  Never smoker  Derm appt pending Dr. Amada Jupiter Dr. Nicolasa Ducking 08/13/18 added lamictal 25-50 mg qd for depression Provider:  Dr. Olivia Mackie McLean-Scocuzza-Internal Medicine

## 2018-06-16 ENCOUNTER — Ambulatory Visit: Payer: BLUE CROSS/BLUE SHIELD | Admitting: Internal Medicine

## 2018-06-20 ENCOUNTER — Encounter: Payer: Self-pay | Admitting: Internal Medicine

## 2018-06-26 ENCOUNTER — Other Ambulatory Visit: Payer: BLUE CROSS/BLUE SHIELD

## 2018-06-26 ENCOUNTER — Ambulatory Visit: Payer: BLUE CROSS/BLUE SHIELD

## 2018-07-10 ENCOUNTER — Ambulatory Visit (INDEPENDENT_AMBULATORY_CARE_PROVIDER_SITE_OTHER): Payer: BLUE CROSS/BLUE SHIELD | Admitting: *Deleted

## 2018-07-10 ENCOUNTER — Other Ambulatory Visit (INDEPENDENT_AMBULATORY_CARE_PROVIDER_SITE_OTHER): Payer: BLUE CROSS/BLUE SHIELD

## 2018-07-10 DIAGNOSIS — Z0184 Encounter for antibody response examination: Secondary | ICD-10-CM

## 2018-07-10 DIAGNOSIS — Z1159 Encounter for screening for other viral diseases: Secondary | ICD-10-CM | POA: Diagnosis not present

## 2018-07-10 DIAGNOSIS — Z23 Encounter for immunization: Secondary | ICD-10-CM | POA: Diagnosis not present

## 2018-07-10 DIAGNOSIS — I10 Essential (primary) hypertension: Secondary | ICD-10-CM | POA: Diagnosis not present

## 2018-07-10 DIAGNOSIS — E785 Hyperlipidemia, unspecified: Secondary | ICD-10-CM | POA: Diagnosis not present

## 2018-07-10 NOTE — Addendum Note (Signed)
Addended by: Arby Barrette on: 07/10/2018 08:22 AM   Modules accepted: Orders

## 2018-07-11 LAB — BASIC METABOLIC PANEL
BUN/Creatinine Ratio: 5 (calc) — ABNORMAL LOW (ref 6–22)
BUN: 4 mg/dL — ABNORMAL LOW (ref 7–25)
CHLORIDE: 103 mmol/L (ref 98–110)
CO2: 23 mmol/L (ref 20–32)
CREATININE: 0.76 mg/dL (ref 0.50–1.05)
Calcium: 9.4 mg/dL (ref 8.6–10.4)
Glucose, Bld: 102 mg/dL — ABNORMAL HIGH (ref 65–99)
POTASSIUM: 4.4 mmol/L (ref 3.5–5.3)
Sodium: 141 mmol/L (ref 135–146)

## 2018-07-11 LAB — MEASLES/MUMPS/RUBELLA IMMUNITY
MUMPS IGG: 182 [AU]/ml
Rubella: 1.93 index

## 2018-07-11 LAB — HEPATIC FUNCTION PANEL
AG RATIO: 2.2 (calc) (ref 1.0–2.5)
ALT: 21 U/L (ref 6–29)
AST: 16 U/L (ref 10–35)
Albumin: 4.4 g/dL (ref 3.6–5.1)
Alkaline phosphatase (APISO): 62 U/L (ref 33–130)
BILIRUBIN INDIRECT: 0.5 mg/dL (ref 0.2–1.2)
Bilirubin, Direct: 0.1 mg/dL (ref 0.0–0.2)
GLOBULIN: 2 g/dL (ref 1.9–3.7)
TOTAL PROTEIN: 6.4 g/dL (ref 6.1–8.1)
Total Bilirubin: 0.6 mg/dL (ref 0.2–1.2)

## 2018-07-11 LAB — LIPID PANEL
CHOL/HDL RATIO: 3.3 (calc) (ref <5.0)
CHOLESTEROL: 182 mg/dL (ref <200)
HDL: 55 mg/dL (ref >50)
LDL Cholesterol (Calc): 109 mg/dL (calc) — ABNORMAL HIGH
NON-HDL CHOLESTEROL (CALC): 127 mg/dL (ref <130)
TRIGLYCERIDES: 89 mg/dL (ref <150)

## 2018-07-22 ENCOUNTER — Other Ambulatory Visit: Payer: Self-pay | Admitting: Internal Medicine

## 2018-07-22 DIAGNOSIS — E785 Hyperlipidemia, unspecified: Secondary | ICD-10-CM

## 2018-07-22 MED ORDER — ATORVASTATIN CALCIUM 20 MG PO TABS: 20.0000 mg | ORAL_TABLET | Freq: Every day | ORAL | 1 refills | Status: DC

## 2018-07-30 ENCOUNTER — Other Ambulatory Visit: Payer: BLUE CROSS/BLUE SHIELD

## 2018-07-30 ENCOUNTER — Ambulatory Visit: Payer: BLUE CROSS/BLUE SHIELD | Admitting: Internal Medicine

## 2018-08-06 ENCOUNTER — Inpatient Hospital Stay: Payer: BLUE CROSS/BLUE SHIELD | Admitting: Internal Medicine

## 2018-08-06 ENCOUNTER — Inpatient Hospital Stay: Payer: BLUE CROSS/BLUE SHIELD

## 2018-08-13 DIAGNOSIS — F411 Generalized anxiety disorder: Secondary | ICD-10-CM | POA: Diagnosis not present

## 2018-08-13 DIAGNOSIS — F39 Unspecified mood [affective] disorder: Secondary | ICD-10-CM | POA: Diagnosis not present

## 2018-08-28 ENCOUNTER — Inpatient Hospital Stay (HOSPITAL_BASED_OUTPATIENT_CLINIC_OR_DEPARTMENT_OTHER): Payer: BLUE CROSS/BLUE SHIELD | Admitting: Internal Medicine

## 2018-08-28 ENCOUNTER — Ambulatory Visit: Payer: BLUE CROSS/BLUE SHIELD | Admitting: Internal Medicine

## 2018-08-28 ENCOUNTER — Encounter: Payer: Self-pay | Admitting: Internal Medicine

## 2018-08-28 ENCOUNTER — Inpatient Hospital Stay: Payer: BLUE CROSS/BLUE SHIELD | Attending: Internal Medicine

## 2018-08-28 ENCOUNTER — Telehealth: Payer: Self-pay | Admitting: Internal Medicine

## 2018-08-28 VITALS — BP 147/94 | HR 73 | Resp 16 | Wt 165.8 lb

## 2018-08-28 DIAGNOSIS — Z170 Estrogen receptor positive status [ER+]: Secondary | ICD-10-CM | POA: Diagnosis not present

## 2018-08-28 DIAGNOSIS — E876 Hypokalemia: Secondary | ICD-10-CM | POA: Diagnosis not present

## 2018-08-28 DIAGNOSIS — F329 Major depressive disorder, single episode, unspecified: Secondary | ICD-10-CM | POA: Insufficient documentation

## 2018-08-28 DIAGNOSIS — Z79899 Other long term (current) drug therapy: Secondary | ICD-10-CM

## 2018-08-28 DIAGNOSIS — I10 Essential (primary) hypertension: Secondary | ICD-10-CM | POA: Diagnosis not present

## 2018-08-28 DIAGNOSIS — M8580 Other specified disorders of bone density and structure, unspecified site: Secondary | ICD-10-CM | POA: Insufficient documentation

## 2018-08-28 DIAGNOSIS — C50811 Malignant neoplasm of overlapping sites of right female breast: Secondary | ICD-10-CM

## 2018-08-28 DIAGNOSIS — Z79811 Long term (current) use of aromatase inhibitors: Secondary | ICD-10-CM

## 2018-08-28 LAB — COMPREHENSIVE METABOLIC PANEL
ALBUMIN: 5 g/dL (ref 3.5–5.0)
ALT: 19 U/L (ref 0–44)
ANION GAP: 12 (ref 5–15)
AST: 20 U/L (ref 15–41)
Alkaline Phosphatase: 59 U/L (ref 38–126)
BILIRUBIN TOTAL: 0.9 mg/dL (ref 0.3–1.2)
BUN: 12 mg/dL (ref 6–20)
CALCIUM: 9.3 mg/dL (ref 8.9–10.3)
CO2: 27 mmol/L (ref 22–32)
Chloride: 92 mmol/L — ABNORMAL LOW (ref 98–111)
Creatinine, Ser: 0.76 mg/dL (ref 0.44–1.00)
GFR calc non Af Amer: >60 mL/min (ref >60)
GLUCOSE: 92 mg/dL (ref 70–99)
POTASSIUM: 3.2 mmol/L — AB (ref 3.5–5.1)
Sodium: 131 mmol/L — ABNORMAL LOW (ref 135–145)
TOTAL PROTEIN: 7.6 g/dL (ref 6.5–8.1)

## 2018-08-28 LAB — CBC WITH DIFFERENTIAL/PLATELET
Abs Immature Granulocytes: 0.02 10*3/uL (ref 0.00–0.07)
BASOS ABS: 0 10*3/uL (ref 0.0–0.1)
BASOS PCT: 1 %
EOS ABS: 0.1 10*3/uL (ref 0.0–0.5)
Eosinophils Relative: 2 %
HCT: 38.4 % (ref 36.0–46.0)
Hemoglobin: 13.3 g/dL (ref 12.0–15.0)
IMMATURE GRANULOCYTES: 1 %
Lymphocytes Relative: 27 %
Lymphs Abs: 1.1 10*3/uL (ref 0.7–4.0)
MCH: 31.1 pg (ref 26.0–34.0)
MCHC: 34.6 g/dL (ref 30.0–36.0)
MCV: 89.7 fL (ref 80.0–100.0)
MONOS PCT: 9 %
Monocytes Absolute: 0.4 10*3/uL (ref 0.1–1.0)
NEUTROS PCT: 60 %
NRBC: 0 % (ref 0.0–0.2)
Neutro Abs: 2.5 10*3/uL (ref 1.7–7.7)
PLATELETS: 222 10*3/uL (ref 150–400)
RBC: 4.28 MIL/uL (ref 3.87–5.11)
RDW: 12.8 % (ref 11.5–15.5)
WBC: 4.1 10*3/uL (ref 4.0–10.5)

## 2018-08-28 NOTE — Progress Notes (Signed)
[place] OFFICE PROGRESS NOTE  Patient Care Team: McLean-Scocuzza, Nino Glow, MD as PCP - General (Internal Medicine)   SUMMARY OF HEMATOLOGIC/ONCOLOGIC HISTORY:  Oncology History   # FEB 2014- BREAST CANCER STAGE II [T1N1; 1.3cm 1/12LN pos; extranodal extension; Dr.Smith ]ER/PR-POS; Her 2 NEU-NEG. ; dd AC- Taxol weeklyx12 S/p RT; NOV 2014- Start Femara; Stop Dec 2016; MAY 2017- START Aromasin; STOPPED in [place] 2016 [joint pains]  # Chronic depression [Dr.Kapur]  # my RISK- NEG for deleterious mutation; VUS- BRIP1 gene     Breast cancer, right breast ([place])   01/12/2013 Initial Diagnosis    Breast cancer, right breast ([place])     Carcinoma of overlapping sites of right breast in female, estrogen receptor positive [facility])    Radiation Therapy     The patient saw No care team member to display for radiation treatment. This is the current list of radiation treatment: Radiation Treatments    Patient's record has no active or historical radiation treatments documented.          INTERVAL HISTORY:  A very pleasant 57 year old female patient with above history of stage II breast cancer 2014 currently is here for follow-up. Patient on adjuvant Femara- however she stopped taking dec 2016  because of multiple side effects Is here for follow-up.  Patient continues to have chronic joint pains.  Mild to moderate fatigue.  No new shortness of breath or cough.  Patient states that her depression is not well controlled.  She is currently working with Dr. Jake Michaelis.  Review of Systems  Constitutional: Negative for chills, diaphoresis, fever, malaise/fatigue and weight loss.  HENT: Negative for nosebleeds and sore throat.   Eyes: Negative for double vision.  Respiratory: Negative for cough, hemoptysis, sputum production, shortness of breath and wheezing.   Cardiovascular: Negative for chest pain, palpitations, orthopnea and leg swelling.  Gastrointestinal: Negative for abdominal  pain, blood in stool, constipation, diarrhea, heartburn, melena, nausea and vomiting.  Genitourinary: Negative for dysuria, frequency and urgency.  Musculoskeletal: Positive for back pain and joint pain.  Skin: Negative.  Negative for itching and rash.  Neurological: Negative for dizziness, tingling, focal weakness, weakness and headaches.  Endo/Heme/Allergies: Does not bruise/bleed easily.  Psychiatric/Behavioral: Positive for depression. The patient is nervous/anxious. The patient does not have insomnia.      PAST MEDICAL HISTORY :  Past Medical History:  Diagnosis Date  . Breast cancer ([place]) 09/2012   chemo and radiation right  . Bunion   . Cancer ([place])    breast  . Depression   . Hyperlipidemia   . Hypertension   . Other vitamin B12 deficiency anemia   . Other vitamin B12 deficiency anemia   . Postmenopausal atrophic vaginitis     PAST SURGICAL HISTORY :   Past Surgical History:  Procedure Laterality Date  . ABDOMINAL HYSTERECTOMY    . BREAST BIOPSY Left 2002   stereotactic  . breast clip     left breast  . BREAST EXCISIONAL BIOPSY Right 2014   positive  . BUNIONECTOMY     bilateral  . GANGLION CYST EXCISION  01-09-10   Removed   . WISDOM TOOTH EXTRACTION      FAMILY HISTORY :   Family History  Problem Relation Age of Onset  . Heart disease Father   . Skin cancer Father   . Prostate cancer Father   . COPD Father   . Skin cancer Mother   . Heart disease Mother  afib  . Hyperlipidemia Mother   . Breast cancer Paternal Aunt 36  . Breast cancer Cousin 66  . Hyperlipidemia Sister   . Colon cancer Neg Hx   . Rectal cancer Neg Hx   . Stomach cancer Neg Hx     SOCIAL HISTORY:   Social History   Tobacco Use  . Smoking status: Never Smoker  . Smokeless tobacco: Never Used  Substance Use Topics  . Alcohol use: Yes    Comment: occasional glass of wine  . Drug use: No    ALLERGIES:  is allergic to sulfa antibiotics and xanax  [alprazolam].  MEDICATIONS:  Current Outpatient Medications  Medication Sig Dispense Refill  . amLODipine (NORVASC) 5 MG tablet Take 1 tablet (5 mg total) by mouth daily. 90 tablet 3  . atorvastatin (LIPITOR) 20 MG tablet Take 1 tablet (20 mg total) by mouth daily at 6 PM. 90 tablet 1  . buPROPion (WELLBUTRIN) 75 MG tablet Take 150 mg by mouth 2 (two) times daily.     . calcium-vitamin D (OSCAL WITH D) 500-200 MG-UNIT tablet Take 1 tablet by mouth.    . cholecalciferol (VITAMIN D) 400 units TABS tablet Take 400 Units by mouth.    . clonazePAM (KLONOPIN) 0.5 MG tablet     . fluticasone (FLONASE) 50 MCG/ACT nasal spray Place 2 sprays into both nostrils daily. 16 g 10  . hydrochlorothiazide (HYDRODIURIL) 25 MG tablet Take 1 tablet (25 mg total) by mouth daily. 90 tablet 3  . hydrocortisone 2.5 % cream Apply topically 2 (two) times daily. 30 g 2  . lamoTRIgine (LAMICTAL) 25 MG tablet Take 25-50 mg by mouth daily.    . meloxicam (MOBIC) 7.5 MG tablet Take 1 tablet (7.5 mg total) by mouth daily. 30 tablet 0  . sertraline (ZOLOFT) 100 MG tablet Take 150 mg by mouth daily.     No current facility-administered medications for this visit.     PHYSICAL EXAMINATION: ECOG PERFORMANCE STATUS: 0 - Asymptomatic  BP (!) 147/94 (BP Location: Left Arm, Patient Position: Sitting)   Pulse 73   Resp 16   Wt 165 lb 12.8 oz (75.2 kg)   BMI 26.76 kg/m   Filed Weights   08/28/18 1132  Weight: 165 lb 12.8 oz (75.2 kg)    Physical Exam  Constitutional: She is oriented to person, place, and time and well-developed, well-nourished, and in no distress.  HENT:  Head: Normocephalic and atraumatic.  Mouth/Throat: Oropharynx is clear and moist. No oropharyngeal exudate.  Eyes: Pupils are equal, round, and reactive to light.  Neck: Normal range of motion. Neck supple.  Cardiovascular: Normal rate and regular rhythm.  Pulmonary/Chest: No respiratory distress. She has no wheezes.  Abdominal: Soft. Bowel  sounds are normal. She exhibits no distension and no mass. There is no tenderness. There is no rebound and no guarding.  Musculoskeletal: Normal range of motion. She exhibits no edema or tenderness.  Neurological: She is alert and oriented to person, place, and time.  Skin: Skin is warm.  Right and left BREAST exam (in the presence of nurse)- no unusual skin changes or dominant masses felt. Surgical scars noted.    Psychiatric: Affect normal.     LABORATORY DATA:  I have reviewed the data as listed    Component Value Date/Time   NA 131 (L) 08/28/2018 1044   NA 136 08/30/2014 1116   K 3.2 (L) 08/28/2018 1044   K 3.9 08/30/2014 1116   CL 92 (L) 08/28/2018 1044  CL 98 08/30/2014 1116   CO2 27 08/28/2018 1044   CO2 29 08/30/2014 1116   GLUCOSE 92 08/28/2018 1044   GLUCOSE 115 (H) 08/30/2014 1116   BUN 12 08/28/2018 1044   BUN 13 08/30/2014 1116   CREATININE 0.76 08/28/2018 1044   CREATININE 0.76 07/10/2018 0822   CALCIUM 9.3 08/28/2018 1044   CALCIUM 9.9 08/30/2014 1116   PROT 7.6 08/28/2018 1044   PROT 7.1 08/30/2014 1116   ALBUMIN 5.0 08/28/2018 1044   ALBUMIN 4.2 08/30/2014 1116   AST 20 08/28/2018 1044   AST 21 08/30/2014 1116   ALT 19 08/28/2018 1044   ALT 31 08/30/2014 1116   ALKPHOS 59 08/28/2018 1044   ALKPHOS 81 08/30/2014 1116   BILITOT 0.9 08/28/2018 1044   BILITOT 0.6 08/30/2014 1116   GFRNONAA >60 08/28/2018 1044   GFRNONAA >60 08/30/2014 1116   GFRNONAA >60 11/11/2013 1443   GFRAA >60 08/28/2018 1044   GFRAA >60 08/30/2014 1116   GFRAA >60 11/11/2013 1443    No results found for: SPEP, UPEP  Lab Results  Component Value Date   WBC 4.1 08/28/2018   NEUTROABS 2.5 08/28/2018   HGB 13.3 08/28/2018   HCT 38.4 08/28/2018   MCV 89.7 08/28/2018   PLT 222 08/28/2018      Chemistry      Component Value Date/Time   NA 131 (L) 08/28/2018 1044   NA 136 08/30/2014 1116   K 3.2 (L) 08/28/2018 1044   K 3.9 08/30/2014 1116   CL 92 (L) 08/28/2018 1044    CL 98 08/30/2014 1116   CO2 27 08/28/2018 1044   CO2 29 08/30/2014 1116   BUN 12 08/28/2018 1044   BUN 13 08/30/2014 1116   CREATININE 0.76 08/28/2018 1044   CREATININE 0.76 07/10/2018 0822      Component Value Date/Time   CALCIUM 9.3 08/28/2018 1044   CALCIUM 9.9 08/30/2014 1116   ALKPHOS 59 08/28/2018 1044   ALKPHOS 81 08/30/2014 1116   AST 20 08/28/2018 1044   AST 21 08/30/2014 1116   ALT 19 08/28/2018 1044   ALT 31 08/30/2014 1116   BILITOT 0.9 08/28/2018 1044   BILITOT 0.6 08/30/2014 1116        ASSESSMENT & PLAN:   Carcinoma of overlapping sites of right breast in female, estrogen receptor positive ([place]) # Stage II breast cancer ER/PR positive HER-2/neu negative-currently off arimidex since dec 2016 [sec to SEs].  Stable.  #Clinically no evidence of recurrence.  Await mammogram December 2019.  # chronic joint pains stable.  Continue vitamin D calcium and/chondroitin.  Vitamin D glucosamine chondroitin.   # Depression/bipolar- not well controlled as per patient;zoloft/wellbutrin; ? Lamictal- not well controlled-  Following up with Dr. Nicolasa Ducking.  # hypokalemia-worse.  3.2; recommend dietary supplementation.  # OSTEOPENIA- DEC 2016- BMD -2.3;  On ca+vit D.  Reviewed the bone density.  Recommend a bone density along with a mammogram end of the December 2019.  Discussed regarding exercise; also other treatment options available.  # DISPOSITION: # order BMD on 12/23 with mammo [is ordered/scheduled] # Follow-up with me in 12 months labs-cbc/cmp/ca-27-29  Few days prior-Dr.B      Cammie Sickle, MD 08/28/2018 5:06 PM

## 2018-08-28 NOTE — Telephone Encounter (Signed)
x

## 2018-08-28 NOTE — Assessment & Plan Note (Addendum)
#  Stage II breast cancer ER/PR positive HER-2/neu negative-currently off arimidex since dec 2016 [sec to SEs].  Stable.  #Clinically no evidence of recurrence.  Await mammogram December 2019.  # chronic joint pains stable.  Continue vitamin D calcium and/chondroitin.  Vitamin D glucosamine chondroitin.   # Depression/bipolar- not well controlled as per patient;zoloft/wellbutrin; ? Lamictal- not well controlled-  Following up with Dr. Nicolasa Ducking.  # hypokalemia-worse.  3.2; recommend dietary supplementation.  # OSTEOPENIA- DEC 2016- BMD -2.3;  On ca+vit D.  Reviewed the bone density.  Recommend a bone density along with a mammogram end of the December 2019.  Discussed regarding exercise; also other treatment options available.  # DISPOSITION: # order BMD on 12/23 with mammo [is ordered/scheduled] # Follow-up with me in 12 months labs-cbc/cmp/ca-27-29  Few days prior-Dr.B

## 2018-08-29 ENCOUNTER — Telehealth: Payer: Self-pay | Admitting: Internal Medicine

## 2018-08-29 LAB — CANCER ANTIGEN 27.29: CAN 27.29: 13.9 U/mL (ref 0.0–38.6)

## 2018-08-29 NOTE — Telephone Encounter (Signed)
Message sent

## 2018-09-05 DIAGNOSIS — F331 Major depressive disorder, recurrent, moderate: Secondary | ICD-10-CM | POA: Diagnosis not present

## 2018-09-08 DIAGNOSIS — F39 Unspecified mood [affective] disorder: Secondary | ICD-10-CM | POA: Diagnosis not present

## 2018-09-08 DIAGNOSIS — F411 Generalized anxiety disorder: Secondary | ICD-10-CM | POA: Diagnosis not present

## 2018-09-15 ENCOUNTER — Ambulatory Visit
Admission: RE | Admit: 2018-09-15 | Discharge: 2018-09-15 | Disposition: A | Discharge status: Home / Self Care | Payer: BLUE CROSS/BLUE SHIELD | Source: Ambulatory Visit | Attending: Internal Medicine | Admitting: Internal Medicine

## 2018-09-15 DIAGNOSIS — R922 Inconclusive mammogram: Secondary | ICD-10-CM | POA: Diagnosis not present

## 2018-09-15 DIAGNOSIS — Z853 Personal history of malignant neoplasm of breast: Secondary | ICD-10-CM | POA: Insufficient documentation

## 2018-09-15 HISTORY — DX: Personal history of irradiation: Z92.3

## 2018-09-15 HISTORY — DX: Personal history of antineoplastic chemotherapy: Z92.21

## 2018-09-18 ENCOUNTER — Encounter: Payer: Self-pay | Admitting: Internal Medicine

## 2018-09-18 ENCOUNTER — Ambulatory Visit: Payer: BLUE CROSS/BLUE SHIELD | Admitting: Internal Medicine

## 2018-09-18 VITALS — BP 148/98 | HR 80 | Temp 97.1°F | Ht 65.0 in | Wt 161.8 lb

## 2018-09-18 DIAGNOSIS — M545 Low back pain: Secondary | ICD-10-CM | POA: Diagnosis not present

## 2018-09-18 DIAGNOSIS — I10 Essential (primary) hypertension: Secondary | ICD-10-CM | POA: Diagnosis not present

## 2018-09-18 DIAGNOSIS — J329 Chronic sinusitis, unspecified: Secondary | ICD-10-CM

## 2018-09-18 DIAGNOSIS — F418 Other specified anxiety disorders: Secondary | ICD-10-CM

## 2018-09-18 DIAGNOSIS — E876 Hypokalemia: Secondary | ICD-10-CM

## 2018-09-18 DIAGNOSIS — G8929 Other chronic pain: Secondary | ICD-10-CM

## 2018-09-18 DIAGNOSIS — E663 Overweight: Secondary | ICD-10-CM

## 2018-09-18 MED ORDER — LAMOTRIGINE 100 MG PO TABS: 100.0000 mg | ORAL_TABLET | Freq: Every day | ORAL | Status: DC

## 2018-09-18 MED ORDER — CHLORTHALIDONE 25 MG PO TABS: 25.0000 mg | ORAL_TABLET | Freq: Every day | ORAL | 3 refills | Status: DC

## 2018-09-18 MED ORDER — TRIAMTERENE-HCTZ 37.5-25 MG PO TABS: 1.0000 | ORAL_TABLET | Freq: Every day | ORAL | 3 refills | Status: DC

## 2018-09-18 MED ORDER — MELOXICAM 7.5 MG PO TABS: 7.5000 mg | ORAL_TABLET | Freq: Every day | ORAL | 0 refills | Status: DC | PRN

## 2018-09-18 MED ORDER — AZITHROMYCIN 250 MG PO TABS: ORAL_TABLET | ORAL | 0 refills | Status: DC

## 2018-09-18 NOTE — Patient Instructions (Addendum)
Mucinex DM green label or Robitussin DM  Cough drops  Warm tea with honey and lemon    Hydrochlorothiazide, HCTZ; Triamterene tablets or capsules What is this medicine? HYDROCHLOROTHIAZIDE; TRIAMTERENE (hye droe klor oh THYE a zide; trye AM ter een) is a diuretic. It helps you make more urine and lose the extra water from your body. This medicine is used to treat high blood pressure and edema or swelling from excess water. This medicine may be used for other purposes; ask your health care provider or pharmacist if you have questions. COMMON BRAND NAME(S): Dyazide, Maxzide What should I tell my health care provider before I take this medicine? They need to know if you have any of these conditions: -diabetes -immune system problems, like lupus -kidney disease or stones -liver disease -small amount of urine or difficulty passing urine -an unusual or allergic reaction to triamterene, hydrochlorothiazide, sulfa drugs, other medicines, foods, dyes, or preservatives -pregnant or trying to get pregnant -breast-feeding How should I use this medicine? Take this medicine by mouth with a glass of water. Follow the directions on your prescription label. Take your medicine at regular intervals. Do not take it more often than directed. Do not stop taking except on your doctor's advice. Remember that you will need to pass urine frequently after taking this medicine. Do not take your doses at a time of day that will cause you problems. Do not take at bedtime. Talk to your pediatrician regarding the use of this medicine in children. Special care may be needed. Overdosage: If you think you have taken too much of this medicine contact a poison control center or emergency room at once. NOTE: This medicine is only for you. Do not share this medicine with others. What if I miss a dose? If you miss a dose, take it as soon as you can. If it is almost time for your next dose, take only that dose. Do not take double  or extra doses. What may interact with this medicine? Do not take this medicine with any of the following medications: -cidofovir -dofetilide -eplerenone -potassium supplements -tranylcypromine This medicine may also interact with the following medications: -certain medicines for blood pressure, heart disease like benazepril, lisinopril, losartan, valsartan -lithium -medicines for diabetes -medicines that relax muscles for surgery -NSAIDs, medicines for pain and inflammation, like ibuprofen or naproxen -other diuretics -penicillin G potassium This list may not describe all possible interactions. Give your health care provider a list of all the medicines, herbs, non-prescription drugs, or dietary supplements you use. Also tell them if you smoke, drink alcohol, or use illegal drugs. Some items may interact with your medicine. What should I watch for while using this medicine? Visit your doctor or health care professional for regular check ups. You will need lab work done before you start this medicine and regularly while you are taking it. Check your blood pressure regularly. Ask your health care professional what your blood pressure should be, and when you should contact them. If you are a diabetic, check your blood sugar as directed. Do not stop taking your medicine unless your doctor tells you to. You may need to be on a special diet while taking this medicine. Ask your doctor. Also, ask how many glasses of fluid you need to drink a day. You must not get dehydrated. You may get drowsy or dizzy. Do not drive, use machinery, or do anything that needs mental alertness until you know how this medicine affects you. Do not stand or  sit up quickly, especially if you are an older patient. This reduces the risk of dizzy or fainting spells. Alcohol may interfere with the effect of this medicine. Avoid or limit alcoholic drinks. This medicine can make you more sensitive to the sun. Keep out of the sun. If  you cannot avoid being in the sun, wear protective clothing and use sunscreen. Do not use sun lamps or tanning beds/booths. What side effects may I notice from receiving this medicine? Side effects that you should report to your doctor or health care professional as soon as possible: -allergic reactions such as skin rash or itching, hives, swelling of the lips, mouth, tongue, or throat -changes in vision -eye pain -fast or irregular heartbeat, chest pain -feeling faint or dizzy -gout attack -muscle pain or cramps -numbness or tingling in hands, feet, or lips -pain or difficulty when passing urine -redness, blistering, peeling or loosening of the skin, including inside the mouth -shortness of breath -unusually weak or tired Side effects that usually do not require medical attention (report to your doctor or health care professional if they continue or are bothersome): -change in sex drive or performance -dry mouth -headache -stomach upset This list may not describe all possible side effects. Call your doctor for medical advice about side effects. You may report side effects to FDA at 1-800-FDA-1088. Where should I keep my medicine? Keep out of the reach of children. Store at room temperature between 15 and 30 degrees C (59 and 86 degrees F). Protect from light. Throw away any unused medicine after the expiration date. NOTE: This sheet is a summary. It may not cover all possible information. If you have questions about this medicine, talk to your doctor, pharmacist, or health care provider.  2019 Elsevier/Gold Standard (2017-01-24 10:08:07)   Sinusitis, Adult Sinusitis is inflammation of your sinuses. Sinuses are hollow spaces in the bones around your face. Your sinuses are located:  Around your eyes.  In the middle of your forehead.  Behind your nose.  In your cheekbones. Mucus normally drains out of your sinuses. When your nasal tissues become inflamed or swollen, mucus can  become trapped or blocked. This allows bacteria, viruses, and fungi to grow, which leads to infection. Most infections of the sinuses are caused by a virus. Sinusitis can develop quickly. It can last for up to 4 weeks (acute) or for more than 12 weeks (chronic). Sinusitis often develops after a cold. What are the causes? This condition is caused by anything that creates swelling in the sinuses or stops mucus from draining. This includes:  Allergies.  Asthma.  Infection from bacteria or viruses.  Deformities or blockages in your nose or sinuses.  Abnormal growths in the nose (nasal polyps).  Pollutants, such as chemicals or irritants in the air.  Infection from fungi (rare). What increases the risk? You are more likely to develop this condition if you:  Have a weak body defense system (immune system).  Do a lot of swimming or diving.  Overuse nasal sprays.  Smoke. What are the signs or symptoms? The main symptoms of this condition are pain and a feeling of pressure around the affected sinuses. Other symptoms include:  Stuffy nose or congestion.  Thick drainage from your nose.  Swelling and warmth over the affected sinuses.  Headache.  Upper toothache.  A cough that may get worse at night.  Extra mucus that collects in the throat or the back of the nose (postnasal drip).  Decreased sense of smell  and taste.  Fatigue.  A fever.  Sore throat.  Bad breath. How is this diagnosed? This condition is diagnosed based on:  Your symptoms.  Your medical history.  A physical exam.  Tests to find out if your condition is acute or chronic. This may include: ? Checking your nose for nasal polyps. ? Viewing your sinuses using a device that has a light (endoscope). ? Testing for allergies or bacteria. ? Imaging tests, such as an MRI or CT scan. In rare cases, a bone biopsy may be done to rule out more serious types of fungal sinus disease. How is this  treated? Treatment for sinusitis depends on the cause and whether your condition is chronic or acute.  If caused by a virus, your symptoms should go away on their own within 10 days. You may be given medicines to relieve symptoms. They include: ? Medicines that shrink swollen nasal passages (topical intranasal decongestants). ? Medicines that treat allergies (antihistamines). ? A spray that eases inflammation of the nostrils (topical intranasal corticosteroids). ? Rinses that help get rid of thick mucus in your nose (nasal saline washes).  If caused by bacteria, your health care provider may recommend waiting to see if your symptoms improve. Most bacterial infections will get better without antibiotic medicine. You may be given antibiotics if you have: ? A severe infection. ? A weak immune system.  If caused by narrow nasal passages or nasal polyps, you may need to have surgery. Follow these instructions at home: Medicines  Take, use, or apply over-the-counter and prescription medicines only as told by your health care provider. These may include nasal sprays.  If you were prescribed an antibiotic medicine, take it as told by your health care provider. Do not stop taking the antibiotic even if you start to feel better. Hydrate and humidify   Drink enough fluid to keep your urine pale yellow. Staying hydrated will help to thin your mucus.  Use a cool mist humidifier to keep the humidity level in your home above 50%.  Inhale steam for 10-15 minutes, 3-4 times a day, or as told by your health care provider. You can do this in the bathroom while a hot shower is running.  Limit your exposure to cool or dry air. Rest  Rest as much as possible.  Sleep with your head raised (elevated).  Make sure you get enough sleep each night. General instructions   Apply a warm, moist washcloth to your face 3-4 times a day or as told by your health care provider. This will help with  discomfort.  Wash your hands often with soap and water to reduce your exposure to germs. If soap and water are not available, use hand sanitizer.  Do not smoke. Avoid being around people who are smoking (secondhand smoke).  Keep all follow-up visits as told by your health care provider. This is important. Contact a health care provider if:  You have a fever.  Your symptoms get worse.  Your symptoms do not improve within 10 days. Get help right away if:  You have a severe headache.  You have persistent vomiting.  You have severe pain or swelling around your face or eyes.  You have vision problems.  You develop confusion.  Your neck is stiff.  You have trouble breathing. Summary  Sinusitis is soreness and inflammation of your sinuses. Sinuses are hollow spaces in the bones around your face.  This condition is caused by nasal tissues that become inflamed or swollen.  The swelling traps or blocks the flow of mucus. This allows bacteria, viruses, and fungi to grow, which leads to infection.  If you were prescribed an antibiotic medicine, take it as told by your health care provider. Do not stop taking the antibiotic even if you start to feel better.  Keep all follow-up visits as told by your health care provider. This is important. This information is not intended to replace advice given to you by your health care provider. Make sure you discuss any questions you have with your health care provider. Document Released: 09/10/2005 Document Revised: 02/10/2018 Document Reviewed: 02/10/2018 Elsevier Interactive Patient Education  2019 [employer].

## 2018-09-18 NOTE — Progress Notes (Signed)
Chief Complaint  Patient presents with  . Follow-up   F/u  1. HTN uncontrolled on hctz 25 mg qd and norvasc 5 mg qd though she is losing wt and has lost 35#  2. hypoK likely 2/2 diuretic  3. C/o sinus pressure frontal and right lower neck pain and sore throat x 2 weeks and PND tried zyrtec, benadryl has flonase not tried she did have fever 1 night but did not check it  4. Overweight eating right and exercise walking more  5. Mood on lamictal 100 mg qd now Dr. Nicolasa Ducking   Review of Systems  Constitutional: Negative for fever.  HENT: Positive for sore throat.   Eyes: Negative for blurred vision.  Respiratory: Positive for cough.   Cardiovascular: Negative for chest pain.  Gastrointestinal: Negative for abdominal pain.  Skin: Negative for rash.  Neurological: Negative for headaches.  Psychiatric/Behavioral: Negative for depression.   Past Medical History:  Diagnosis Date  . Breast cancer ([place]) 09/2012   chemo and radiation right  . Bunion   . Cancer ([place])    breast  . Depression   . Hyperlipidemia   . Hypertension   . Other vitamin B12 deficiency anemia   . Other vitamin B12 deficiency anemia   . Personal history of chemotherapy   . Personal history of radiation therapy   . Postmenopausal atrophic vaginitis    Past Surgical History:  Procedure Laterality Date  . ABDOMINAL HYSTERECTOMY    . BREAST BIOPSY Left 2002   stereotactic  . breast clip     left breast  . BREAST EXCISIONAL BIOPSY Right 2014   positive  . BUNIONECTOMY     bilateral  . GANGLION CYST EXCISION  01-09-10   Removed   . WISDOM TOOTH EXTRACTION     Family History  Problem Relation Age of Onset  . Heart disease Father   . Skin cancer Father   . Prostate cancer Father   . COPD Father   . Skin cancer Mother   . Heart disease Mother        afib  . Hyperlipidemia Mother   . Breast cancer Paternal Aunt 22  . Breast cancer Cousin 20  . Hyperlipidemia Sister   . Colon cancer Neg Hx   . Rectal cancer  Neg Hx   . Stomach cancer Neg Hx    Social History   Socioeconomic History  . Marital status: Married    Spouse name: Not on file  . Number of children: Not on file  . Years of education: Not on file  . Highest education level: Not on file  Occupational History  . Not on file  Social Needs  . Financial resource strain: Not on file  . Food insecurity:    Worry: Not on file    Inability: Not on file  . Transportation needs:    Medical: Not on file    Non-medical: Not on file  Tobacco Use  . Smoking status: Never Smoker  . Smokeless tobacco: Never Used  Substance and Sexual Activity  . Alcohol use: Yes    Comment: occasional glass of wine  . Drug use: No  . Sexual activity: Not on file  Lifestyle  . Physical activity:    Days per week: Not on file    Minutes per session: Not on file  . Stress: Not on file  Relationships  . Social connections:    Talks on phone: Not on file    Gets together: Not on file  Attends religious service: Not on file    Active member of club or organization: Not on file    Attends meetings of clubs or organizations: Not on file    Relationship status: Not on file  . Intimate partner violence:    Fear of current or ex partner: Not on file    Emotionally abused: Not on file    Physically abused: Not on file    Forced sexual activity: Not on file  Other Topics Concern  . 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 amLODipine (NORVASC) 5 MG tablet Take 1 tablet (5 mg total) by mouth daily.  Marland Kitchen atorvastatin (LIPITOR) 20 MG tablet Take 1 tablet (20 mg total) by mouth daily at 6 PM.  . buPROPion (WELLBUTRIN) 75 MG tablet Take 150 mg by mouth 2 (two) times daily.   . calcium-vitamin D (OSCAL WITH D) 500-200 MG-UNIT tablet Take 1 tablet by mouth.  . cholecalciferol (VITAMIN D) 400 units TABS tablet Take 400 Units by mouth.  . clonazePAM (KLONOPIN) 0.5 MG  tablet   . hydrochlorothiazide (HYDRODIURIL) 25 MG tablet Take 1 tablet (25 mg total) by mouth daily.  . hydrocortisone 2.5 % cream Apply topically 2 (two) times daily.  Marland Kitchen lamoTRIgine (LAMICTAL) 25 MG tablet Take 100 mg by mouth daily.   . meloxicam (MOBIC) 7.5 MG tablet Take 1 tablet (7.5 mg total) by mouth daily.  . sertraline (ZOLOFT) 100 MG tablet Take 150 mg by mouth daily.   Allergies  Allergen Reactions  . Sulfa Antibiotics Itching    Rash under the skin  . Xanax [Alprazolam] Other (See Comments)    Night terrors   Recent Results (from the past 2160 hour(s))  Measles/Mumps/Rubella Immunity     Status: None   Collection Time: 07/10/18  8:22 AM  Result Value Ref Range   Rubeola IgG >300.00 AU/mL    Comment: AU/mL            Interpretation -----            -------------- <25.00           Negative 25.00-29.99      Equivocal >29.99           Positive . A positive result indicates that the patient has antibody to measles virus. It does not differentiate  between an active or past infection. The clinical  diagnosis must be interpreted in conjunction with  clinical signs and symptoms of the patient.    Mumps IgG 182.00 AU/mL    Comment:  AU/mL           Interpretation -------         ---------------- <9.00             Negative 9.00-10.99        Equivocal >10.99            Positive A positive result indicates that the patient has  antibody to mumps virus. It does not differentiate between an  active or past infection. The clinical diagnosis must be interpreted in conjunction with clinical signs and symptoms of the patient. .    Rubella 1.93 index    Comment:     Index            Interpretation     -----            --------------       <  0.90            Not consistent with Immunity     0.90-0.99        Equivocal     > or = 1.00      Consistent with Immunity  . The presence of rubella IgG antibody suggests  immunization or past or current infection with rubella  virus.   Hepatic function panel     Status: None   Collection Time: 07/10/18  8:22 AM  Result Value Ref Range   Total Protein 6.4 6.1 - 8.1 g/dL   Albumin 4.4 3.6 - 5.1 g/dL   Globulin 2.0 1.9 - 3.7 g/dL (calc)   AG Ratio 2.2 1.0 - 2.5 (calc)   Total Bilirubin 0.6 0.2 - 1.2 mg/dL   Bilirubin, Direct 0.1 0.0 - 0.2 mg/dL   Indirect Bilirubin 0.5 0.2 - 1.2 mg/dL (calc)   Alkaline phosphatase (APISO) 62 33 - 130 U/L   AST 16 10 - 35 U/L   ALT 21 6 - 29 U/L  Lipid panel     Status: Abnormal   Collection Time: 07/10/18  8:22 AM  Result Value Ref Range   Cholesterol 182 <200 mg/dL   HDL 55 >50 mg/dL   Triglycerides 89 <150 mg/dL   LDL Cholesterol (Calc) 109 (H) mg/dL (calc)    Comment: Reference range: <100 . Desirable range <100 mg/dL for primary prevention;   <70 mg/dL for patients with CHD or diabetic patients  with > or = 2 CHD risk factors. Marland Kitchen LDL-C is now calculated using the Martin-Hopkins  calculation, which is a validated novel method providing  better accuracy than the Friedewald equation in the  estimation of LDL-C.  Cresenciano Genre et al. Annamaria Helling. 3646;803(21): 2061-2068  (http://education.[facility].com/faq/FAQ164)    Total CHOL/HDL Ratio 3.3 <5.0 (calc)   Non-HDL Cholesterol (Calc) 127 <130 mg/dL (calc)    Comment: For patients with diabetes plus 1 major ASCVD risk  factor, treating to a non-HDL-C goal of <100 mg/dL  (LDL-C of <70 mg/dL) is considered a therapeutic  option.   Basic Metabolic Panel (BMET)     Status: Abnormal   Collection Time: 07/10/18  8:22 AM  Result Value Ref Range   Glucose, Bld 102 (H) 65 - 99 mg/dL    Comment: .            Fasting reference interval . For someone without known diabetes, a glucose value between 100 and 125 mg/dL is consistent with prediabetes and should be confirmed with a follow-up test. .    BUN 4 (L) 7 - 25 mg/dL   Creat 0.76 0.50 - 1.05 mg/dL    Comment: For patients >27 years of age, the reference limit for  Creatinine is approximately 13% higher for people identified as African-American. .    BUN/Creatinine Ratio 5 (L) 6 - 22 (calc)   Sodium 141 135 - 146 mmol/L    Comment: Verified by repeat analysis. .    Potassium 4.4 3.5 - 5.3 mmol/L    Comment: Verified by repeat analysis. .    Chloride 103 98 - 110 mmol/L    Comment: Verified by repeat analysis. .    CO2 23 20 - 32 mmol/L    Comment: Verified by repeat analysis. .    Calcium 9.4 8.6 - 10.4 mg/dL  Cancer antigen 27.29     Status: None   Collection Time: 08/28/18 10:44 AM  Result Value Ref Range   CA 27.29 13.9 0.0 - 38.6  U/mL    Comment: (NOTE) Siemens Centaur Immunochemiluminometric Methodology [facility]) Values obtained with different assay methods or kits cannot be used interchangeably. Results cannot be interpreted as absolute evidence of the presence or absence of malignant disease. Performed At: [facility] [place], [place] [id number] Rush Farmer MD HW:[id number]   Comprehensive metabolic panel     Status: Abnormal   Collection Time: 08/28/18 10:44 AM  Result Value Ref Range   Sodium 131 (L) 135 - 145 mmol/L   Potassium 3.2 (L) 3.5 - 5.1 mmol/L   Chloride 92 (L) 98 - 111 mmol/L   CO2 27 22 - 32 mmol/L   Glucose, Bld 92 70 - 99 mg/dL   BUN 12 6 - 20 mg/dL   Creatinine, Ser 0.76 0.44 - 1.00 mg/dL   Calcium 9.3 8.9 - 10.3 mg/dL   Total Protein 7.6 6.5 - 8.1 g/dL   Albumin 5.0 3.5 - 5.0 g/dL   AST 20 15 - 41 U/L   ALT 19 0 - 44 U/L   Alkaline Phosphatase 59 38 - 126 U/L   Total Bilirubin 0.9 0.3 - 1.2 mg/dL   GFR calc non Af Amer >60 >60 mL/min   GFR calc Af Amer >60 >60 mL/min   Anion gap 12 5 - 15    Comment: Performed at [facility], [address] [place], [place] [address]  CBC with Differential     Status: None   Collection Time: 08/28/18 10:44 AM  Result Value Ref Range   WBC 4.1 4.0 - 10.5 K/uL   RBC 4.28 3.87 - 5.11 MIL/uL   Hemoglobin 13.3 12.0 - 15.0 g/dL    HCT 38.4 36.0 - 46.0 %   MCV 89.7 80.0 - 100.0 fL   MCH 31.1 26.0 - 34.0 pg   MCHC 34.6 30.0 - 36.0 g/dL   RDW 12.8 11.5 - 15.5 %   Platelets 222 150 - 400 K/uL   nRBC 0.0 0.0 - 0.2 %   Neutrophils Relative % 60 %   Neutro Abs 2.5 1.7 - 7.7 K/uL   Lymphocytes Relative 27 %   Lymphs Abs 1.1 0.7 - 4.0 K/uL   Monocytes Relative 9 %   Monocytes Absolute 0.4 0.1 - 1.0 K/uL   Eosinophils Relative 2 %   Eosinophils Absolute 0.1 0.0 - 0.5 K/uL   Basophils Relative 1 %   Basophils Absolute 0.0 0.0 - 0.1 K/uL   Immature Granulocytes 1 %   Abs Immature Granulocytes 0.02 0.00 - 0.07 K/uL    Comment: Performed at [facility], [address] [place], [place] [address]   Objective  Body mass index is 26.92 kg/m. Wt Readings from Last 3 Encounters:  09/18/18 161 lb 12.8 oz (73.4 kg)  08/28/18 165 lb 12.8 oz (75.2 kg)  05/22/18 189 lb (85.7 kg)   Temp Readings from Last 3 Encounters:  09/18/18 (!) 97.1 F (36.2 C) (Oral)  05/22/18 98.2 F (36.8 C) (Oral)  02/10/18 97.7 F (36.5 C) (Oral)   BP Readings from Last 3 Encounters:  09/18/18 (!) 148/98  08/28/18 (!) 147/94  05/22/18 (!) 146/40   Pulse Readings from Last 3 Encounters:  09/18/18 80  08/28/18 73  05/22/18 72    Physical Exam Vitals signs and nursing note reviewed.  Constitutional:      Appearance: Normal appearance.  HENT:     Head: Normocephalic and atraumatic.     Nose: Nose normal.     Mouth/Throat:  Mouth: Mucous membranes are moist.     Pharynx: Oropharynx is clear.  Eyes:     Pupils: Pupils are equal, round, and reactive to light.  Neck:      Comments: Right neck pain subjective no objective on exam  Cardiovascular:     Rate and Rhythm: Normal rate and regular rhythm.     Pulses: Normal pulses.     Heart sounds: Normal heart sounds.  Pulmonary:     Effort: Pulmonary effort is normal.     Breath sounds: Normal breath sounds.  Skin:    General: Skin is warm and dry.  Neurological:      General: No focal deficit present.     Mental Status: She is alert and oriented to person, place, and time.     Gait: Gait normal.  Psychiatric:        Attention and Perception: Attention and perception normal.        Mood and Affect: Mood and affect normal.        Speech: Speech normal.        Behavior: Behavior normal. Behavior is cooperative.        Thought Content: Thought content normal.        Cognition and Memory: Cognition and memory normal.        Judgment: Judgment normal.     Assessment   1. HTN uncontrolled  2. hypoK likely related diuretic  3. Sinusitis and sore throat  4. overwt 26.92  5. Anxiety and depression improved  6. HM Plan   1. Change hctz 25 to triamterine hctz 37.5-25 mg qd  2. See above given high K food list  3. zpack otc meds  Prn mobic if does not help consider thyroid US/neck US if pain does not improve could be thyroiditis vs enlarged lymph node due to URI/sinusitis  4. rec exercise to lose  5. Cont meds and f/u Dr. Nicolasa Ducking  6.  Had flu shot and pna 23  vx  Disc shingrix today  tdap had 05/13/12 Hep B 2/3 today  MMR immune  dexa 09/07/15 osteopenia  07/03/12 diverticulosis h/o hyperplastic polyp f/u in 10 years  S/p hysterectomy for DUB, fibroids pap neg 05/13/12  Dx mammo 09/15/18 negative h/o breast cancer H/o right breast cancer 5 years ago Never smoker  Derm appt pending Dr. Phillip Heal  Provider: Dr. Olivia Mackie McLean-Scocuzza-Internal Medicine

## 2018-10-07 DIAGNOSIS — F331 Major depressive disorder, recurrent, moderate: Secondary | ICD-10-CM | POA: Diagnosis not present

## 2018-10-13 ENCOUNTER — Other Ambulatory Visit: Payer: BLUE CROSS/BLUE SHIELD

## 2018-11-25 ENCOUNTER — Ambulatory Visit: Payer: BLUE CROSS/BLUE SHIELD

## 2018-11-26 ENCOUNTER — Ambulatory Visit: Payer: BLUE CROSS/BLUE SHIELD

## 2018-12-18 ENCOUNTER — Other Ambulatory Visit: Payer: Self-pay | Admitting: Internal Medicine

## 2018-12-18 DIAGNOSIS — G8929 Other chronic pain: Secondary | ICD-10-CM

## 2018-12-18 DIAGNOSIS — M545 Low back pain, unspecified: Secondary | ICD-10-CM

## 2018-12-18 DIAGNOSIS — J329 Chronic sinusitis, unspecified: Secondary | ICD-10-CM

## 2018-12-18 MED ORDER — MELOXICAM 7.5 MG PO TABS: 7.5000 mg | ORAL_TABLET | Freq: Every day | ORAL | 0 refills | Status: DC | PRN

## 2019-01-20 ENCOUNTER — Ambulatory Visit (INDEPENDENT_AMBULATORY_CARE_PROVIDER_SITE_OTHER): Payer: BLUE CROSS/BLUE SHIELD | Admitting: Internal Medicine

## 2019-01-20 DIAGNOSIS — R42 Dizziness and giddiness: Secondary | ICD-10-CM

## 2019-01-20 DIAGNOSIS — E785 Hyperlipidemia, unspecified: Secondary | ICD-10-CM

## 2019-01-20 DIAGNOSIS — M2550 Pain in unspecified joint: Secondary | ICD-10-CM

## 2019-01-20 DIAGNOSIS — Z1389 Encounter for screening for other disorder: Secondary | ICD-10-CM

## 2019-01-20 DIAGNOSIS — E559 Vitamin D deficiency, unspecified: Secondary | ICD-10-CM

## 2019-01-20 DIAGNOSIS — E538 Deficiency of other specified B group vitamins: Secondary | ICD-10-CM

## 2019-01-20 DIAGNOSIS — M25532 Pain in left wrist: Secondary | ICD-10-CM | POA: Insufficient documentation

## 2019-01-20 DIAGNOSIS — Z1329 Encounter for screening for other suspected endocrine disorder: Secondary | ICD-10-CM

## 2019-01-20 DIAGNOSIS — I10 Essential (primary) hypertension: Secondary | ICD-10-CM

## 2019-01-20 MED ORDER — TELMISARTAN 20 MG PO TABS: 20.0000 mg | ORAL_TABLET | Freq: Every day | ORAL | 2 refills | Status: DC

## 2019-01-20 NOTE — Progress Notes (Addendum)
Virtual Visit via Video Note Doxy  I connected with Danielle Sparks   on 01/20/19 at  9:40 AM EDT by a video enabled telemedicine application and verified that I am speaking with the correct person using two identifiers.  Location patient: home Location provider:work  Persons participating in the virtual visit: patient, provider  I discussed the limitations of evaluation and management by telemedicine and the availability of in person appointments. The patient expressed understanding and agreed to proceed.   HPI: 1. HTN uncontrolled on Norvasc 5 mg qam and maxzide 37.5-25 mg qam BP today 151/94 and FH HTN 2. Mood and anxiety good being [place] the [place] or [place] help her/ She does have reduced sleep but sees psych and will disc with them  3. Cough prilosec 20 mg otc is helping  4. She does report dizziness at times with lying down in the bed which could be related to #1 if continues discuss rec MRI brain  5. HLD she does not reg. Take lipitor 20 mg qhs  6. C/o left wrist swelling and pain no h/o trauma  Tried icing mobic pain is throbbing she thinks it is arthritis pain is aggravating. Also has left foot top of foot know f/u with Dr. Elvina Mattes for this but this is not painful    ROS: See pertinent positives and negatives per HPI.  Past Medical History:  Diagnosis Date  . Breast cancer ([place]) 09/2012   chemo and radiation right  . Bunion   . Cancer ([place])    breast  . Depression   . Hyperlipidemia   . Hypertension   . Other vitamin B12 deficiency anemia   . Other vitamin B12 deficiency anemia   . Personal history of chemotherapy   . Personal history of radiation therapy   . Postmenopausal atrophic vaginitis     Past Surgical History:  Procedure Laterality Date  . ABDOMINAL HYSTERECTOMY    . BREAST BIOPSY Left 2002   stereotactic  . breast clip     left breast  . BREAST EXCISIONAL BIOPSY Right 2014   positive  . BUNIONECTOMY     bilateral  . GANGLION CYST EXCISION  01-09-10    Removed   . WISDOM TOOTH EXTRACTION      Family History  Problem Relation Age of Onset  . Heart disease Father   . Skin cancer Father   . Prostate cancer Father   . COPD Father   . Skin cancer Mother   . Heart disease Mother        afib  . Hyperlipidemia Mother   . Breast cancer Paternal Aunt 4  . Breast cancer Cousin 16  . Hyperlipidemia Sister   . Colon cancer Neg Hx   . Rectal cancer Neg Hx   . Stomach cancer Neg Hx     SOCIAL HX: lives at home with husband   Current Outpatient Medications:  .  omeprazole (PRILOSEC OTC) 20 MG tablet, Take 20 mg by mouth daily., Disp: , Rfl:  .  amLODipine (NORVASC) 5 MG tablet, Take 1 tablet (5 mg total) by mouth daily., Disp: 90 tablet, Rfl: 3 .  atorvastatin (LIPITOR) 20 MG tablet, Take 1 tablet (20 mg total) by mouth daily at 6 PM., Disp: 90 tablet, Rfl: 1 .  buPROPion (WELLBUTRIN) 75 MG tablet, Take 150 mg by mouth 2 (two) times daily. , Disp: , Rfl:  .  calcium-vitamin D (OSCAL WITH D) 500-200 MG-UNIT tablet, Take 1 tablet by mouth., Disp: , Rfl:  .  cholecalciferol (VITAMIN D) 400 units TABS tablet, Take 400 Units by mouth., Disp: , Rfl:  .  clonazePAM (KLONOPIN) 0.5 MG tablet, , Disp: , Rfl:  .  hydrocortisone 2.5 % cream, Apply topically 2 (two) times daily., Disp: 30 g, Rfl: 2 .  lamoTRIgine (LAMICTAL) 100 MG tablet, Take 1 tablet (100 mg total) by mouth daily., Disp: , Rfl:  .  meloxicam (MOBIC) 7.5 MG tablet, Take 1 tablet (7.5 mg total) by mouth daily as needed for pain., Disp: 90 tablet, Rfl: 0 .  sertraline (ZOLOFT) 100 MG tablet, Take 150 mg by mouth daily., Disp: , Rfl:  .  telmisartan (MICARDIS) 20 MG tablet, Take 1 tablet (20 mg total) by mouth daily. In the am, Disp: 30 tablet, Rfl: 2 .  triamterene-hydrochlorothiazide (MAXZIDE-25) 37.5-25 MG tablet, Take 1 tablet by mouth daily., Disp: 90 tablet, Rfl: 3  EXAM:  VITALS per patient if applicable:  GENERAL: alert, oriented, appears well and in no acute  distress  HEENT: atraumatic, conjunttiva clear, no obvious abnormalities on inspection of external nose and ears  NECK: normal movements of the head and neck  LUNGS: on inspection no signs of respiratory distress, breathing rate appears normal, no obvious gross SOB, gasping or wheezing  CV: no obvious cyanosis  MS: moves all visible extremities without noticeable abnormality  PSYCH/NEURO: pleasant and cooperative, no obvious depression or anxiety, speech and thought processing grossly intact  ASSESSMENT AND PLAN:  Discussed the following assessment and plan:  Essential hypertension - Plan: add telmisartan (MICARDIS) 20 MG tablet in am and norvasc 5 mg qhs, maxzide 37.5-25 in the am  My chart or call back in 2 weeks with BP readings   Left wrist pain - Plan: DG Wrist Complete Left  Hyperlipidemia, unspecified hyperlipidemia type - Plan: Lipid panel -not reg. Taking lipitor 20 mg qhs   Arthralgia, unspecified joint - Plan: Comprehensive metabolic panel, CBC w/Diff, Urinalysis, Routine w reflex microscopic, Antinuclear Antib (ANA), Rheumatoid Factor, Cyclic citrul peptide antibody, IgG ([facility]), Sedimentation rate, C-reactive protein -check RA labs  Xray left wrist   Vitamin D deficiency - Plan: Vitamin D (25 hydroxy)  B12 deficiency - Plan: Vitamin B12  Dizziness if continues will order MRI brain could be related uncontrolled HTN but if cont. R/o other   HM Had flu shot and pna 23  vx  Disc shingrix today  tdap had 05/13/12 Hep B 2/3 today  MMR immune  dexa 09/07/15 osteopenia need to reschedule DEXA for future ask H/o if they will schedule or want me to? 07/03/12 diverticulosis h/o hyperplastic polyp f/u in 10 years  S/p hysterectomy for DUB, fibroids pap neg 05/13/12 will ask who ob/gyn is at f/u  Dx mammo 09/15/18 negative h/o breast cancer rec screening in 1 year will ask Dr. B will he order or should I?   H/o right breast cancer 5 years ago Never smoker Derm appt  pt wants to see Dr. Kellie Moor or Nicole Kindred in the future given info    I discussed the assessment and treatment plan with the patient. The patient was provided an opportunity to ask questions and all were answered. The patient agreed with the plan and demonstrated an understanding of the instructions.   The patient was advised to call back or seek an in-person evaluation if the symptoms worsen or if the condition fails to improve as anticipated.  Dr. Nicolasa Ducking saw 01/23/2019 lamictal 150 mg qd zoloft 150 mg qd wellbutrin ir 150 bid [facility]   Dr.  Nicolasa Ducking 04/23/19 wellbutrin 75 mg 2 tabs 8 am and 2 tabs 3 pm  zoloft 150 mg qd  lamictal 150 mg qd   Dr. Nicolasa Ducking depression 11/12/19  wellbutrin 75 2 in am and 2 pm, zoloft 150 mg qd, lamictal 150 mg qd  wellbutrin to 150 mg bid  B12 217 Rx B12 injections f/u with PCP  [facility] not going currently   Time spent 25 min Delorise Jackson, MD

## 2019-01-20 NOTE — Progress Notes (Signed)
mychart sent to patient

## 2019-01-23 ENCOUNTER — Other Ambulatory Visit: Payer: Self-pay | Admitting: Internal Medicine

## 2019-01-23 ENCOUNTER — Encounter: Payer: Self-pay | Admitting: Internal Medicine

## 2019-01-23 DIAGNOSIS — F39 Unspecified mood [affective] disorder: Secondary | ICD-10-CM | POA: Diagnosis not present

## 2019-01-23 DIAGNOSIS — W57XXXA Bitten or stung by nonvenomous insect and other nonvenomous arthropods, initial encounter: Secondary | ICD-10-CM

## 2019-01-23 DIAGNOSIS — F411 Generalized anxiety disorder: Secondary | ICD-10-CM | POA: Diagnosis not present

## 2019-01-23 DIAGNOSIS — L309 Dermatitis, unspecified: Secondary | ICD-10-CM

## 2019-01-23 MED ORDER — HYDROCORTISONE 2.5 % EX CREA: TOPICAL_CREAM | Freq: Two times a day (BID) | CUTANEOUS | 12 refills | Status: DC

## 2019-01-26 ENCOUNTER — Other Ambulatory Visit: Payer: Self-pay

## 2019-01-28 ENCOUNTER — Other Ambulatory Visit: Payer: Self-pay

## 2019-01-28 ENCOUNTER — Other Ambulatory Visit (INDEPENDENT_AMBULATORY_CARE_PROVIDER_SITE_OTHER): Payer: BLUE CROSS/BLUE SHIELD

## 2019-01-28 ENCOUNTER — Ambulatory Visit (INDEPENDENT_AMBULATORY_CARE_PROVIDER_SITE_OTHER): Payer: BLUE CROSS/BLUE SHIELD

## 2019-01-28 DIAGNOSIS — E538 Deficiency of other specified B group vitamins: Secondary | ICD-10-CM

## 2019-01-28 DIAGNOSIS — Z1329 Encounter for screening for other suspected endocrine disorder: Secondary | ICD-10-CM | POA: Diagnosis not present

## 2019-01-28 DIAGNOSIS — Z1389 Encounter for screening for other disorder: Secondary | ICD-10-CM | POA: Diagnosis not present

## 2019-01-28 DIAGNOSIS — E559 Vitamin D deficiency, unspecified: Secondary | ICD-10-CM | POA: Diagnosis not present

## 2019-01-28 DIAGNOSIS — E785 Hyperlipidemia, unspecified: Secondary | ICD-10-CM

## 2019-01-28 DIAGNOSIS — M25532 Pain in left wrist: Secondary | ICD-10-CM

## 2019-01-28 DIAGNOSIS — I10 Essential (primary) hypertension: Secondary | ICD-10-CM | POA: Diagnosis not present

## 2019-01-28 DIAGNOSIS — M2550 Pain in unspecified joint: Secondary | ICD-10-CM | POA: Diagnosis not present

## 2019-01-28 DIAGNOSIS — M7989 Other specified soft tissue disorders: Secondary | ICD-10-CM | POA: Diagnosis not present

## 2019-01-28 LAB — COMPREHENSIVE METABOLIC PANEL
ALT: 14 U/L (ref 0–35)
AST: 14 U/L (ref 0–37)
Albumin: 4.7 g/dL (ref 3.5–5.2)
Alkaline Phosphatase: 53 U/L (ref 39–117)
BUN: 12 mg/dL (ref 6–23)
CO2: 32 mEq/L (ref 19–32)
Calcium: 10.1 mg/dL (ref 8.4–10.5)
Chloride: 95 mEq/L — ABNORMAL LOW (ref 96–112)
Creatinine, Ser: 0.83 mg/dL (ref 0.40–1.20)
GFR: 70.14 mL/min (ref >60.00)
Glucose, Bld: 85 mg/dL (ref 70–99)
Potassium: 4 mEq/L (ref 3.5–5.1)
Sodium: 136 mEq/L (ref 135–145)
Total Bilirubin: 0.7 mg/dL (ref 0.2–1.2)
Total Protein: 6.6 g/dL (ref 6.0–8.3)

## 2019-01-28 LAB — CBC WITH DIFFERENTIAL/PLATELET
Basophils Absolute: 0 10*3/uL (ref 0.0–0.1)
Basophils Relative: 0.7 % (ref 0.0–3.0)
Eosinophils Absolute: 0.1 10*3/uL (ref 0.0–0.7)
Eosinophils Relative: 1.4 % (ref 0.0–5.0)
HCT: 39.8 % (ref 36.0–46.0)
Hemoglobin: 13.7 g/dL (ref 12.0–15.0)
Lymphocytes Relative: 25.9 % (ref 12.0–46.0)
Lymphs Abs: 1.1 10*3/uL (ref 0.7–4.0)
MCHC: 34.4 g/dL (ref 30.0–36.0)
MCV: 94.6 fl (ref 78.0–100.0)
Monocytes Absolute: 0.3 10*3/uL (ref 0.1–1.0)
Monocytes Relative: 7.6 % (ref 3.0–12.0)
Neutro Abs: 2.7 10*3/uL (ref 1.4–7.7)
Neutrophils Relative %: 64.4 % (ref 43.0–77.0)
Platelets: 284 10*3/uL (ref 150.0–400.0)
RBC: 4.21 Mil/uL (ref 3.87–5.11)
RDW: 13.2 % (ref 11.5–15.5)
WBC: 4.2 10*3/uL (ref 4.0–10.5)

## 2019-01-28 LAB — VITAMIN B12: Vitamin B-12: 217 pg/mL (ref 211–911)

## 2019-01-28 LAB — LIPID PANEL
Cholesterol: 280 mg/dL — ABNORMAL HIGH (ref 0–200)
HDL: 66.4 mg/dL (ref >39.00)
LDL Cholesterol: 201 mg/dL — ABNORMAL HIGH (ref 0–99)
NonHDL: 213.75
Total CHOL/HDL Ratio: 4
Triglycerides: 65 mg/dL (ref 0.0–149.0)
VLDL: 13 mg/dL (ref 0.0–40.0)

## 2019-01-28 LAB — SEDIMENTATION RATE: Sed Rate: 6 mm/hr (ref 0–30)

## 2019-01-28 LAB — TSH: TSH: 0.97 u[IU]/mL (ref 0.35–4.50)

## 2019-01-28 LAB — VITAMIN D 25 HYDROXY (VIT D DEFICIENCY, FRACTURES): VITD: 43.96 ng/mL (ref 30.00–100.00)

## 2019-01-28 LAB — C-REACTIVE PROTEIN: CRP: <1 mg/dL (ref 0.5–20.0)

## 2019-01-29 LAB — URINALYSIS, ROUTINE W REFLEX MICROSCOPIC
Bilirubin Urine: NEGATIVE
Glucose, UA: NEGATIVE
Hgb urine dipstick: NEGATIVE
Ketones, ur: NEGATIVE
Leukocytes,Ua: NEGATIVE
Nitrite: NEGATIVE
Protein, ur: NEGATIVE
Specific Gravity, Urine: 1.01 (ref 1.001–1.03)
pH: 6.5 (ref 5.0–8.0)

## 2019-01-30 LAB — RHEUMATOID FACTOR: Rheumatoid fact SerPl-aCnc: <14 IU/mL (ref <14)

## 2019-01-30 LAB — ANA: Anti Nuclear Antibody (ANA): NEGATIVE

## 2019-01-30 LAB — CYCLIC CITRUL PEPTIDE ANTIBODY, IGG: Cyclic Citrullin Peptide Ab: <16 UNITS

## 2019-02-02 ENCOUNTER — Other Ambulatory Visit: Payer: Self-pay | Admitting: Internal Medicine

## 2019-02-02 DIAGNOSIS — E785 Hyperlipidemia, unspecified: Secondary | ICD-10-CM

## 2019-02-02 MED ORDER — ATORVASTATIN CALCIUM 40 MG PO TABS: 40.0000 mg | ORAL_TABLET | Freq: Every day | ORAL | 3 refills | Status: DC

## 2019-02-04 ENCOUNTER — Other Ambulatory Visit: Payer: Self-pay | Admitting: *Deleted

## 2019-02-04 DIAGNOSIS — C50911 Malignant neoplasm of unspecified site of right female breast: Secondary | ICD-10-CM

## 2019-02-18 ENCOUNTER — Other Ambulatory Visit: Payer: Self-pay | Admitting: Internal Medicine

## 2019-02-18 ENCOUNTER — Telehealth: Payer: Self-pay | Admitting: Internal Medicine

## 2019-02-18 DIAGNOSIS — I10 Essential (primary) hypertension: Secondary | ICD-10-CM

## 2019-02-18 MED ORDER — TELMISARTAN 20 MG PO TABS: 20.0000 mg | ORAL_TABLET | Freq: Every day | ORAL | 3 refills | Status: DC

## 2019-02-18 NOTE — Telephone Encounter (Signed)
Copied from [place] [phone]. Topic: General - Other >> Feb 18, 2019 10:59 AM Keene Breath wrote: Reason for CRM: Patient called to request that her script for telmisartan (MICARDIS) 20 MG tablet be sent in for a 90-day supply.  Patient stated that it has been working well for her BP and the doctor told her to let her know if it helped.  Please advise and call patient back at [phone]

## 2019-03-19 ENCOUNTER — Other Ambulatory Visit: Payer: Self-pay | Admitting: Internal Medicine

## 2019-03-19 DIAGNOSIS — G8929 Other chronic pain: Secondary | ICD-10-CM

## 2019-03-19 DIAGNOSIS — J329 Chronic sinusitis, unspecified: Secondary | ICD-10-CM

## 2019-03-19 MED ORDER — MELOXICAM 7.5 MG PO TABS: 7.5000 mg | ORAL_TABLET | Freq: Every day | ORAL | 3 refills | Status: DC | PRN

## 2019-04-23 DIAGNOSIS — F411 Generalized anxiety disorder: Secondary | ICD-10-CM | POA: Diagnosis not present

## 2019-04-23 DIAGNOSIS — F39 Unspecified mood [affective] disorder: Secondary | ICD-10-CM | POA: Diagnosis not present

## 2019-05-22 ENCOUNTER — Other Ambulatory Visit: Payer: Self-pay

## 2019-05-22 DIAGNOSIS — Z20822 Contact with and (suspected) exposure to covid-19: Secondary | ICD-10-CM

## 2019-05-22 DIAGNOSIS — R6889 Other general symptoms and signs: Secondary | ICD-10-CM | POA: Diagnosis not present

## 2019-05-23 LAB — NOVEL CORONAVIRUS, NAA: SARS-CoV-2, NAA: NOT DETECTED

## 2019-06-02 DIAGNOSIS — H5203 Hypermetropia, bilateral: Secondary | ICD-10-CM | POA: Diagnosis not present

## 2019-08-12 DIAGNOSIS — F411 Generalized anxiety disorder: Secondary | ICD-10-CM | POA: Diagnosis not present

## 2019-08-12 DIAGNOSIS — F39 Unspecified mood [affective] disorder: Secondary | ICD-10-CM | POA: Diagnosis not present

## 2019-08-25 ENCOUNTER — Inpatient Hospital Stay: Payer: BC Managed Care – PPO | Attending: Internal Medicine

## 2019-08-25 ENCOUNTER — Other Ambulatory Visit: Payer: Self-pay | Admitting: Licensed Clinical Social Worker

## 2019-08-25 ENCOUNTER — Other Ambulatory Visit: Payer: Self-pay

## 2019-08-25 DIAGNOSIS — Z79899 Other long term (current) drug therapy: Secondary | ICD-10-CM | POA: Insufficient documentation

## 2019-08-25 DIAGNOSIS — F329 Major depressive disorder, single episode, unspecified: Secondary | ICD-10-CM | POA: Diagnosis not present

## 2019-08-25 DIAGNOSIS — I10 Essential (primary) hypertension: Secondary | ICD-10-CM | POA: Insufficient documentation

## 2019-08-25 DIAGNOSIS — M8580 Other specified disorders of bone density and structure, unspecified site: Secondary | ICD-10-CM | POA: Insufficient documentation

## 2019-08-25 DIAGNOSIS — C50811 Malignant neoplasm of overlapping sites of right female breast: Secondary | ICD-10-CM

## 2019-08-25 DIAGNOSIS — Z170 Estrogen receptor positive status [ER+]: Secondary | ICD-10-CM | POA: Insufficient documentation

## 2019-08-25 DIAGNOSIS — E785 Hyperlipidemia, unspecified: Secondary | ICD-10-CM | POA: Diagnosis not present

## 2019-08-25 DIAGNOSIS — Z853 Personal history of malignant neoplasm of breast: Secondary | ICD-10-CM | POA: Insufficient documentation

## 2019-08-25 LAB — CBC WITH DIFFERENTIAL/PLATELET
Abs Immature Granulocytes: 0.01 10*3/uL (ref 0.00–0.07)
Basophils Absolute: 0 10*3/uL (ref 0.0–0.1)
Basophils Relative: 1 %
Eosinophils Absolute: 0.1 10*3/uL (ref 0.0–0.5)
Eosinophils Relative: 1 %
HCT: 36.1 % (ref 36.0–46.0)
Hemoglobin: 12.1 g/dL (ref 12.0–15.0)
Immature Granulocytes: 0 %
Lymphocytes Relative: 22 %
Lymphs Abs: 1.1 10*3/uL (ref 0.7–4.0)
MCH: 32.1 pg (ref 26.0–34.0)
MCHC: 33.5 g/dL (ref 30.0–36.0)
MCV: 95.8 fL (ref 80.0–100.0)
Monocytes Absolute: 0.4 10*3/uL (ref 0.1–1.0)
Monocytes Relative: 7 %
Neutro Abs: 3.5 10*3/uL (ref 1.7–7.7)
Neutrophils Relative %: 69 %
Platelets: 227 10*3/uL (ref 150–400)
RBC: 3.77 MIL/uL — ABNORMAL LOW (ref 3.87–5.11)
RDW: 12.8 % (ref 11.5–15.5)
WBC: 5.1 10*3/uL (ref 4.0–10.5)
nRBC: 0 % (ref 0.0–0.2)

## 2019-08-25 LAB — COMPREHENSIVE METABOLIC PANEL
ALT: 17 U/L (ref 0–44)
AST: 18 U/L (ref 15–41)
Albumin: 4.6 g/dL (ref 3.5–5.0)
Alkaline Phosphatase: 43 U/L (ref 38–126)
Anion gap: 8 (ref 5–15)
BUN: 15 mg/dL (ref 6–20)
CO2: 29 mmol/L (ref 22–32)
Calcium: 9.6 mg/dL (ref 8.9–10.3)
Chloride: 99 mmol/L (ref 98–111)
Creatinine, Ser: 0.8 mg/dL (ref 0.44–1.00)
GFR calc Af Amer: >60 mL/min (ref >60)
GFR calc non Af Amer: >60 mL/min (ref >60)
Glucose, Bld: 96 mg/dL (ref 70–99)
Potassium: 3.9 mmol/L (ref 3.5–5.1)
Sodium: 136 mmol/L (ref 135–145)
Total Bilirubin: 0.8 mg/dL (ref 0.3–1.2)
Total Protein: 7.1 g/dL (ref 6.5–8.1)

## 2019-08-26 LAB — CANCER ANTIGEN 27.29: CA 27.29: 11.3 U/mL (ref 0.0–38.6)

## 2019-08-26 IMAGING — DX LEFT WRIST - COMPLETE 3+ VIEW
4 series · 4 of 4 positions shown · non-contrast
Comparison: None.

CLINICAL DATA: Left wrist pain and swelling

EXAM:
LEFT WRIST - COMPLETE 3+ VIEW

[wrist pa]
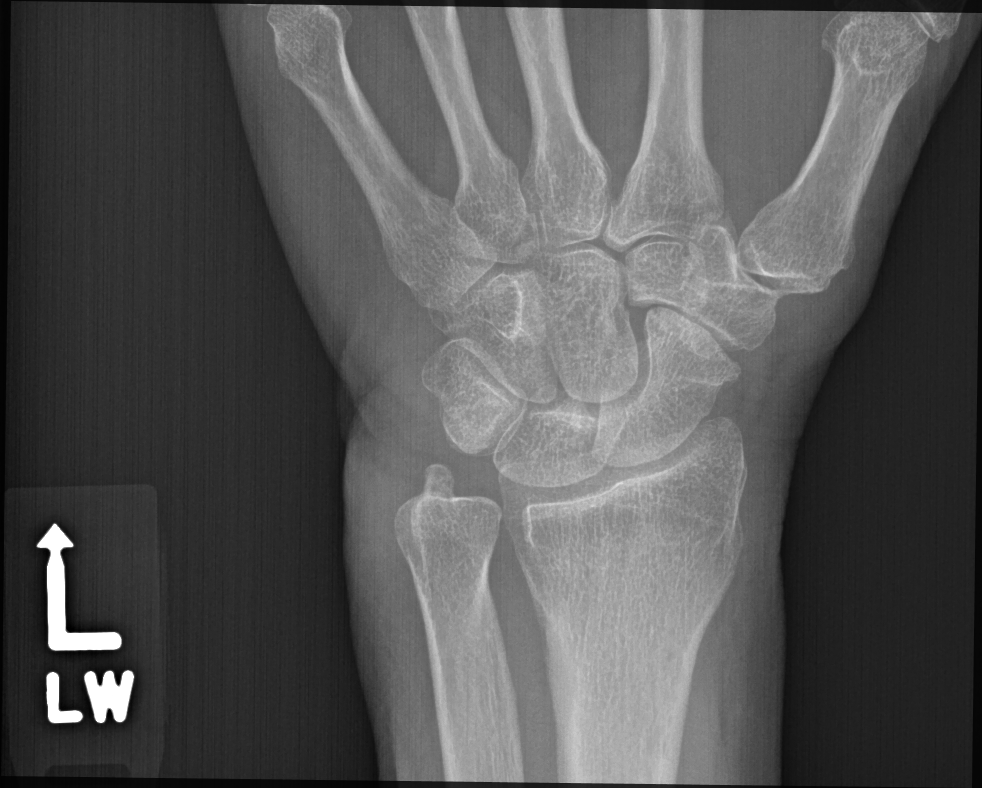

[wrist obl (oblique)]
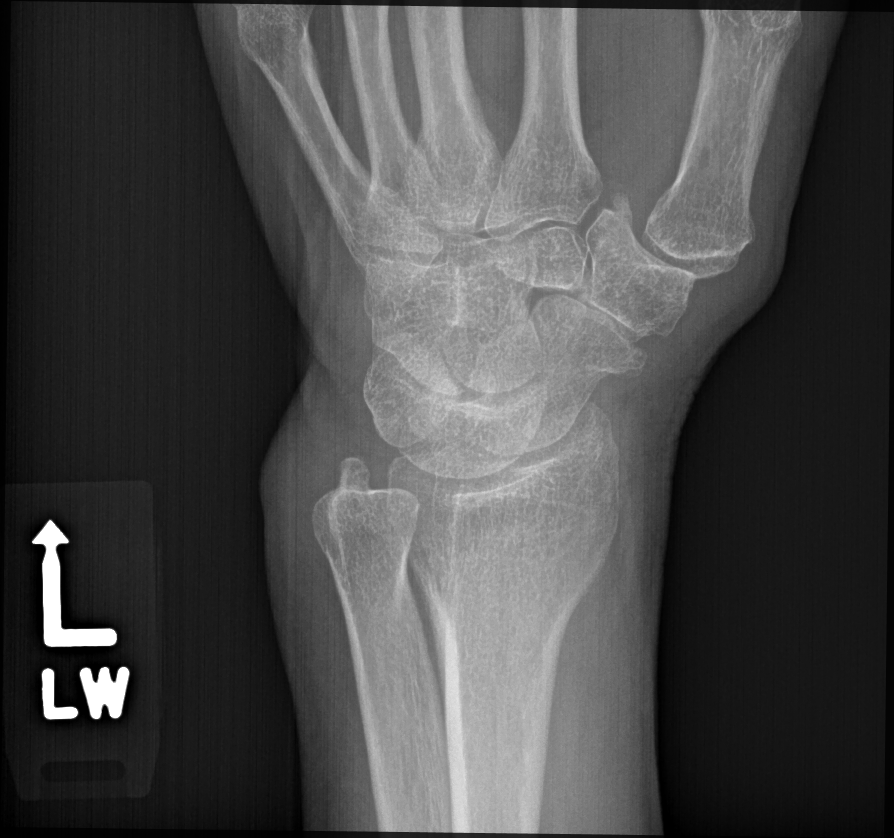

[wrist lat]
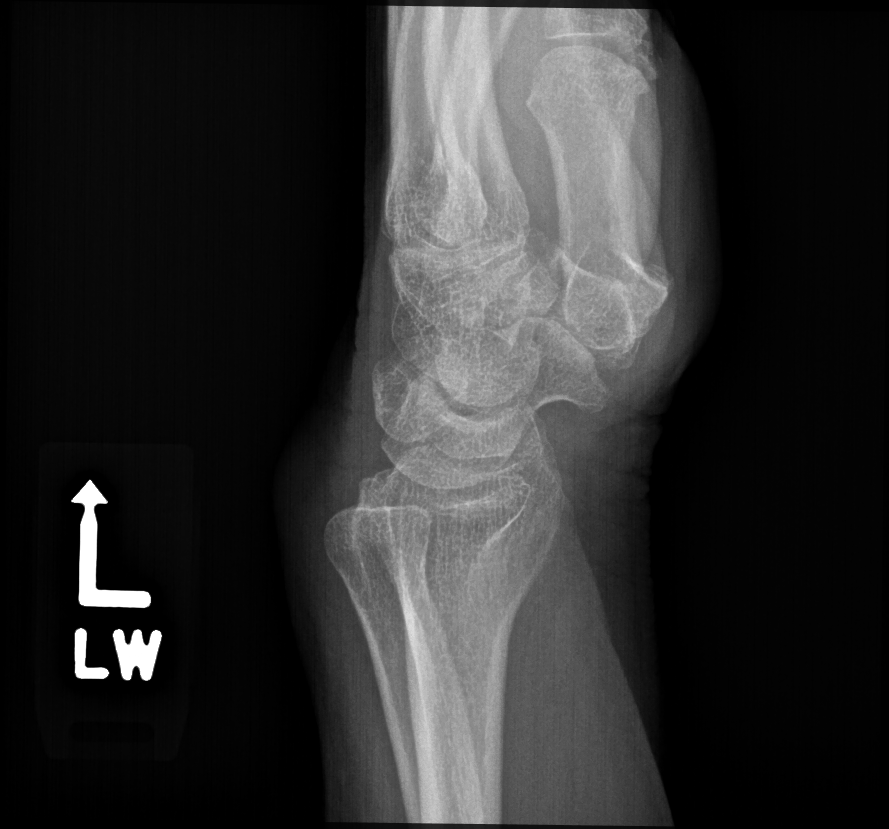

[scaphoid pa]
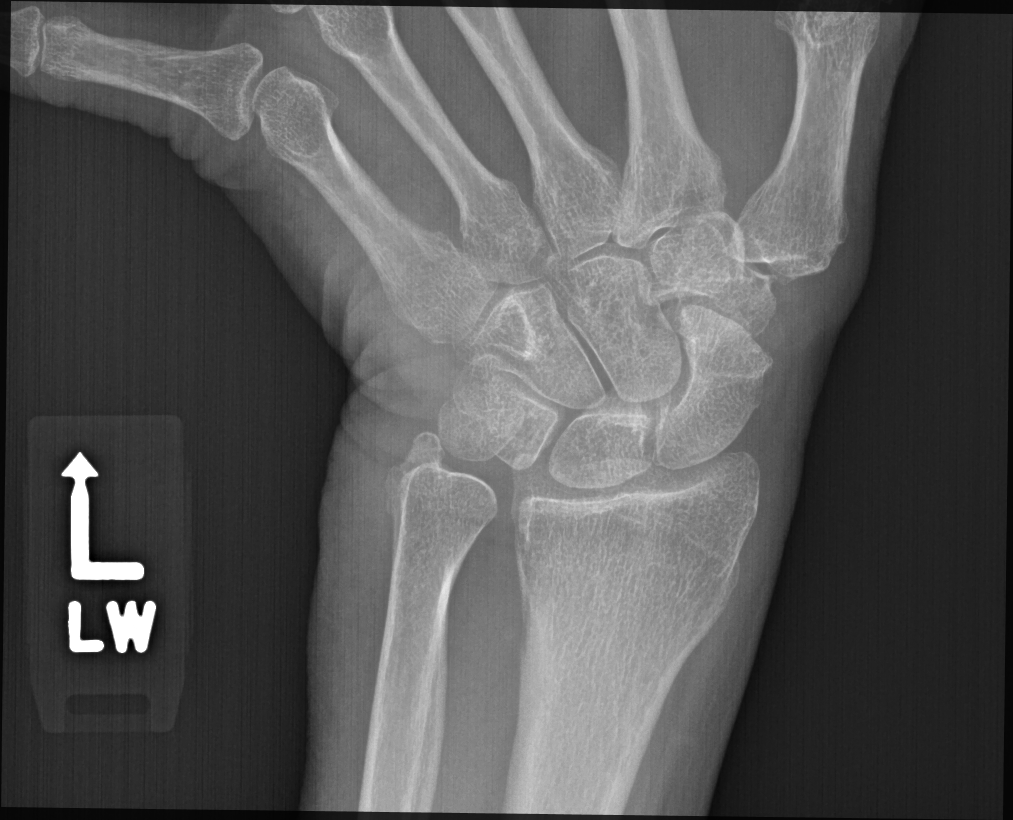

[4 of 4 positions shown; findings below may reference images not displayed]

FINDINGS: There is no evidence of fracture or dislocation. There is no
evidence of arthropathy or other focal bone abnormality. Soft
tissues are unremarkable.
IMPRESSION: Negative.

## 2019-08-27 ENCOUNTER — Inpatient Hospital Stay (HOSPITAL_BASED_OUTPATIENT_CLINIC_OR_DEPARTMENT_OTHER): Payer: BC Managed Care – PPO | Admitting: Internal Medicine

## 2019-08-27 ENCOUNTER — Other Ambulatory Visit: Payer: Self-pay

## 2019-08-27 ENCOUNTER — Encounter: Payer: Self-pay | Admitting: Internal Medicine

## 2019-08-27 VITALS — BP 147/83 | HR 62 | Temp 96.7°F | Wt 146.0 lb

## 2019-08-27 DIAGNOSIS — C50811 Malignant neoplasm of overlapping sites of right female breast: Secondary | ICD-10-CM | POA: Diagnosis not present

## 2019-08-27 DIAGNOSIS — I10 Essential (primary) hypertension: Secondary | ICD-10-CM | POA: Diagnosis not present

## 2019-08-27 DIAGNOSIS — C50911 Malignant neoplasm of unspecified site of right female breast: Secondary | ICD-10-CM

## 2019-08-27 DIAGNOSIS — Z170 Estrogen receptor positive status [ER+]: Secondary | ICD-10-CM | POA: Diagnosis not present

## 2019-08-27 DIAGNOSIS — Z853 Personal history of malignant neoplasm of breast: Secondary | ICD-10-CM | POA: Diagnosis not present

## 2019-08-27 DIAGNOSIS — F329 Major depressive disorder, single episode, unspecified: Secondary | ICD-10-CM | POA: Diagnosis not present

## 2019-08-27 DIAGNOSIS — M8580 Other specified disorders of bone density and structure, unspecified site: Secondary | ICD-10-CM | POA: Diagnosis not present

## 2019-08-27 DIAGNOSIS — Z79899 Other long term (current) drug therapy: Secondary | ICD-10-CM | POA: Diagnosis not present

## 2019-08-27 DIAGNOSIS — E785 Hyperlipidemia, unspecified: Secondary | ICD-10-CM | POA: Diagnosis not present

## 2019-08-27 NOTE — Assessment & Plan Note (Addendum)
#  Stage II breast cancer ER/PR positive HER-2/neu negative-currently off arimidex since dec 2016 [sec to SEs].  STABLE.   # Clinically no evidence of recurrence.  Await mammogram December 2020.  I reviewed the pathology/stage of cancer; given a copy of her pathology report.  # chronic joint pains- STABLE.  Continue vitamin D calcium and/chondroitin.  Vitamin D glucosamine chondroitin.   # Depression/bipolar-STABLE; zoloft/wellbutrin/Lamictal-better controlled  [Dr. Nicolasa Ducking.].   # OSTEOPENIA- DEC 2016- BMD -2.3;  On ca+vit D.  Reviewed the bone density.  Recommend a bone density along with a mammogram end of the December 2020.  Discussed regarding exercise; also other treatment options available.  Will call to discuss results.   # DISPOSITION: # 12 months-MD; labs-cbc/cmp/ca-27-29  Few days prior-Dr.B

## 2019-08-27 NOTE — Progress Notes (Signed)
[facility] OFFICE PROGRESS NOTE  Patient Care Team: McLean-Scocuzza, Nino Glow, MD as PCP - General (Internal Medicine)   SUMMARY OF HEMATOLOGIC/ONCOLOGIC HISTORY:  Oncology History Overview Note  # FEB 2014- BREAST CANCER STAGE II [T1N1; 1.3cm 1/12LN pos; extranodal extension; Dr.Smith ]ER/PR-POS; Her 2 NEU-NEG. ; dd AC- Taxol weeklyx12 S/p RT; NOV 2014- Start Femara; Stop Dec 2016; MAY 2017- START Aromasin; STOPPED in [place] 2016 [joint pains]  # Chronic depression [Dr.Kapur]  # my RISK- NEG for deleterious mutation; VUS- BRIP1 gene   Breast cancer, right breast ([employer])  01/12/2013 Initial Diagnosis   Breast cancer, right breast ([place])   Carcinoma of overlapping sites of right breast in female, estrogen receptor positive [facility])   Radiation Therapy    The patient saw No care team member to display for radiation treatment. This is the current list of radiation treatment: Radiation Treatments    Patient's record has no active or historical radiation treatments documented.         INTERVAL HISTORY:  A very pleasant 58 year old female patient with above history of stage II breast cancer 2014 currently is here for follow-up; not on AI because of side effects is here for follow-up.  Patient states her depression is better controlled.  Mild to moderate fatigue.  Chronic joint pains.  Not any worse.  Denies any new lumps or bumps.  No weight loss.  No new shortness of breath or cough.  Review of Systems  Constitutional: Negative for chills, diaphoresis, fever, malaise/fatigue and weight loss.  HENT: Negative for nosebleeds and sore throat.   Eyes: Negative for double vision.  Respiratory: Negative for cough, hemoptysis, sputum production, shortness of breath and wheezing.   Cardiovascular: Negative for chest pain, palpitations, orthopnea and leg swelling.  Gastrointestinal: Negative for abdominal pain, blood in stool, constipation, diarrhea, heartburn, melena, nausea and  vomiting.  Genitourinary: Negative for dysuria, frequency and urgency.  Musculoskeletal: Positive for back pain and joint pain.  Skin: Negative.  Negative for itching and rash.  Neurological: Negative for dizziness, tingling, focal weakness, weakness and headaches.  Endo/Heme/Allergies: Does not bruise/bleed easily.  Psychiatric/Behavioral: Positive for depression. The patient is nervous/anxious. The patient does not have insomnia.      PAST MEDICAL HISTORY :  Past Medical History:  Diagnosis Date  . Breast cancer ([place]) 09/2012   chemo and radiation right  . Bunion   . Cancer ([place])    breast  . Depression   . Hyperlipidemia   . Hypertension   . Other vitamin B12 deficiency anemia   . Other vitamin B12 deficiency anemia   . Personal history of chemotherapy   . Personal history of radiation therapy   . Postmenopausal atrophic vaginitis     PAST SURGICAL HISTORY :   Past Surgical History:  Procedure Laterality Date  . ABDOMINAL HYSTERECTOMY    . BREAST BIOPSY Left 2002   stereotactic  . breast clip     left breast  . BREAST EXCISIONAL BIOPSY Right 2014   positive  . BUNIONECTOMY     bilateral  . GANGLION CYST EXCISION  01-09-10   Removed   . WISDOM TOOTH EXTRACTION      FAMILY HISTORY :   Family History  Problem Relation Age of Onset  . Heart disease Father   . Skin cancer Father   . Prostate cancer Father   . COPD Father   . Skin cancer Mother   . Heart disease Mother  afib  . Hyperlipidemia Mother   . Breast cancer Paternal Aunt 47  . Breast cancer Cousin 19  . Hyperlipidemia Sister   . Colon cancer Neg Hx   . Rectal cancer Neg Hx   . Stomach cancer Neg Hx     SOCIAL HISTORY:   Social History   Tobacco Use  . Smoking status: Never Smoker  . Smokeless tobacco: Never Used  Substance Use Topics  . Alcohol use: Yes    Comment: occasional glass of wine  . Drug use: No    ALLERGIES:  is allergic to sulfa antibiotics and xanax  [alprazolam].  MEDICATIONS:  Current Outpatient Medications  Medication Sig Dispense Refill  . amLODipine (NORVASC) 5 MG tablet Take 1 tablet (5 mg total) by mouth daily. 90 tablet 3  . buPROPion (WELLBUTRIN) 75 MG tablet Take 150 mg by mouth 2 (two) times daily.     . calcium-vitamin D (OSCAL WITH D) 500-200 MG-UNIT tablet Take 1 tablet by mouth.    . cholecalciferol (VITAMIN D) 400 units TABS tablet Take 400 Units by mouth.    . cyanocobalamin (,VITAMIN B-12,) 1000 MCG/ML injection Inject 0.4 mLs into the muscle every 14 (fourteen) days.     . hydrocortisone 2.5 % cream Apply topically 2 (two) times daily. prn 30 g 12  . lamoTRIgine (LAMICTAL) 100 MG tablet Take 1 tablet (100 mg total) by mouth daily.    . meloxicam (MOBIC) 7.5 MG tablet Take 1 tablet (7.5 mg total) by mouth daily as needed for pain. 90 tablet 3  . omeprazole (PRILOSEC OTC) 20 MG tablet Take 20 mg by mouth daily.    . sertraline (ZOLOFT) 100 MG tablet Take 150 mg by mouth daily.    Marland Kitchen telmisartan (MICARDIS) 20 MG tablet Take 1 tablet (20 mg total) by mouth daily. In the am 90 tablet 3  . triamterene-hydrochlorothiazide (MAXZIDE-25) 37.5-25 MG tablet Take 1 tablet by mouth daily. 90 tablet 3  . atorvastatin (LIPITOR) 40 MG tablet Take 1 tablet (40 mg total) by mouth daily at 6 PM. (Patient not taking: Reported on 08/27/2019) 90 tablet 3  . clonazePAM (KLONOPIN) 0.5 MG tablet      No current facility-administered medications for this visit.     PHYSICAL EXAMINATION: ECOG PERFORMANCE STATUS: 0 - Asymptomatic  BP (!) 147/83 (BP Location: Left Arm, Patient Position: Sitting, Cuff Size: Normal)   Pulse 62   Temp (!) 96.7 F (35.9 C) (Tympanic)   Wt 146 lb (66.2 kg)   BMI 24.30 kg/m   Filed Weights   08/27/19 0822  Weight: 146 lb (66.2 kg)    Physical Exam  Constitutional: She is oriented to person, place, and time and well-developed, well-nourished, and in no distress.  HENT:  Head: Normocephalic and atraumatic.   Mouth/Throat: Oropharynx is clear and moist. No oropharyngeal exudate.  Eyes: Pupils are equal, round, and reactive to light.  Neck: Normal range of motion. Neck supple.  Cardiovascular: Normal rate and regular rhythm.  Pulmonary/Chest: No respiratory distress. She has no wheezes.  Abdominal: Soft. Bowel sounds are normal. She exhibits no distension and no mass. There is no abdominal tenderness. There is no rebound and no guarding.  Musculoskeletal: Normal range of motion.        General: No tenderness or edema.  Neurological: She is alert and oriented to person, place, and time.  Skin: Skin is warm.  Right and left BREAST exam (in the presence of nurse)- no unusual skin changes or dominant masses  felt. Surgical scars noted.    Psychiatric: Affect normal.     LABORATORY DATA:  I have reviewed the data as listed    Component Value Date/Time   NA 136 08/25/2019 0824   NA 136 08/30/2014 1116   K 3.9 08/25/2019 0824   K 3.9 08/30/2014 1116   CL 99 08/25/2019 0824   CL 98 08/30/2014 1116   CO2 29 08/25/2019 0824   CO2 29 08/30/2014 1116   GLUCOSE 96 08/25/2019 0824   GLUCOSE 115 (H) 08/30/2014 1116   BUN 15 08/25/2019 0824   BUN 13 08/30/2014 1116   CREATININE 0.80 08/25/2019 0824   CREATININE 0.76 07/10/2018 0822   CALCIUM 9.6 08/25/2019 0824   CALCIUM 9.9 08/30/2014 1116   PROT 7.1 08/25/2019 0824   PROT 7.1 08/30/2014 1116   ALBUMIN 4.6 08/25/2019 0824   ALBUMIN 4.2 08/30/2014 1116   AST 18 08/25/2019 0824   AST 21 08/30/2014 1116   ALT 17 08/25/2019 0824   ALT 31 08/30/2014 1116   ALKPHOS 43 08/25/2019 0824   ALKPHOS 81 08/30/2014 1116   BILITOT 0.8 08/25/2019 0824   BILITOT 0.6 08/30/2014 1116   GFRNONAA >60 08/25/2019 0824   GFRNONAA >60 08/30/2014 1116   GFRNONAA >60 11/11/2013 1443   GFRAA >60 08/25/2019 0824   GFRAA >60 08/30/2014 1116   GFRAA >60 11/11/2013 1443    No results found for: SPEP, UPEP  Lab Results  Component Value Date   WBC 5.1  08/25/2019   NEUTROABS 3.5 08/25/2019   HGB 12.1 08/25/2019   HCT 36.1 08/25/2019   MCV 95.8 08/25/2019   PLT 227 08/25/2019      Chemistry      Component Value Date/Time   NA 136 08/25/2019 0824   NA 136 08/30/2014 1116   K 3.9 08/25/2019 0824   K 3.9 08/30/2014 1116   CL 99 08/25/2019 0824   CL 98 08/30/2014 1116   CO2 29 08/25/2019 0824   CO2 29 08/30/2014 1116   BUN 15 08/25/2019 0824   BUN 13 08/30/2014 1116   CREATININE 0.80 08/25/2019 0824   CREATININE 0.76 07/10/2018 0822      Component Value Date/Time   CALCIUM 9.6 08/25/2019 0824   CALCIUM 9.9 08/30/2014 1116   ALKPHOS 43 08/25/2019 0824   ALKPHOS 81 08/30/2014 1116   AST 18 08/25/2019 0824   AST 21 08/30/2014 1116   ALT 17 08/25/2019 0824   ALT 31 08/30/2014 1116   BILITOT 0.8 08/25/2019 0824   BILITOT 0.6 08/30/2014 1116        ASSESSMENT & PLAN:   Carcinoma of overlapping sites of right breast in female, estrogen receptor positive ([place]) # Stage II breast cancer ER/PR positive HER-2/neu negative-currently off arimidex since dec 2016 [sec to SEs].  STABLE.   # Clinically no evidence of recurrence.  Await mammogram December 2020.  I reviewed the pathology/stage of cancer; given a copy of her pathology report.  # chronic joint pains- STABLE.  Continue vitamin D calcium and/chondroitin.  Vitamin D glucosamine chondroitin.   # Depression/bipolar-STABLE; zoloft/wellbutrin/Lamictal-better controlled  [Dr. Nicolasa Ducking.].   # OSTEOPENIA- DEC 2016- BMD -2.3;  On ca+vit D.  Reviewed the bone density.  Recommend a bone density along with a mammogram end of the December 2020.  Discussed regarding exercise; also other treatment options available.  Will call to discuss results.   # DISPOSITION: # 12 months-MD; labs-cbc/cmp/ca-27-29  Few days prior-Dr.B      Cammie Sickle, MD  08/28/2019 6:43 AM

## 2019-09-21 ENCOUNTER — Other Ambulatory Visit: Payer: BLUE CROSS/BLUE SHIELD

## 2019-09-22 ENCOUNTER — Other Ambulatory Visit: Payer: BLUE CROSS/BLUE SHIELD

## 2019-09-23 ENCOUNTER — Other Ambulatory Visit: Payer: BC Managed Care – PPO

## 2019-09-23 ENCOUNTER — Inpatient Hospital Stay: Admission: RE | Admit: 2019-09-23 | Payer: Self-pay | Source: Ambulatory Visit

## 2019-10-01 ENCOUNTER — Telehealth: Payer: Self-pay | Admitting: Internal Medicine

## 2019-10-01 DIAGNOSIS — I10 Essential (primary) hypertension: Secondary | ICD-10-CM

## 2019-10-01 MED ORDER — TRIAMTERENE-HCTZ 37.5-25 MG PO TABS: 1.0000 | ORAL_TABLET | Freq: Every day | ORAL | 3 refills | Status: DC

## 2019-10-01 NOTE — Telephone Encounter (Signed)
She should need norvasc 5 mg daily as well  Is she taking this for blood pressure as well?  Does she need refill if so please send #90 RF x 3    TMS

## 2019-10-01 NOTE — Telephone Encounter (Signed)
Refill has been placed.

## 2019-10-01 NOTE — Telephone Encounter (Signed)
Pt needs a refill on triamterene-hydrochlorothiazide (MAXZIDE-25) 37.5-25 MG tablet sent to [facility] Pt is out of medication

## 2019-10-05 ENCOUNTER — Other Ambulatory Visit: Payer: Self-pay

## 2019-10-05 DIAGNOSIS — I10 Essential (primary) hypertension: Secondary | ICD-10-CM

## 2019-10-05 MED ORDER — AMLODIPINE BESYLATE 5 MG PO TABS: 5.0000 mg | ORAL_TABLET | Freq: Every day | ORAL | 3 refills | Status: DC

## 2019-10-05 MED ORDER — TRIAMTERENE-HCTZ 37.5-25 MG PO TABS: 1.0000 | ORAL_TABLET | Freq: Every day | ORAL | 3 refills | Status: DC

## 2019-10-05 MED ORDER — TELMISARTAN 20 MG PO TABS: 20.0000 mg | ORAL_TABLET | Freq: Every day | ORAL | 3 refills | Status: DC

## 2019-10-05 NOTE — Telephone Encounter (Signed)
Patient has been taking all three bp medications. Refills were given. She will schedule virtual appointment soon

## 2019-11-12 DIAGNOSIS — F39 Unspecified mood [affective] disorder: Secondary | ICD-10-CM | POA: Diagnosis not present

## 2019-11-12 DIAGNOSIS — F411 Generalized anxiety disorder: Secondary | ICD-10-CM | POA: Diagnosis not present

## 2020-02-17 DIAGNOSIS — F39 Unspecified mood [affective] disorder: Secondary | ICD-10-CM | POA: Diagnosis not present

## 2020-02-17 DIAGNOSIS — F411 Generalized anxiety disorder: Secondary | ICD-10-CM | POA: Diagnosis not present

## 2020-04-29 ENCOUNTER — Ambulatory Visit
Admission: EM | Admit: 2020-04-29 | Discharge: 2020-04-29 | Disposition: A | Discharge status: Home / Self Care | Payer: BLUE CROSS/BLUE SHIELD | Attending: Family Medicine | Admitting: Family Medicine

## 2020-04-29 ENCOUNTER — Other Ambulatory Visit: Payer: Self-pay

## 2020-04-29 ENCOUNTER — Encounter: Payer: Self-pay | Admitting: Emergency Medicine

## 2020-04-29 DIAGNOSIS — J069 Acute upper respiratory infection, unspecified: Secondary | ICD-10-CM | POA: Insufficient documentation

## 2020-04-29 DIAGNOSIS — E7849 Other hyperlipidemia: Secondary | ICD-10-CM | POA: Insufficient documentation

## 2020-04-29 DIAGNOSIS — R7303 Prediabetes: Secondary | ICD-10-CM | POA: Diagnosis not present

## 2020-04-29 DIAGNOSIS — Z791 Long term (current) use of non-steroidal anti-inflammatories (NSAID): Secondary | ICD-10-CM | POA: Insufficient documentation

## 2020-04-29 DIAGNOSIS — R0981 Nasal congestion: Secondary | ICD-10-CM | POA: Diagnosis present

## 2020-04-29 DIAGNOSIS — Z6825 Body mass index (BMI) 25.0-25.9, adult: Secondary | ICD-10-CM | POA: Diagnosis not present

## 2020-04-29 DIAGNOSIS — Z7901 Long term (current) use of anticoagulants: Secondary | ICD-10-CM | POA: Insufficient documentation

## 2020-04-29 DIAGNOSIS — I10 Essential (primary) hypertension: Secondary | ICD-10-CM | POA: Insufficient documentation

## 2020-04-29 DIAGNOSIS — Z79899 Other long term (current) drug therapy: Secondary | ICD-10-CM | POA: Insufficient documentation

## 2020-04-29 DIAGNOSIS — E538 Deficiency of other specified B group vitamins: Secondary | ICD-10-CM | POA: Diagnosis not present

## 2020-04-29 DIAGNOSIS — E663 Overweight: Secondary | ICD-10-CM | POA: Insufficient documentation

## 2020-04-29 DIAGNOSIS — F329 Major depressive disorder, single episode, unspecified: Secondary | ICD-10-CM | POA: Insufficient documentation

## 2020-04-29 DIAGNOSIS — Z20822 Contact with and (suspected) exposure to covid-19: Secondary | ICD-10-CM | POA: Insufficient documentation

## 2020-04-29 NOTE — Discharge Instructions (Signed)
Self isolate until covid results are back and negative.  °Will notify you by phone of any positive findings. Your negative results will be sent through your MyChart.     ° °

## 2020-04-29 NOTE — ED Triage Notes (Signed)
Patient c/o runny nose and head congestion that started yesterday.  Patient would like to have a COVID test.  Patient denies fevers.

## 2020-04-29 NOTE — ED Provider Notes (Signed)
[facility]    CSN: [id number] Arrival date & time: 04/29/20  1556      History   Chief Complaint Chief Complaint  Patient presents with   Nasal Congestion    HPI Danielle Sparks is a 59 y.o. female.   Danielle Sparks presents with complaints of symptoms which started a few days ago- noted irritation to her throat. Congestion and sneezing started yesterday. No fever. Some cough, minimal today. Has been taking over the counter medications which have helped with symptoms. Feels facial pressure and congestion. No ear pain. No gi symptoms.  No known ill contacts. Has had covid vaccine series. Requesting covid-19 testing before she visits a friend who is of vulnerable population.    ROS per HPI, negative if not otherwise mentioned.      Past Medical History:  Diagnosis Date   Breast cancer ([place]) 09/2012   chemo and radiation right   Bunion    Cancer ([place])    breast   Depression    Hyperlipidemia    Hypertension    Other vitamin B12 deficiency anemia    Other vitamin B12 deficiency anemia    Personal history of chemotherapy    Personal history of radiation therapy    Postmenopausal atrophic vaginitis     Patient Active Problem List   Diagnosis Date Noted   Left wrist pain 01/20/2019   Hypokalemia 09/18/2018   OSA (obstructive sleep apnea) 05/22/2018   Cough 02/10/2018   Stress incontinence of urine 02/10/2018   Intertrigo 02/10/2018   Fatigue 02/10/2018   Prediabetes 02/10/2018   Chronic bilateral low back pain without sciatica 01/24/2017   Difficulty reading due to visual problem 01/16/2017   Carcinoma of overlapping sites of right breast in female, estrogen receptor positive ([place]) 07/30/2016   Tachycardia 06/28/2016   Overweight (BMI 25.0-29.9) 07/13/2015   Diaphoresis 06/30/2015   H/O: depression 05/09/2015   Breast cancer, right breast ([place]) 01/12/2013   Breast cancer, female ([place]) 01/12/2013   Hyperlipidemia with  target low density lipoprotein (LDL) cholesterol less than 100 mg/dL 05/13/2012   Menopausal disorder 05/13/2012   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      Home Medications    Prior to Admission medications   Medication Sig Start Date End Date Taking? Authorizing Provider  amLODipine (NORVASC) 5 MG tablet Take 1 tablet (5 mg total) by mouth daily. 10/05/19  Yes McLean-Scocuzza, Nino Glow, MD  buPROPion (WELLBUTRIN) 75 MG tablet Take 150 mg by mouth 2 (two) times daily.  03/25/13  Yes [provider]  calcium-vitamin D (OSCAL WITH D) 500-200 MG-UNIT tablet Take 1 tablet by mouth.   Yes [provider]  cholecalciferol (VITAMIN D) 400 units TABS tablet Take 400 Units by mouth.   Yes [provider]  cyanocobalamin (,VITAMIN B-12,) 1000  MCG/ML injection Inject 0.4 mLs into the muscle every 14 (fourteen) days.  08/12/19  Yes [provider]  lamoTRIgine (LAMICTAL) 100 MG tablet Take 1 tablet (100 mg total) by mouth daily. 09/18/18  Yes McLean-Scocuzza, Nino Glow, MD  omeprazole (PRILOSEC OTC) 20 MG tablet Take 20 mg by mouth daily.   Yes [provider]  sertraline (ZOLOFT) 100 MG tablet Take 150 mg by mouth daily.   Yes [provider]  telmisartan (MICARDIS) 20 MG tablet Take 1 tablet (20 mg total) by mouth  daily. In the am 10/05/19  Yes McLean-Scocuzza, Nino Glow, MD  triamterene-hydrochlorothiazide (MAXZIDE-25) 37.5-25 MG tablet Take 1 tablet by mouth daily. 10/05/19  Yes McLean-Scocuzza, Nino Glow, MD  atorvastatin (LIPITOR) 40 MG tablet Take 1 tablet (40 mg total) by mouth daily at 6 PM. 02/02/19   McLean-Scocuzza, Nino Glow, MD  clonazePAM Bobbye [place]) 0.5 MG tablet  09/02/14   [provider]  hydrocortisone 2.5 % cream Apply topically 2 (two) times daily. prn 01/23/19   McLean-Scocuzza, Nino Glow, MD  meloxicam (MOBIC) 7.5 MG tablet Take 1 tablet (7.5 mg total) by mouth daily as needed for pain. 03/19/19   McLean-Scocuzza, Nino Glow, MD    Family History Family History  Problem Relation Age of Onset   Heart disease Father    Skin cancer Father    Prostate cancer Father    COPD Father    Skin cancer Mother    Heart disease Mother        afib   Hyperlipidemia Mother    Breast cancer Paternal Aunt 18   Breast cancer Cousin 62   Hyperlipidemia Sister    Colon cancer Neg Hx    Rectal cancer Neg Hx    Stomach cancer Neg Hx     Social History Social History   Tobacco Use   Smoking status: Never Smoker   Smokeless tobacco: Never Used  Scientific laboratory technician Use: Never used  Substance Use Topics   Alcohol use: Yes    Comment: occasional glass of wine   Drug use: No     Allergies   Sulfa antibiotics and Xanax [alprazolam]   Review of Systems Review of Systems   Physical Exam Triage Vital Signs ED Triage Vitals  Enc Vitals Group     BP 04/29/20 1621 (!) 123/96     Pulse Rate 04/29/20 1621 79     Resp 04/29/20 1621 14     Temp 04/29/20 1621 98.2 F (36.8 C)     Temp Source 04/29/20 1621 Oral     SpO2 04/29/20 1621 98 %     Weight 04/29/20 1616 155 lb (70.3 kg)     Height 04/29/20 1616 5' 5.5" (1.664 m)     Head Circumference --      Peak Flow --      Pain Score 04/29/20 1615 0     Pain Loc --      Pain Edu? --      Excl. in [place]? --    No data  found.  Updated Vital Signs BP (!) 123/96 (BP Location: Left Arm)    Pulse 79    Temp 98.2 F (36.8 C) (Oral)    Resp 14    Ht 5' 5.5" (1.664 m)    Wt 155 lb (70.3 kg)    SpO2 98%    BMI 25.40 kg/m    Physical Exam Constitutional:      General: She is not in acute  distress.    Appearance: She is well-developed.  HENT:     Nose: Congestion present.     Mouth/Throat:     Mouth: Mucous membranes are moist.  Cardiovascular:     Rate and Rhythm: Normal rate.  Pulmonary:     Effort: Pulmonary effort is normal.  Skin:    General: Skin is warm and dry.  Neurological:     Mental Status: She is alert and oriented to person, place, and time.      UC Treatments / Results  Labs (all labs ordered are listed, but only abnormal results are displayed) Labs Reviewed  SARS CORONAVIRUS 2 (TAT 6-24 HRS)    EKG   Radiology No results found.  Procedures Procedures (including critical care time)  Medications Ordered in UC Medications - No data to display  Initial Impression / Assessment and Plan / UC Course  I have reviewed the triage vital signs and the nursing notes.  Pertinent labs & imaging results that were available during my care of the patient were reviewed by me and considered in my medical decision making (see chart for details).     Non toxic. Benign physical exam. No work of breathing. Over the counter medications have been helping with symptoms which she feels are consistent with common cold. covid testing collected and pending, will notify if positive. Return precautions provided. Patient verbalized understanding and agreeable to plan.   Final Clinical Impressions(s) / UC Diagnoses   Final diagnoses:  Upper respiratory tract infection, unspecified type     Discharge Instructions     Self isolate until covid results are back and negative.  Will notify you by phone of any positive findings. Your negative results will be sent through your MyChart.        ED  Prescriptions    None     PDMP not reviewed this encounter.   Zigmund Gottron, NP 04/29/20 1836

## 2020-04-30 LAB — SARS CORONAVIRUS 2 (TAT 6-24 HRS): SARS Coronavirus 2: NEGATIVE

## 2020-08-23 ENCOUNTER — Telehealth: Payer: Self-pay | Admitting: Internal Medicine

## 2020-08-23 NOTE — Telephone Encounter (Signed)
08/23/2020  Returned pt call; she left a VM b/c she was confused about dates and times of upcoming appts. Clarified appt for labs is 12/1 @ 8:15 and MD appt is 12/3 @ 8:45. Left call back # if she had any other questions  SRW

## 2020-08-24 ENCOUNTER — Inpatient Hospital Stay: Payer: BLUE CROSS/BLUE SHIELD | Attending: Internal Medicine

## 2020-08-24 ENCOUNTER — Other Ambulatory Visit: Payer: Self-pay

## 2020-08-24 DIAGNOSIS — C50811 Malignant neoplasm of overlapping sites of right female breast: Secondary | ICD-10-CM

## 2020-08-24 DIAGNOSIS — Z170 Estrogen receptor positive status [ER+]: Secondary | ICD-10-CM | POA: Insufficient documentation

## 2020-08-24 DIAGNOSIS — M8580 Other specified disorders of bone density and structure, unspecified site: Secondary | ICD-10-CM | POA: Insufficient documentation

## 2020-08-24 DIAGNOSIS — Z79899 Other long term (current) drug therapy: Secondary | ICD-10-CM | POA: Diagnosis not present

## 2020-08-24 DIAGNOSIS — Z853 Personal history of malignant neoplasm of breast: Secondary | ICD-10-CM | POA: Insufficient documentation

## 2020-08-24 LAB — COMPREHENSIVE METABOLIC PANEL
ALT: 22 U/L (ref 0–44)
AST: 21 U/L (ref 15–41)
Albumin: 4.9 g/dL (ref 3.5–5.0)
Alkaline Phosphatase: 56 U/L (ref 38–126)
Anion gap: 11 (ref 5–15)
BUN: 13 mg/dL (ref 8–23)
CO2: 27 mmol/L (ref 22–32)
Calcium: 9.8 mg/dL (ref 8.9–10.3)
Chloride: 100 mmol/L (ref 98–111)
Creatinine, Ser: 0.66 mg/dL (ref 0.44–1.00)
GFR, Estimated: >60 mL/min (ref >60)
Glucose, Bld: 89 mg/dL (ref 70–99)
Potassium: 3.7 mmol/L (ref 3.5–5.1)
Sodium: 138 mmol/L (ref 135–145)
Total Bilirubin: 1 mg/dL (ref 0.3–1.2)
Total Protein: 7.7 g/dL (ref 6.5–8.1)

## 2020-08-24 LAB — CBC WITH DIFFERENTIAL/PLATELET
Abs Immature Granulocytes: 0.01 10*3/uL (ref 0.00–0.07)
Basophils Absolute: 0 10*3/uL (ref 0.0–0.1)
Basophils Relative: 1 %
Eosinophils Absolute: 0.1 10*3/uL (ref 0.0–0.5)
Eosinophils Relative: 1 %
HCT: 38.8 % (ref 36.0–46.0)
Hemoglobin: 13.4 g/dL (ref 12.0–15.0)
Immature Granulocytes: 0 %
Lymphocytes Relative: 27 %
Lymphs Abs: 1.4 10*3/uL (ref 0.7–4.0)
MCH: 31.6 pg (ref 26.0–34.0)
MCHC: 34.5 g/dL (ref 30.0–36.0)
MCV: 91.5 fL (ref 80.0–100.0)
Monocytes Absolute: 0.4 10*3/uL (ref 0.1–1.0)
Monocytes Relative: 8 %
Neutro Abs: 3.3 10*3/uL (ref 1.7–7.7)
Neutrophils Relative %: 63 %
Platelets: 337 10*3/uL (ref 150–400)
RBC: 4.24 MIL/uL (ref 3.87–5.11)
RDW: 12.7 % (ref 11.5–15.5)
WBC: 5.3 10*3/uL (ref 4.0–10.5)
nRBC: 0 % (ref 0.0–0.2)

## 2020-08-25 LAB — CANCER ANTIGEN 27.29: CA 27.29: 15.1 U/mL (ref 0.0–38.6)

## 2020-08-26 ENCOUNTER — Inpatient Hospital Stay: Payer: BLUE CROSS/BLUE SHIELD | Admitting: Internal Medicine

## 2020-09-12 ENCOUNTER — Inpatient Hospital Stay: Payer: BLUE CROSS/BLUE SHIELD | Admitting: Internal Medicine

## 2020-09-14 ENCOUNTER — Encounter: Payer: Self-pay | Admitting: *Deleted

## 2020-09-15 NOTE — Telephone Encounter (Signed)
As per pt, pt wants to hold off appt with [place] given the upcoming change of insurance. GB

## 2020-10-07 ENCOUNTER — Telehealth: Payer: Self-pay | Admitting: *Deleted

## 2020-10-07 ENCOUNTER — Other Ambulatory Visit: Payer: Self-pay | Admitting: Internal Medicine

## 2020-10-07 ENCOUNTER — Other Ambulatory Visit: Payer: Self-pay | Admitting: *Deleted

## 2020-10-07 DIAGNOSIS — Z170 Estrogen receptor positive status [ER+]: Secondary | ICD-10-CM

## 2020-10-07 DIAGNOSIS — C50811 Malignant neoplasm of overlapping sites of right female breast: Secondary | ICD-10-CM

## 2020-10-07 NOTE — Telephone Encounter (Signed)
Patient called requesting an order for her mammogram be entered and to let her know it is entered so that she can call and schedule her mammogram

## 2020-10-07 NOTE — Telephone Encounter (Signed)
Screening Mammogram order entered. Patient has not had a mammo since 2019 due to issues with insurance. Danielle Sparks elected to have a virtual visit with Dr. Jacinto Reap on 2/14. Apts changed to virtual per her request. Danielle Sparks stated that Danielle Sparks would call [place] to schedule her apts. Danielle Sparks has multiple apts to work around and this is better for her to talk to the dept directly.

## 2020-10-25 ENCOUNTER — Other Ambulatory Visit: Payer: Self-pay

## 2020-10-25 ENCOUNTER — Encounter: Payer: Self-pay | Admitting: Internal Medicine

## 2020-10-25 ENCOUNTER — Ambulatory Visit (INDEPENDENT_AMBULATORY_CARE_PROVIDER_SITE_OTHER): Payer: 59 | Admitting: Internal Medicine

## 2020-10-25 VITALS — BP 140/90 | HR 67 | Temp 97.6°F | Ht 65.16 in | Wt 172.6 lb

## 2020-10-25 DIAGNOSIS — E785 Hyperlipidemia, unspecified: Secondary | ICD-10-CM

## 2020-10-25 DIAGNOSIS — R413 Other amnesia: Secondary | ICD-10-CM

## 2020-10-25 DIAGNOSIS — I10 Essential (primary) hypertension: Secondary | ICD-10-CM | POA: Diagnosis not present

## 2020-10-25 DIAGNOSIS — Z1329 Encounter for screening for other suspected endocrine disorder: Secondary | ICD-10-CM

## 2020-10-25 DIAGNOSIS — M25562 Pain in left knee: Secondary | ICD-10-CM | POA: Diagnosis not present

## 2020-10-25 DIAGNOSIS — G8929 Other chronic pain: Secondary | ICD-10-CM

## 2020-10-25 DIAGNOSIS — Z1389 Encounter for screening for other disorder: Secondary | ICD-10-CM

## 2020-10-25 DIAGNOSIS — F419 Anxiety disorder, unspecified: Secondary | ICD-10-CM

## 2020-10-25 DIAGNOSIS — Z0184 Encounter for antibody response examination: Secondary | ICD-10-CM

## 2020-10-25 DIAGNOSIS — Z0000 Encounter for general adult medical examination without abnormal findings: Secondary | ICD-10-CM | POA: Diagnosis not present

## 2020-10-25 DIAGNOSIS — F32A Depression, unspecified: Secondary | ICD-10-CM

## 2020-10-25 DIAGNOSIS — E2839 Other primary ovarian failure: Secondary | ICD-10-CM | POA: Diagnosis not present

## 2020-10-25 MED ORDER — TELMISARTAN 40 MG PO TABS: 40.0000 mg | ORAL_TABLET | Freq: Every day | ORAL | 3 refills | Status: DC

## 2020-10-25 NOTE — Progress Notes (Addendum)
Chief Complaint  Patient presents with  . Follow-up  . Knee Pain   Annual  1. HTN elevated on norvasc 5 mg qd, micardis 20 mg qd and maxzide 37.5-25 mg qd  2. Left knee pain tried chiropractor and w/o help tried mobic and voltaren gel had mis step 07/2020 overextended knee    Review of Systems  Constitutional: Negative for weight loss.  HENT: Negative for hearing loss.   Eyes: Negative for blurred vision.  Respiratory: Negative for shortness of breath.   Cardiovascular: Negative for chest pain.  Gastrointestinal: Negative for abdominal pain.  Musculoskeletal: Positive for joint pain.  Skin: Negative for rash.  Neurological: Negative for headaches.  Psychiatric/Behavioral: Negative for depression.   Past Medical History:  Diagnosis Date  . Breast cancer ([place]) 09/2012   chemo and radiation right  . Bunion   . Cancer ([place])    breast  . Depression   . Hyperlipidemia   . Hypertension   . Other vitamin B12 deficiency anemia   . Other vitamin B12 deficiency anemia   . Personal history of chemotherapy   . Personal history of radiation therapy   . Postmenopausal atrophic vaginitis    Past Surgical History:  Procedure Laterality Date  . ABDOMINAL HYSTERECTOMY    . BREAST BIOPSY Left 2002   stereotactic  . breast clip     left breast  . BREAST EXCISIONAL BIOPSY Right 2014   positive  . BUNIONECTOMY     bilateral  . GANGLION CYST EXCISION  01-09-10   Removed   . WISDOM TOOTH EXTRACTION     Family History  Problem Relation Age of Onset  . Heart disease Father   . Skin cancer Father   . Prostate cancer Father   . COPD Father   . Skin cancer Mother   . Heart disease Mother        afib  . Hyperlipidemia Mother   . Breast cancer Paternal Aunt 62  . Breast cancer Cousin 49  . Hyperlipidemia Sister   . Colon cancer Neg Hx   . Rectal cancer Neg Hx   . Stomach cancer Neg Hx    Social History   Socioeconomic History  . Marital status: Married    Spouse name: Not on  file  . Number of children: Not on file  . Years of education: Not on file  . Highest education level: Not on file  Occupational History  . Not on file  Tobacco Use  . Smoking status: Never Smoker  . Smokeless tobacco: Never Used  Vaping Use  . Vaping Use: Never used  Substance and Sexual Activity  . Alcohol use: Yes    Comment: occasional glass of wine  . Drug use: No  . Sexual activity: Not on file  Other Topics Concern  . 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 buPROPion (WELLBUTRIN) 75 MG tablet Take 150 mg by mouth 2 (two) times daily.   . calcium-vitamin D (OSCAL WITH D) 500-200 MG-UNIT tablet Take 1 tablet by mouth.  . cholecalciferol (VITAMIN D)  400 units TABS tablet Take 400 Units by mouth.  . Cyanocobalamin (B-12 PO) Take 400 mcg by mouth daily at 12 noon.  . hydrocortisone 2.5 % cream Apply topically 2 (two) times daily. prn  . lamoTRIgine (LAMICTAL) 100 MG tablet Take 1 tablet (100 mg total) by mouth daily.  Marland Kitchen omeprazole (PRILOSEC OTC) 20 MG tablet Take 20 mg by mouth daily.  . sertraline (ZOLOFT) 100 MG tablet Take 150 mg by mouth daily.  Marland Kitchen triamterene-hydrochlorothiazide (MAXZIDE-25) 37.5-25 MG tablet Take 1 tablet by mouth daily.  . [DISCONTINUED] telmisartan (MICARDIS) 20 MG tablet Take 1 tablet (20 mg total) by mouth daily. In the am   Allergies  Allergen Reactions  . Sulfa Antibiotics Itching    Rash under the skin  . Xanax [Alprazolam] Other (See Comments)    Night terrors   Recent Results (from the past 2160 hour(s))  Cancer antigen 27.29     Status: None   Collection Time: 08/24/20  8:08 AM   Result Value Ref Range   CA 27.29 15.1 0.0 - 38.6 U/mL    Comment: (NOTE) Siemens Centaur Immunochemiluminometric Methodology (ICMA) Values obtained with different assay methods or kits cannot be used interchangeably. Results cannot be interpreted as absolute evidence of the presence or absence of malignant disease. Performed At: [facility] [place], [place] [id number] Rush Farmer MD VO:[id number]   CBC with Differential     Status: None   Collection Time: 08/24/20  8:08 AM  Result Value Ref Range   WBC 5.3 4.0 - 10.5 K/uL   RBC 4.24 3.87 - 5.11 MIL/uL   Hemoglobin 13.4 12.0 - 15.0 g/dL   HCT 38.8 36.0 - 46.0 %   MCV 91.5 80.0 - 100.0 fL   MCH 31.6 26.0 - 34.0 pg   MCHC 34.5 30.0 - 36.0 g/dL   RDW 12.7 11.5 - 15.5 %   Platelets 337 150 - 400 K/uL   nRBC 0.0 0.0 - 0.2 %   Neutrophils Relative % 63 %   Neutro Abs 3.3 1.7 - 7.7 K/uL   Lymphocytes Relative 27 %   Lymphs Abs 1.4 0.7 - 4.0 K/uL   Monocytes Relative 8 %   Monocytes Absolute 0.4 0.1 - 1.0 K/uL   Eosinophils Relative 1 %   Eosinophils Absolute 0.1 0.0 - 0.5 K/uL   Basophils Relative 1 %   Basophils Absolute 0.0 0.0 - 0.1 K/uL   Immature Granulocytes 0 %   Abs Immature Granulocytes 0.01 0.00 - 0.07 K/uL    Comment: Performed at [facility], [address] [place], [place] [address]  Comprehensive metabolic panel     Status: None   Collection Time: 08/24/20  8:08 AM  Result Value Ref Range   Sodium 138 135 - 145 mmol/L   Potassium 3.7 3.5 - 5.1 mmol/L   Chloride 100 98 - 111 mmol/L   CO2 27 22 - 32 mmol/L   Glucose, Bld 89 70 - 99 mg/dL    Comment: Glucose reference range applies only to samples taken after fasting for at least 8 hours.   BUN 13 8 - 23 mg/dL   Creatinine, Ser 0.66 0.44 - 1.00 mg/dL   Calcium 9.8 8.9 - 10.3 mg/dL   Total Protein 7.7 6.5 - 8.1 g/dL   Albumin 4.9 3.5 - 5.0 g/dL   AST 21 15 - 41 U/L   ALT 22 0 - 44 U/L   Alkaline Phosphatase 56 38 - 126 U/L  Total Bilirubin 1.0 0.3 - 1.2 mg/dL   GFR, Estimated >60 >60 mL/min    Comment: (NOTE) Calculated using the CKD-EPI Creatinine Equation (2021)    Anion gap 11 5 - 15    Comment: Performed at [facility], [address] [place], [place] [address]   Objective  Body mass index is 28.58 kg/m. Wt Readings from Last 3 Encounters:  10/25/20 172 lb 9.6 oz (78.3 kg)  04/29/20 155 lb (70.3 kg)  08/27/19 146 lb (66.2 kg)   Temp Readings from Last 3 Encounters:  10/25/20 97.6 F (36.4 C) (Oral)  04/29/20 98.2 F (36.8 C) (Oral)  08/27/19 (!) 96.7 F (35.9 C) (Tympanic)   BP Readings from Last 3 Encounters:  10/25/20 140/90  04/29/20 (!) 123/96  08/27/19 (!) 147/83   Pulse Readings from Last 3 Encounters:  10/25/20 67  04/29/20 79  08/27/19 62    Physical Exam Vitals and nursing note reviewed.  Constitutional:      Appearance: Normal appearance. She is well-developed and well-groomed. She is obese.  HENT:     Head: Normocephalic and atraumatic.  Eyes:     Conjunctiva/sclera: Conjunctivae normal.     Pupils: Pupils are equal, round, and reactive to light.  Cardiovascular:     Rate and Rhythm: Normal rate and regular rhythm.     Heart sounds: Normal heart sounds. No murmur heard.   Pulmonary:     Effort: Pulmonary effort is normal.     Breath sounds: Normal breath sounds.  Skin:    General: Skin is warm and dry.  Neurological:     General: No focal deficit present.     Mental Status: She is alert and oriented to person, place, and time. Mental status is at baseline.     Gait: Gait normal.  Psychiatric:        Attention and Perception: Attention and perception normal.        Mood and Affect: Mood and affect normal.        Speech: Speech normal.        Behavior: Behavior normal. Behavior is cooperative.        Thought Content: Thought content normal.        Cognition and Memory: Cognition and memory normal.        Judgment: Judgment normal.      Assessment  Plan  Annual physical exam Had flu shotand pna 23 vx  Disc shingrix today  tdap had 05/13/12 Hep B2/3 today MMR immune  dexa 09/07/15 osteopenia need to reschedule DEXA for future ask H/o if they will schedule or want me to?  07/03/12 diverticulosis h/o hyperplastic polyp f/u in 10 years   S/p hysterectomy for DUB, fibroids pap neg 05/13/12 will ask who ob/gyn is at f/u  Dx mammo12/23/19 negativeh/o breast cancer rec screening in 1 year will ask Dr. Jacinto Reap will he order or should I?  -pt to schedule h/o ordered   H/o right breast cancer 5 years ago  Never smoker  Derm appt pt wants to see Dr. Kellie Moor or Nicole Kindred in the future given info  -will hold as of 10/25/20   rec healthy diet and exercise  Chronic pain of left knee - Plan: Ambulatory referral to Orthopedic Surgery Dr. Harlow Mares  Estrogen deficiency - Plan: DG Bone Density  Essential hypertension/HLD - Plan: telmisartan (MICARDIS) 40 MG tablet norvasc 5 mg qd and maxzide 37.5-25 mg qd  Has not been taking lipitor 40 mg qd   Short term  memory loss per Dr. Nicolasa Ducking  11/22/20 sent message  Because of her insurance, I cannot refer Ms. Kruszka to neuropsych testing in [place] at [facility]. Can you help me to do an internal referral to [facility] for neuropsych testing. It is impossible to do as an outside provider. She is having problems with short term memory that is worsening.    Provider: Dr. Olivia Mackie McLean-[facility] Medicine

## 2020-10-25 NOTE — Patient Instructions (Addendum)
Consider shingrix vaccine  Dr. Harlow Mares [facility]    Journal for Nurse Practitioners, 15(4), [phone]. Retrieved June 30, 2018 from http://clinicalkey.com/nursing">  Knee Exercises Ask your health care provider which exercises are safe for you. Do exercises exactly as told by your health care provider and adjust them as directed. It is normal to feel mild stretching, pulling, tightness, or discomfort as you do these exercises. Stop right away if you feel sudden pain or your pain gets worse. Do not begin these exercises until told by your health care provider. Stretching and range-of-motion exercises These exercises warm up your muscles and joints and improve the movement and flexibility of your knee. These exercises also help to relieve pain and swelling. Knee extension, prone 1. Lie on your abdomen (prone position) on a bed. 2. Place your left / right knee just beyond the edge of the surface so your knee is not on the bed. You can put a towel under your left / right thigh just above your kneecap for comfort. 3. Relax your leg muscles and allow gravity to straighten your knee (extension). You should feel a stretch behind your left / right knee. 4. Hold this position for __________ seconds. 5. Scoot up so your knee is supported between repetitions. Repeat __________ times. Complete this exercise __________ times a day. Knee flexion, active 1. Lie on your back with both legs straight. If this causes back discomfort, bend your left / right knee so your foot is flat on the floor. 2. Slowly slide your left / right heel back toward your buttocks. Stop when you feel a gentle stretch in the front of your knee or thigh (flexion). 3. Hold this position for __________ seconds. 4. Slowly slide your left / right heel back to the starting position. Repeat __________ times. Complete this exercise __________ times a day.   Quadriceps stretch, prone 1. Lie on your abdomen on a firm surface, such as a bed or  padded floor. 2. Bend your left / right knee and hold your ankle. If you cannot reach your ankle or pant leg, loop a belt around your foot and grab the belt instead. 3. Gently pull your heel toward your buttocks. Your knee should not slide out to the side. You should feel a stretch in the front of your thigh and knee (quadriceps). 4. Hold this position for __________ seconds. Repeat __________ times. Complete this exercise __________ times a day.   Hamstring, supine 1. Lie on your back (supine position). 2. Loop a belt or towel over the ball of your left / right foot. The ball of your foot is on the walking surface, right under your toes. 3. Straighten your left / right knee and slowly pull on the belt to raise your leg until you feel a gentle stretch behind your knee (hamstring). ? Do not let your knee bend while you do this. ? Keep your other leg flat on the floor. 4. Hold this position for __________ seconds. Repeat __________ times. Complete this exercise __________ times a day. Strengthening exercises These exercises build strength and endurance in your knee. Endurance is the ability to use your muscles for a long time, even after they get tired. Quadriceps, isometric This exercise stretches the muscles in front of your thigh (quadriceps) without moving your knee joint (isometric). 1. Lie on your back with your left / right leg extended and your other knee bent. Put a rolled towel or small pillow under your knee if told by your health care  provider. 2. Slowly tense the muscles in the front of your left / right thigh. You should see your kneecap slide up toward your hip or see increased dimpling just above the knee. This motion will push the back of the knee toward the floor. 3. For __________ seconds, hold the muscle as tight as you can without increasing your pain. 4. Relax the muscles slowly and completely. Repeat __________ times. Complete this exercise __________ times a day.    Straight leg raises This exercise stretches the muscles in front of your thigh (quadriceps) and the muscles that move your hips (hip flexors). 1. Lie on your back with your left / right leg extended and your other knee bent. 2. Tense the muscles in the front of your left / right thigh. You should see your kneecap slide up or see increased dimpling just above the knee. Your thigh may even shake a bit. 3. Keep these muscles tight as you raise your leg 4-6 inches (10-15 cm) off the floor. Do not let your knee bend. 4. Hold this position for __________ seconds. 5. Keep these muscles tense as you lower your leg. 6. Relax your muscles slowly and completely after each repetition. Repeat __________ times. Complete this exercise __________ times a day. Hamstring, isometric 1. Lie on your back on a firm surface. 2. Bend your left / right knee about __________ degrees. 3. Dig your left / right heel into the surface as if you are trying to pull it toward your buttocks. Tighten the muscles in the back of your thighs (hamstring) to "dig" as hard as you can without increasing any pain. 4. Hold this position for __________ seconds. 5. Release the tension gradually and allow your muscles to relax completely for __________ seconds after each repetition. Repeat __________ times. Complete this exercise __________ times a day. Hamstring curls If told by your health care provider, do this exercise while wearing ankle weights. Begin with __________ lb weights. Then increase the weight by 1 lb (0.5 kg) increments. Do not wear ankle weights that are more than __________ lb. 1. Lie on your abdomen with your legs straight. 2. Bend your left / right knee as far as you can without feeling pain. Keep your hips flat against the floor. 3. Hold this position for __________ seconds. 4. Slowly lower your leg to the starting position. Repeat __________ times. Complete this exercise __________ times a day.   Squats This  exercise strengthens the muscles in front of your thigh and knee (quadriceps). 1. Stand in front of a table, with your feet and knees pointing straight ahead. You may rest your hands on the table for balance but not for support. 2. Slowly bend your knees and lower your hips like you are going to sit in a chair. ? Keep your weight over your heels, not over your toes. ? Keep your lower legs upright so they are parallel with the table legs. ? Do not let your hips go lower than your knees. ? Do not bend lower than told by your health care provider. ? If your knee pain increases, do not bend as low. 3. Hold the squat position for __________ seconds. 4. Slowly push with your legs to return to standing. Do not use your hands to pull yourself to standing. Repeat __________ times. Complete this exercise __________ times a day. Wall slides This exercise strengthens the muscles in front of your thigh and knee (quadriceps). 1. Lean your back against a smooth wall or door, and  walk your feet out 18-24 inches (46-61 cm) from it. 2. Place your feet hip-width apart. 3. Slowly slide down the wall or door until your knees bend __________ degrees. Keep your knees over your heels, not over your toes. Keep your knees in line with your hips. 4. Hold this position for __________ seconds. Repeat __________ times. Complete this exercise __________ times a day.   Straight leg raises This exercise strengthens the muscles that rotate the leg at the hip and move it away from your body (hip abductors). 1. Lie on your side with your left / right leg in the top position. Lie so your head, shoulder, knee, and hip line up. You may bend your bottom knee to help you keep your balance. 2. Roll your hips slightly forward so your hips are stacked directly over each other and your left / right knee is facing forward. 3. Leading with your heel, lift your top leg 4-6 inches (10-15 cm). You should feel the muscles in your outer hip  lifting. ? Do not let your foot drift forward. ? Do not let your knee roll toward the ceiling. 4. Hold this position for __________ seconds. 5. Slowly return your leg to the starting position. 6. Let your muscles relax completely after each repetition. Repeat __________ times. Complete this exercise __________ times a day.   Straight leg raises This exercise stretches the muscles that move your hips away from the front of the pelvis (hip extensors). 1. Lie on your abdomen on a firm surface. You can put a pillow under your hips if that is more comfortable. 2. Tense the muscles in your buttocks and lift your left / right leg about 4-6 inches (10-15 cm). Keep your knee straight as you lift your leg. 3. Hold this position for __________ seconds. 4. Slowly lower your leg to the starting position. 5. Let your leg relax completely after each repetition. Repeat __________ times. Complete this exercise __________ times a day. This information is not intended to replace advice given to you by your health care provider. Make sure you discuss any questions you have with your health care provider. Document Revised: 07/01/2018 Document Reviewed: 07/01/2018 Elsevier Patient Education  2021 [place].  Zoster Vaccine, Recombinant injection What is this medicine? ZOSTER VACCINE (ZOS ter vak SEEN) is a vaccine used to reduce the risk of getting shingles. This vaccine is not used to treat shingles or nerve pain from shingles. This medicine may be used for other purposes; ask your health care provider or pharmacist if you have questions. COMMON BRAND NAME(S): [facility] What should I tell my health care provider before I take this medicine? They need to know if you have any of these conditions:  cancer  immune system problems  an unusual or allergic reaction to Zoster vaccine, other medications, foods, dyes, or preservatives  pregnant or trying to get pregnant  breast-feeding How should I use this  medicine? This vaccine is injected into a muscle. It is given by a health care provider. A copy of Vaccine Information Statements will be given before each vaccination. Be sure to read this information carefully each time. This sheet may change often. Talk to your health care provider about the use of this vaccine in children. This vaccine is not approved for use in children. Overdosage: If you think you have taken too much of this medicine contact a poison control center or emergency room at once. NOTE: This medicine is only for you. Do not share this medicine  with others. What if I miss a dose? Keep appointments for follow-up (booster) doses. It is important not to miss your dose. Call your health care provider if you are unable to keep an appointment. What may interact with this medicine?  medicines that suppress your immune system  medicines to treat cancer  steroid medicines like prednisone or cortisone This list may not describe all possible interactions. Give your health care provider a list of all the medicines, herbs, non-prescription drugs, or dietary supplements you use. Also tell them if you smoke, drink alcohol, or use illegal drugs. Some items may interact with your medicine. What should I watch for while using this medicine? Visit your health care provider regularly. This vaccine, like all vaccines, may not fully protect everyone. What side effects may I notice from receiving this medicine? Side effects that you should report to your doctor or health care professional as soon as possible:  allergic reactions (skin rash, itching or hives; swelling of the face, lips, or tongue)  trouble breathing Side effects that usually do not require medical attention (report these to your doctor or health care professional if they continue or are bothersome):  chills  headache  fever  nausea  pain, redness, or irritation at site where injected  tiredness  vomiting This list  may not describe all possible side effects. Call your doctor for medical advice about side effects. You may report side effects to FDA at 1-800-FDA-1088. Where should I keep my medicine? This vaccine is only given by a health care provider. It will not be stored at home. NOTE: This sheet is a summary. It may not cover all possible information. If you have questions about this medicine, talk to your doctor, pharmacist, or health care provider.  2021 Elsevier/Gold Standard (2019-10-16 16:23:07)

## 2020-10-26 ENCOUNTER — Encounter: Payer: Self-pay | Admitting: Internal Medicine

## 2020-10-26 ENCOUNTER — Other Ambulatory Visit (INDEPENDENT_AMBULATORY_CARE_PROVIDER_SITE_OTHER): Payer: 59

## 2020-10-26 ENCOUNTER — Other Ambulatory Visit: Payer: Self-pay | Admitting: Internal Medicine

## 2020-10-26 DIAGNOSIS — I10 Essential (primary) hypertension: Secondary | ICD-10-CM | POA: Diagnosis not present

## 2020-10-26 DIAGNOSIS — E538 Deficiency of other specified B group vitamins: Secondary | ICD-10-CM

## 2020-10-26 DIAGNOSIS — Z1389 Encounter for screening for other disorder: Secondary | ICD-10-CM | POA: Diagnosis not present

## 2020-10-26 DIAGNOSIS — Z1329 Encounter for screening for other suspected endocrine disorder: Secondary | ICD-10-CM | POA: Diagnosis not present

## 2020-10-26 DIAGNOSIS — E785 Hyperlipidemia, unspecified: Secondary | ICD-10-CM | POA: Diagnosis not present

## 2020-10-26 DIAGNOSIS — Z0184 Encounter for antibody response examination: Secondary | ICD-10-CM

## 2020-10-26 LAB — VITAMIN B12: Vitamin B-12: 657 pg/mL (ref 211–911)

## 2020-10-26 LAB — LIPID PANEL
Cholesterol: 273 mg/dL — ABNORMAL HIGH (ref 0–200)
HDL: 56.2 mg/dL (ref >39.00)
LDL Cholesterol: 200 mg/dL — ABNORMAL HIGH (ref 0–99)
NonHDL: 217.24
Total CHOL/HDL Ratio: 5
Triglycerides: 84 mg/dL (ref 0.0–149.0)
VLDL: 16.8 mg/dL (ref 0.0–40.0)

## 2020-10-26 LAB — TSH: TSH: 1.6 u[IU]/mL (ref 0.35–4.50)

## 2020-10-26 NOTE — Addendum Note (Signed)
Addended by: Tor Netters I on: 10/26/2020 10:45 AM   Modules accepted: Orders

## 2020-10-26 NOTE — Telephone Encounter (Signed)
Ok we will add it :)

## 2020-10-27 ENCOUNTER — Telehealth: Payer: Self-pay | Admitting: Internal Medicine

## 2020-10-27 LAB — URINALYSIS, ROUTINE W REFLEX MICROSCOPIC
Bilirubin, UA: NEGATIVE
Glucose, UA: NEGATIVE
Ketones, UA: NEGATIVE
Leukocytes,UA: NEGATIVE
Nitrite, UA: NEGATIVE
Protein,UA: NEGATIVE
RBC, UA: NEGATIVE
Specific Gravity, UA: 1.012 (ref 1.005–1.030)
Urobilinogen, Ur: 0.2 mg/dL (ref 0.2–1.0)
pH, UA: 7 (ref 5.0–7.5)

## 2020-10-27 LAB — HEPATITIS B SURFACE ANTIBODY, QUANTITATIVE: Hep B S AB Quant (Post): <5 m[IU]/mL — ABNORMAL LOW (ref >=10)

## 2020-10-27 NOTE — Telephone Encounter (Signed)
Pt received moderna on 11-03-2019, 12-02-2019 and booster on 07-07-2020

## 2020-10-28 ENCOUNTER — Other Ambulatory Visit: Payer: Self-pay

## 2020-10-28 DIAGNOSIS — E785 Hyperlipidemia, unspecified: Secondary | ICD-10-CM

## 2020-10-28 MED ORDER — ATORVASTATIN CALCIUM 40 MG PO TABS: 40.0000 mg | ORAL_TABLET | Freq: Every day | ORAL | 3 refills | Status: DC

## 2020-11-03 ENCOUNTER — Other Ambulatory Visit: Payer: Self-pay | Admitting: Internal Medicine

## 2020-11-03 DIAGNOSIS — I10 Essential (primary) hypertension: Secondary | ICD-10-CM

## 2020-11-07 ENCOUNTER — Inpatient Hospital Stay: Payer: 59 | Attending: Internal Medicine | Admitting: Internal Medicine

## 2020-11-07 DIAGNOSIS — C50811 Malignant neoplasm of overlapping sites of right female breast: Secondary | ICD-10-CM

## 2020-11-07 DIAGNOSIS — Z170 Estrogen receptor positive status [ER+]: Secondary | ICD-10-CM | POA: Diagnosis not present

## 2020-11-07 NOTE — Progress Notes (Signed)
I connected with Danielle Sparks on 11/07/20 at  2:45 PM EST by video enabled telemedicine visit and verified that I am speaking with the correct person using two identifiers.  I discussed the limitations, risks, security and privacy concerns of performing an evaluation and management service by telemedicine and the availability of in-person appointments. I also discussed with the patient that there may be a patient responsible charge related to this service. The patient expressed understanding and agreed to proceed.    Other persons participating in the visit and their role in the encounter: RN/medical reconciliation Patient's location: home Provider's location: office  Oncology History Overview Note  # FEB 2014- BREAST CANCER STAGE II [T1N1; 1.3cm 1/12LN pos; extranodal extension; Dr.Smith ]ER/PR-POS; Her 2 NEU-NEG. ; dd AC- Taxol weeklyx12 S/p RT; NOV 2014- Start Femara; Stop Dec 2016; MAY 2017- START Aromasin; STOPPED in [place] 2016 [joint pains]  # Chronic depression [Dr.Kapur]  # my RISK- NEG for deleterious mutation; VUS- BRIP1 gene   Breast cancer, right breast ([place])  01/12/2013 Initial Diagnosis   Breast cancer, right breast ([place])   Carcinoma of overlapping sites of right breast in female, estrogen receptor positive [facility])   Radiation Therapy    The patient saw No care team member to display for radiation treatment. This is the current list of radiation treatment: Radiation Treatments    Patient's record has no active or historical radiation treatments documented.          Chief Complaint: breast cancer  History of present illness:Danielle Sparks 60 y.o.  female with history of early stage breast cancer ER/PR positive is here for follow-up.  Patient is continued adjuvant AI in December 2016 because of joint pains.   Patient continues to have chronic joint pains chronic knee pain which she attributes to her arthritis.  She also has a trigger finger for which she is being  evaluated by Dr. Miller/orthopedics.  Denies any headaches but denies any new lumps or bumps.  She is awaiting mammogram/bone density this week.  Observation/objective: Alert & oriented x 3. In No acute distress.   Assessment and plan: Carcinoma of overlapping sites of right breast in female, estrogen receptor positive ([place]) # Stage II breast cancer ER/PR positive HER-2/neu negative-currently off arimidex since dec 2016 [sec to SEs].  STABLE.   # Clinically no evidence of recurrence; Mammogram Feb 15th 2022-pending.   # chronic joint pains- STABLE;  Continue vitamin D calcium and/chondroitin.  Vitamin D glucosamine chondroitin.   # Depression/bipolar-STABLE; zoloft/wellbutrin/Lamictal-better controlled  [Dr. Nicolasa Ducking.].   # OSTEOPENIA- DEC 2016- BMD -2.3;  On ca+vit D; DEXA scan- tomorrow [PCP]  # DISPOSITION: # 12 months-MD; labs-cbc/cmp/ca-27-29  Few days prior-Dr.B      Follow-up instructions:  I discussed the assessment and treatment plan with the patient.  The patient was provided an opportunity to ask questions and all were answered.  The patient agreed with the plan and demonstrated understanding of instructions.  The patient was advised to call back or seek an in person evaluation if the symptoms worsen or if the condition fails to improve as anticipated.   Dr. Charlaine Dalton [place] at [facility] 11/07/2020 3:07 PM

## 2020-11-07 NOTE — Assessment & Plan Note (Addendum)
#  Stage II breast cancer ER/PR positive HER-2/neu negative-currently off arimidex since dec 2016 [sec to SEs].  STABLE.   # Clinically no evidence of recurrence; Mammogram Feb 15th 2022-pending.   # chronic joint pains- STABLE;  Continue vitamin D calcium and/chondroitin.  Vitamin D glucosamine chondroitin.   # Depression/bipolar-STABLE; zoloft/wellbutrin/Lamictal-better controlled  [Dr. Nicolasa Ducking.].   # OSTEOPENIA- DEC 2016- BMD -2.3;  On ca+vit D; DEXA scan- tomorrow [PCP]  # DISPOSITION: # 12 months-MD; labs-cbc/cmp/ca-27-29  Few days prior-Dr.B

## 2020-11-09 ENCOUNTER — Other Ambulatory Visit: Payer: Self-pay

## 2020-11-09 ENCOUNTER — Ambulatory Visit
Admission: RE | Admit: 2020-11-09 | Discharge: 2020-11-09 | Disposition: A | Discharge status: Home / Self Care | Payer: 59 | Source: Ambulatory Visit | Attending: Internal Medicine | Admitting: Internal Medicine

## 2020-11-09 DIAGNOSIS — Z170 Estrogen receptor positive status [ER+]: Secondary | ICD-10-CM | POA: Insufficient documentation

## 2020-11-09 DIAGNOSIS — E2839 Other primary ovarian failure: Secondary | ICD-10-CM | POA: Insufficient documentation

## 2020-11-09 DIAGNOSIS — C50811 Malignant neoplasm of overlapping sites of right female breast: Secondary | ICD-10-CM | POA: Diagnosis present

## 2020-11-10 ENCOUNTER — Other Ambulatory Visit: Payer: Self-pay | Admitting: Internal Medicine

## 2020-11-10 DIAGNOSIS — L309 Dermatitis, unspecified: Secondary | ICD-10-CM

## 2020-11-10 DIAGNOSIS — W57XXXA Bitten or stung by nonvenomous insect and other nonvenomous arthropods, initial encounter: Secondary | ICD-10-CM

## 2020-11-23 ENCOUNTER — Encounter: Payer: Self-pay | Admitting: Internal Medicine

## 2020-11-23 DIAGNOSIS — R413 Other amnesia: Secondary | ICD-10-CM | POA: Insufficient documentation

## 2020-11-23 HISTORY — DX: Other amnesia: R41.3

## 2020-11-23 NOTE — Addendum Note (Signed)
Addended by: Orland Mustard on: 11/23/2020 05:37 PM   Modules accepted: Orders

## 2021-01-20 ENCOUNTER — Telehealth: Payer: Self-pay | Admitting: Internal Medicine

## 2021-01-20 NOTE — Telephone Encounter (Signed)
lft vm at [facility] neuropsy to follow up on referral. thanks

## 2021-01-24 NOTE — Telephone Encounter (Signed)
PT called to return phone call from earlier in regarding to the referral.

## 2021-03-22 ENCOUNTER — Other Ambulatory Visit: Payer: Self-pay | Admitting: Internal Medicine

## 2021-03-22 DIAGNOSIS — L309 Dermatitis, unspecified: Secondary | ICD-10-CM

## 2021-03-22 DIAGNOSIS — W57XXXA Bitten or stung by nonvenomous insect and other nonvenomous arthropods, initial encounter: Secondary | ICD-10-CM

## 2021-04-25 ENCOUNTER — Other Ambulatory Visit: Payer: Self-pay

## 2021-04-25 ENCOUNTER — Ambulatory Visit (INDEPENDENT_AMBULATORY_CARE_PROVIDER_SITE_OTHER): Payer: 59 | Admitting: Internal Medicine

## 2021-04-25 ENCOUNTER — Encounter: Payer: Self-pay | Admitting: Internal Medicine

## 2021-04-25 VITALS — BP 122/80 | HR 58 | Temp 97.7°F | Ht 65.16 in | Wt 169.0 lb

## 2021-04-25 DIAGNOSIS — M8580 Other specified disorders of bone density and structure, unspecified site: Secondary | ICD-10-CM

## 2021-04-25 DIAGNOSIS — I10 Essential (primary) hypertension: Secondary | ICD-10-CM | POA: Diagnosis not present

## 2021-04-25 DIAGNOSIS — E785 Hyperlipidemia, unspecified: Secondary | ICD-10-CM | POA: Diagnosis not present

## 2021-04-25 DIAGNOSIS — T148XXA Other injury of unspecified body region, initial encounter: Secondary | ICD-10-CM

## 2021-04-25 DIAGNOSIS — Z1283 Encounter for screening for malignant neoplasm of skin: Secondary | ICD-10-CM

## 2021-04-25 DIAGNOSIS — M1990 Unspecified osteoarthritis, unspecified site: Secondary | ICD-10-CM | POA: Diagnosis not present

## 2021-04-25 DIAGNOSIS — L989 Disorder of the skin and subcutaneous tissue, unspecified: Secondary | ICD-10-CM

## 2021-04-25 DIAGNOSIS — W57XXXA Bitten or stung by nonvenomous insect and other nonvenomous arthropods, initial encounter: Secondary | ICD-10-CM

## 2021-04-25 DIAGNOSIS — B009 Herpesviral infection, unspecified: Secondary | ICD-10-CM

## 2021-04-25 DIAGNOSIS — R7303 Prediabetes: Secondary | ICD-10-CM | POA: Diagnosis not present

## 2021-04-25 DIAGNOSIS — L304 Erythema intertrigo: Secondary | ICD-10-CM

## 2021-04-25 DIAGNOSIS — L700 Acne vulgaris: Secondary | ICD-10-CM

## 2021-04-25 DIAGNOSIS — Z0184 Encounter for antibody response examination: Secondary | ICD-10-CM

## 2021-04-25 DIAGNOSIS — L309 Dermatitis, unspecified: Secondary | ICD-10-CM

## 2021-04-25 LAB — COMPREHENSIVE METABOLIC PANEL
ALT: 14 U/L (ref 0–35)
AST: 14 U/L (ref 0–37)
Albumin: 4.8 g/dL (ref 3.5–5.2)
Alkaline Phosphatase: 56 U/L (ref 39–117)
BUN: 12 mg/dL (ref 6–23)
CO2: 30 mEq/L (ref 19–32)
Calcium: 9.9 mg/dL (ref 8.4–10.5)
Chloride: 98 mEq/L (ref 96–112)
Creatinine, Ser: 0.81 mg/dL (ref 0.40–1.20)
GFR: 77.94 mL/min (ref >60.00)
Glucose, Bld: 89 mg/dL (ref 70–99)
Potassium: 4.7 mEq/L (ref 3.5–5.1)
Sodium: 136 mEq/L (ref 135–145)
Total Bilirubin: 0.8 mg/dL (ref 0.2–1.2)
Total Protein: 6.7 g/dL (ref 6.0–8.3)

## 2021-04-25 LAB — CBC WITH DIFFERENTIAL/PLATELET
Basophils Absolute: 0 10*3/uL (ref 0.0–0.1)
Basophils Relative: 0.8 % (ref 0.0–3.0)
Eosinophils Absolute: 0.1 10*3/uL (ref 0.0–0.7)
Eosinophils Relative: 2.4 % (ref 0.0–5.0)
HCT: 38.5 % (ref 36.0–46.0)
Hemoglobin: 12.9 g/dL (ref 12.0–15.0)
Lymphocytes Relative: 22 % (ref 12.0–46.0)
Lymphs Abs: 1 10*3/uL (ref 0.7–4.0)
MCHC: 33.5 g/dL (ref 30.0–36.0)
MCV: 95.4 fl (ref 78.0–100.0)
Monocytes Absolute: 0.2 10*3/uL (ref 0.1–1.0)
Monocytes Relative: 5.3 % (ref 3.0–12.0)
Neutro Abs: 3.1 10*3/uL (ref 1.4–7.7)
Neutrophils Relative %: 69.5 % (ref 43.0–77.0)
Platelets: 258 10*3/uL (ref 150.0–400.0)
RBC: 4.03 Mil/uL (ref 3.87–5.11)
RDW: 13.8 % (ref 11.5–15.5)
WBC: 4.5 10*3/uL (ref 4.0–10.5)

## 2021-04-25 LAB — LIPID PANEL
Cholesterol: 272 mg/dL — ABNORMAL HIGH (ref 0–200)
HDL: 58.2 mg/dL (ref >39.00)
LDL Cholesterol: 190 mg/dL — ABNORMAL HIGH (ref 0–99)
NonHDL: 213.85
Total CHOL/HDL Ratio: 5
Triglycerides: 117 mg/dL (ref 0.0–149.0)
VLDL: 23.4 mg/dL (ref 0.0–40.0)

## 2021-04-25 LAB — HEMOGLOBIN A1C: Hgb A1c MFr Bld: 6 % (ref 4.6–6.5)

## 2021-04-25 MED ORDER — MUPIROCIN 2 % EX OINT: 1.0000 "application " | TOPICAL_OINTMENT | Freq: Two times a day (BID) | CUTANEOUS | 0 refills | Status: DC

## 2021-04-25 MED ORDER — NYSTATIN 100000 UNIT/GM EX CREA: 1.0000 "application " | TOPICAL_CREAM | Freq: Two times a day (BID) | CUTANEOUS | 11 refills | Status: DC

## 2021-04-25 MED ORDER — EZETIMIBE 10 MG PO TABS: 10.0000 mg | ORAL_TABLET | Freq: Every day | ORAL | 3 refills | Status: DC

## 2021-04-25 MED ORDER — AMLODIPINE BESYLATE 5 MG PO TABS: 5.0000 mg | ORAL_TABLET | Freq: Every day | ORAL | 3 refills | Status: DC

## 2021-04-25 MED ORDER — HYDROCORTISONE 2.5 % EX CREA: TOPICAL_CREAM | Freq: Two times a day (BID) | CUTANEOUS | 11 refills | Status: DC | PRN

## 2021-04-25 MED ORDER — VALACYCLOVIR HCL 500 MG PO TABS: 500.0000 mg | ORAL_TABLET | Freq: Two times a day (BID) | ORAL | 3 refills | Status: DC

## 2021-04-25 NOTE — Addendum Note (Signed)
Addended by: Orland Mustard on: 04/25/2021 10:08 AM   Modules accepted: Orders

## 2021-04-25 NOTE — Patient Instructions (Addendum)
Danielle Sparks made or wife tumeric  Bactine max lidocaine and antiseptic   Sarna anti itch lotion  Gold bond talc free powder   Psychologist for neurocognitive testing ask who is in network     Zoster Vaccine, Recombinant injection What is this medication? ZOSTER VACCINE (ZOS ter vak SEEN) is a vaccine used to reduce the risk of getting shingles. This vaccine is not used to treat shingles or nerve pain fromshingles. This medicine may be used for other purposes; ask your health care provider orpharmacist if you have questions. COMMON BRAND NAME(S): [facility] What should I tell my care team before I take this medication? They need to know if you have any of these conditions: cancer immune system problems an unusual or allergic reaction to Zoster vaccine, other medications, foods, dyes, or preservatives pregnant or trying to get pregnant breast-feeding How should I use this medication? This vaccine is injected into a muscle. It is given by a health care provider. A copy of Vaccine Information Statements will be given before each vaccination. Be sure to read this information carefully each time. This sheet may changeoften. Talk to your health care provider about the use of this vaccine in children.This vaccine is not approved for use in children. Overdosage: If you think you have taken too much of this medicine contact apoison control center or emergency room at once. NOTE: This medicine is only for you. Do not share this medicine with others. What if I miss a dose? Keep appointments for follow-up (booster) doses. It is important not to miss your dose. Call your health care provider if you are unable to keep anappointment. What may interact with this medication? medicines that suppress your immune system medicines to treat cancer steroid medicines like prednisone or cortisone This list may not describe all possible interactions. Give your health care provider a list of all the medicines, herbs,  non-prescription drugs, or dietary supplements you use. Also tell them if you smoke, drink alcohol, or use illegaldrugs. Some items may interact with your medicine. What should I watch for while using this medication? Visit your health care provider regularly. This vaccine, like all vaccines, may not fully protect everyone. What side effects may I notice from receiving this medication? Side effects that you should report to your doctor or health care professionalas soon as possible: allergic reactions (skin rash, itching or hives; swelling of the face, lips, or tongue) trouble breathing Side effects that usually do not require medical attention (report these toyour doctor or health care professional if they continue or are bothersome): chills headache fever nausea pain, redness, or irritation at site where injected tiredness vomiting This list may not describe all possible side effects. Call your doctor for medical advice about side effects. You may report side effects to FDA at1-800-FDA-1088. Where should I keep my medication? This vaccine is only given by a health care provider. It will not be stored athome. NOTE: This sheet is a summary. It may not cover all possible information. If you have questions about this medicine, talk to your doctor, pharmacist, orhealth care provider.  2022 Elsevier/Gold Standard (2019-10-16 16:23:07)

## 2021-04-25 NOTE — Progress Notes (Signed)
Chief Complaint  Patient presents with   Follow-up   F/u 1. Htn on norvasc 5 mg qd micardis 40 mg, maxzide 25-37.5 controlled HLD uncontrolled stopped lipitor 40  2. Abrasoin right knee h/o falls x 2  3. Osteoarthritis on robaxin 500 mg qd prn   Review of Systems  Constitutional:  Negative for weight loss.  HENT:  Negative for hearing loss.   Eyes:  Negative for blurred vision.  Respiratory:  Negative for shortness of breath.   Cardiovascular:  Negative for chest pain.  Gastrointestinal:  Negative for abdominal pain.  Musculoskeletal:  Positive for falls and joint pain.  Skin:  Negative for rash.  Neurological:  Negative for headaches.  Psychiatric/Behavioral:         Mood issues stable/controlled Dr. Nicolasa Ducking  Past Medical History:  Diagnosis Date   Breast cancer ([place]) 09/2012   chemo and radiation right   Bunion    Cancer ([place])    breast   Depression    Hyperlipidemia    Hypertension    Other vitamin B12 deficiency anemia    Other vitamin B12 deficiency anemia    Personal history of chemotherapy    Personal history of radiation therapy    Postmenopausal atrophic vaginitis    Shingles    Past Surgical History:  Procedure Laterality Date   ABDOMINAL HYSTERECTOMY     BREAST BIOPSY Left 2002   stereotactic   breast clip     left breast   BREAST EXCISIONAL BIOPSY Right 2014   positive   BUNIONECTOMY     bilateral   GANGLION CYST EXCISION  01-09-10   Removed    WISDOM TOOTH EXTRACTION     Family History  Problem Relation Age of Onset   Heart disease Father    Skin cancer Father    Prostate cancer Father    COPD Father    Skin cancer Mother    Heart disease Mother        afib   Hyperlipidemia Mother    Breast cancer Paternal Aunt 55   Breast cancer Cousin 99   Hyperlipidemia Sister    Colon cancer Neg Hx    Rectal cancer Neg Hx    Stomach cancer Neg Hx    Social History   Socioeconomic History   Marital status: Married    Spouse name: Not on file    Number of children: Not on file   Years of education: Not on file   Highest education level: Not on file  Occupational History   Not on file  Tobacco Use   Smoking status: Never   Smokeless tobacco: Never  Vaping Use   Vaping Use: Never used  Substance and Sexual Activity   Alcohol use: Yes    Comment: occasional glass of wine   Drug use: No   Sexual activity: Not on file  Other Topics Concern   Not on file  Social History Narrative   Married    2 kids    Used to own Public affairs consultant    Social Determinants of Health   Financial Resource Strain: Not on file  Food Insecurity: Not on file  Transportation Needs: Not on file  Physical Activity: Not on file  Stress: Not on file  Social Connections: Not on file  Intimate Partner Violence: Not on file   Current Meds  Medication Sig   buPROPion (WELLBUTRIN) 75 MG tablet Take 150 mg by mouth 2 (two) times daily.    calcium-vitamin D (OSCAL WITH  D) 500-200 MG-UNIT tablet Take 1 tablet by mouth.   cholecalciferol (VITAMIN D) 400 units TABS tablet Take 400 Units by mouth.   ezetimibe (ZETIA) 10 MG tablet Take 1 tablet (10 mg total) by mouth daily.   lamoTRIgine (LAMICTAL) 100 MG tablet Take 1 tablet (100 mg total) by mouth daily.   meloxicam (MOBIC) 7.5 MG tablet Take 1 tablet (7.5 mg total) by mouth daily as needed for pain.   methocarbamol (ROBAXIN) 500 MG tablet Take 500 mg by mouth daily as needed for muscle spasms.   mupirocin ointment (BACTROBAN) 2 % Apply 1 application topically 2 (two) times daily. Prn   nystatin cream (MYCOSTATIN) Apply 1 application topically 2 (two) times daily. Prn put on file for later   omeprazole (PRILOSEC OTC) 20 MG tablet Take 20 mg by mouth daily.   sertraline (ZOLOFT) 100 MG tablet Take 150 mg by mouth daily.   telmisartan (MICARDIS) 40 MG tablet Take 1 tablet (40 mg total) by mouth daily. In the am   triamterene-hydrochlorothiazide (MAXZIDE-25) 37.5-25 MG tablet TAKE 1 TABLET BY MOUTH ONCE  DAILY   valACYclovir (VALTREX) 500 MG tablet Take 1 tablet (500 mg total) by mouth 2 (two) times daily. X 3-7 days outbreak   [DISCONTINUED] amLODipine (NORVASC) 5 MG tablet Take 1 tablet (5 mg total) by mouth daily.   [DISCONTINUED] hydrocortisone 2.5 % cream APPLY TWICE DAILY AS NEEDED   Allergies  Allergen Reactions   Sulfa Antibiotics Itching    Rash under the skin   Xanax [Alprazolam] Other (See Comments)    Night terrors   No results found for this or any previous visit (from the past 2160 hour(s)). Objective  Body mass index is 27.99 kg/m. Wt Readings from Last 3 Encounters:  04/25/21 169 lb (76.7 kg)  10/25/20 172 lb 9.6 oz (78.3 kg)  04/29/20 155 lb (70.3 kg)   Temp Readings from Last 3 Encounters:  04/25/21 97.7 F (36.5 C) (Oral)  10/25/20 97.6 F (36.4 C) (Oral)  04/29/20 98.2 F (36.8 C) (Oral)   BP Readings from Last 3 Encounters:  04/25/21 122/80  10/25/20 140/90  04/29/20 (!) 123/96   Pulse Readings from Last 3 Encounters:  04/25/21 (!) 58  10/25/20 67  04/29/20 79    Physical Exam Vitals and nursing note reviewed.  Constitutional:      Appearance: Normal appearance. She is well-developed and well-groomed.  HENT:     Head: Normocephalic and atraumatic.  Eyes:     Conjunctiva/sclera: Conjunctivae normal.     Pupils: Pupils are equal, round, and reactive to light.  Cardiovascular:     Rate and Rhythm: Normal rate and regular rhythm.     Heart sounds: Normal heart sounds. No murmur heard. Abdominal:     Tenderness: There is no abdominal tenderness.  Skin:    General: Skin is warm and dry.  Neurological:     General: No focal deficit present.     Mental Status: She is alert and oriented to person, place, and time. Mental status is at baseline.     Gait: Gait normal.  Psychiatric:        Attention and Perception: Attention and perception normal.        Mood and Affect: Mood and affect normal.        Speech: Speech normal.        Behavior:  Behavior normal. Behavior is cooperative.        Thought Content: Thought content normal.  Cognition and Memory: Cognition and memory normal.        Judgment: Judgment normal.    Assessment  Plan  Essential hypertension controlled/HLD uncontrolled- Plan: amLODipine (NORVASC) 5 MG tablet  qd micardis 40 mg, maxzide 25-37.5  CBC with Differential/Platelet, Comprehensive metabolic panel, Lipid panel   Osteoarthritis, unspecified osteoarthritis type, unspecified site Prn mobic per ortho  Abrasion - Plan: mupirocin ointment (BACTROBAN) 2 %  Hyperlipidemia, unspecified hyperlipidemia type - Plan: Lipid panel, ezetimibe (ZETIA) 10 MG tablet Consider Dr. Debara Pickett in the future   Prediabetes - Plan: Hemoglobin A1c  Eczema, right breast - Plan: hydrocortisone 2.5 % cream, Ambulatory referral to Dermatology Get derm to check  Intertrigo - Plan: nystatin cream (MYCOSTATIN), Ambulatory referral to Dermatology  HSV infection - Plan: valACYclovir (VALTREX) 500 MG tablet bid prn 3-7 prn  Skin cancer screening - Plan: Ambulatory referral to Dermatology Acne vulgaris - Plan: Ambulatory referral to Dermatology Skin lesion - Plan: Ambulatory referral to Dermatology   HM Had flu shot and pna 23  vx Disc shingrix today given Rx 8/2/2  tdap had 05/13/12 Hep B 2/3 today check hep b status MMR immune   dexa 09/07/15 osteopenia need to reschedule DEXA for future ask H/o if they will schedule or want me to?   07/03/12 diverticulosis h/o hyperplastic polyp f/u in 10 years   S/p hysterectomy for DUB, fibroids pap neg 05/13/12 will ask who ob/gyn is at f/u  Dx mammo 09/15/18 negative h/o breast cancer rec screening in 1 year will ask Dr. B will he order or should I?  11/09/20 neg  Dexa 11/09/20 osteopenia    H/o right breast cancer 5 years ago    Never smoker    Derm appt pt wants to see Dr. Kellie Moor or Nicole Kindred in the future given info   -will hold as of 10/25/20   rec healthy diet and  exercise   Chronic pain of left knee - Plan: Ambulatory referral to Orthopedic Surgery Dr. Harlow Mares [facility] Dr .Nadara Mustard     Provider: Dr. Olivia Mackie McLean-Scocuzza-Internal Medicine

## 2021-04-26 NOTE — Addendum Note (Signed)
Addended by: Orland Mustard on: 04/26/2021 06:41 PM   Modules accepted: Orders

## 2021-05-31 ENCOUNTER — Ambulatory Visit: Payer: 59 | Admitting: Dermatology

## 2021-08-02 ENCOUNTER — Other Ambulatory Visit: Payer: Self-pay | Admitting: Internal Medicine

## 2021-08-02 DIAGNOSIS — I10 Essential (primary) hypertension: Secondary | ICD-10-CM

## 2021-08-24 ENCOUNTER — Telehealth: Payer: Self-pay | Admitting: Internal Medicine

## 2021-08-24 DIAGNOSIS — C50811 Malignant neoplasm of overlapping sites of right female breast: Secondary | ICD-10-CM

## 2021-08-24 DIAGNOSIS — Z170 Estrogen receptor positive status [ER+]: Secondary | ICD-10-CM

## 2021-08-24 NOTE — Telephone Encounter (Signed)
Order entered

## 2021-08-24 NOTE — Telephone Encounter (Signed)
Pt called to make her yearly appt. Please give her a call to schedule at [phone]

## 2021-08-24 NOTE — Addendum Note (Signed)
Addended by: Vanice Sarah on: 08/24/2021 12:25 PM   Modules accepted: Orders

## 2021-08-24 NOTE — Telephone Encounter (Signed)
Can you please order her mammo due in FEB?

## 2021-10-26 ENCOUNTER — Other Ambulatory Visit: Payer: Self-pay | Admitting: *Deleted

## 2021-10-26 ENCOUNTER — Telehealth: Payer: Self-pay | Admitting: Internal Medicine

## 2021-10-26 DIAGNOSIS — Z170 Estrogen receptor positive status [ER+]: Secondary | ICD-10-CM

## 2021-10-26 DIAGNOSIS — C50811 Malignant neoplasm of overlapping sites of right female breast: Secondary | ICD-10-CM

## 2021-10-26 NOTE — Telephone Encounter (Signed)
Received signed release from [facility].   Sent through interoffice communication to the medical records department.

## 2021-11-03 ENCOUNTER — Other Ambulatory Visit: Payer: Self-pay

## 2021-11-03 ENCOUNTER — Inpatient Hospital Stay: Payer: 59 | Attending: Internal Medicine

## 2021-11-03 DIAGNOSIS — Z9221 Personal history of antineoplastic chemotherapy: Secondary | ICD-10-CM | POA: Diagnosis not present

## 2021-11-03 DIAGNOSIS — F32A Depression, unspecified: Secondary | ICD-10-CM | POA: Diagnosis not present

## 2021-11-03 DIAGNOSIS — M8580 Other specified disorders of bone density and structure, unspecified site: Secondary | ICD-10-CM | POA: Insufficient documentation

## 2021-11-03 DIAGNOSIS — Z170 Estrogen receptor positive status [ER+]: Secondary | ICD-10-CM | POA: Diagnosis not present

## 2021-11-03 DIAGNOSIS — Z923 Personal history of irradiation: Secondary | ICD-10-CM | POA: Diagnosis not present

## 2021-11-03 DIAGNOSIS — C50811 Malignant neoplasm of overlapping sites of right female breast: Secondary | ICD-10-CM

## 2021-11-03 DIAGNOSIS — D519 Vitamin B12 deficiency anemia, unspecified: Secondary | ICD-10-CM | POA: Diagnosis not present

## 2021-11-03 DIAGNOSIS — Z79899 Other long term (current) drug therapy: Secondary | ICD-10-CM | POA: Insufficient documentation

## 2021-11-03 DIAGNOSIS — Z853 Personal history of malignant neoplasm of breast: Secondary | ICD-10-CM | POA: Diagnosis not present

## 2021-11-03 DIAGNOSIS — G8929 Other chronic pain: Secondary | ICD-10-CM | POA: Insufficient documentation

## 2021-11-03 DIAGNOSIS — R5383 Other fatigue: Secondary | ICD-10-CM | POA: Diagnosis not present

## 2021-11-03 DIAGNOSIS — M2550 Pain in unspecified joint: Secondary | ICD-10-CM | POA: Insufficient documentation

## 2021-11-03 DIAGNOSIS — R69 Illness, unspecified: Secondary | ICD-10-CM | POA: Diagnosis not present

## 2021-11-03 LAB — CBC WITH DIFFERENTIAL/PLATELET
Abs Immature Granulocytes: 0.02 10*3/uL (ref 0.00–0.07)
Basophils Absolute: 0 10*3/uL (ref 0.0–0.1)
Basophils Relative: 0 %
Eosinophils Absolute: 0.1 10*3/uL (ref 0.0–0.5)
Eosinophils Relative: 2 %
HCT: 36.7 % (ref 36.0–46.0)
Hemoglobin: 12.7 g/dL (ref 12.0–15.0)
Immature Granulocytes: 0 %
Lymphocytes Relative: 21 %
Lymphs Abs: 1 10*3/uL (ref 0.7–4.0)
MCH: 32.1 pg (ref 26.0–34.0)
MCHC: 34.6 g/dL (ref 30.0–36.0)
MCV: 92.7 fL (ref 80.0–100.0)
Monocytes Absolute: 0.3 10*3/uL (ref 0.1–1.0)
Monocytes Relative: 7 %
Neutro Abs: 3.4 10*3/uL (ref 1.7–7.7)
Neutrophils Relative %: 70 %
Platelets: 261 10*3/uL (ref 150–400)
RBC: 3.96 MIL/uL (ref 3.87–5.11)
RDW: 12.9 % (ref 11.5–15.5)
WBC: 4.9 10*3/uL (ref 4.0–10.5)
nRBC: 0 % (ref 0.0–0.2)

## 2021-11-03 LAB — COMPREHENSIVE METABOLIC PANEL
ALT: 17 U/L (ref 0–44)
AST: 20 U/L (ref 15–41)
Albumin: 4.3 g/dL (ref 3.5–5.0)
Alkaline Phosphatase: 63 U/L (ref 38–126)
Anion gap: 6 (ref 5–15)
BUN: 12 mg/dL (ref 8–23)
CO2: 31 mmol/L (ref 22–32)
Calcium: 9.2 mg/dL (ref 8.9–10.3)
Chloride: 94 mmol/L — ABNORMAL LOW (ref 98–111)
Creatinine, Ser: 0.79 mg/dL (ref 0.44–1.00)
GFR, Estimated: >60 mL/min (ref >60)
Glucose, Bld: 118 mg/dL — ABNORMAL HIGH (ref 70–99)
Potassium: 4.2 mmol/L (ref 3.5–5.1)
Sodium: 131 mmol/L — ABNORMAL LOW (ref 135–145)
Total Bilirubin: 0.3 mg/dL (ref 0.3–1.2)
Total Protein: 7.1 g/dL (ref 6.5–8.1)

## 2021-11-04 LAB — CANCER ANTIGEN 27.29: CA 27.29: 15.6 U/mL (ref 0.0–38.6)

## 2021-11-08 ENCOUNTER — Inpatient Hospital Stay (HOSPITAL_BASED_OUTPATIENT_CLINIC_OR_DEPARTMENT_OTHER): Payer: 59 | Admitting: Internal Medicine

## 2021-11-08 ENCOUNTER — Encounter: Payer: Self-pay | Admitting: Internal Medicine

## 2021-11-08 ENCOUNTER — Other Ambulatory Visit: Payer: Self-pay

## 2021-11-08 DIAGNOSIS — Z170 Estrogen receptor positive status [ER+]: Secondary | ICD-10-CM

## 2021-11-08 DIAGNOSIS — D519 Vitamin B12 deficiency anemia, unspecified: Secondary | ICD-10-CM | POA: Diagnosis not present

## 2021-11-08 DIAGNOSIS — M8580 Other specified disorders of bone density and structure, unspecified site: Secondary | ICD-10-CM | POA: Diagnosis not present

## 2021-11-08 DIAGNOSIS — Z853 Personal history of malignant neoplasm of breast: Secondary | ICD-10-CM | POA: Diagnosis not present

## 2021-11-08 DIAGNOSIS — M2550 Pain in unspecified joint: Secondary | ICD-10-CM | POA: Diagnosis not present

## 2021-11-08 DIAGNOSIS — C50811 Malignant neoplasm of overlapping sites of right female breast: Secondary | ICD-10-CM

## 2021-11-08 DIAGNOSIS — R5383 Other fatigue: Secondary | ICD-10-CM | POA: Diagnosis not present

## 2021-11-08 DIAGNOSIS — R69 Illness, unspecified: Secondary | ICD-10-CM | POA: Diagnosis not present

## 2021-11-08 DIAGNOSIS — Z9221 Personal history of antineoplastic chemotherapy: Secondary | ICD-10-CM | POA: Diagnosis not present

## 2021-11-08 DIAGNOSIS — Z923 Personal history of irradiation: Secondary | ICD-10-CM | POA: Diagnosis not present

## 2021-11-08 DIAGNOSIS — G8929 Other chronic pain: Secondary | ICD-10-CM | POA: Diagnosis not present

## 2021-11-08 NOTE — Assessment & Plan Note (Addendum)
#  Stage II breast cancer ER/PR positive HER-2/neu negative [[place]-2016]-currently off arimidex since dec 2016 [sec to SEs].   STABLE. Clinically no evidence of recurrence; however given the right breast pain-await diagnostic mammogram Feb 20th 2023-pending. Will order Dx. instead of screening mammogram.  # chronic joint pains-  Continue vitamin D calcium and/chondroitin.  Vitamin D glucosamine chondroitin.; STABLE.   # Depression/bipolar-STABLE; zoloft/wellbutrin/Lamictal-better controlled  [Dr. Kapur.]. STABLE  # OSTEOPENIA- DEC 2016- BMD -2.3;  On ca+vit D; DEXA scan- [PCP]; 2022- T-score of -1.8- STABLE.  I reviewed the bone density.  # DISPOSITION: # ORDER DIAGNOSTIC MAMMOGRAM [bil screenig is currently ordered]- Dx: Right breast pain/upper outer quadrant. # 12 months-MD; labs-cbc/cmp/ca-27-29  Few days prior-Dr.B

## 2021-11-08 NOTE — Progress Notes (Signed)
Pt states she is has been having rt breast tenderness x6 weeks.

## 2021-11-08 NOTE — Progress Notes (Signed)
[place] OFFICE PROGRESS NOTE  Patient Care Team: McLean-Scocuzza, Nino Glow, MD as PCP - General (Internal Medicine)   SUMMARY OF HEMATOLOGIC/ONCOLOGIC HISTORY:  Oncology History Overview Note  # FEB 2014- BREAST CANCER STAGE II [T1N1; 1.3cm 1/12LN pos; extranodal extension; Dr.Smith ]ER/PR-POS; Her 2 NEU-NEG. ; dd AC- Taxol weeklyx12 S/p RT; NOV 2014- Start Femara; Stop Dec 2016; MAY 2017- START Aromasin; STOPPED in [place] 2016 [joint pains]  # Chronic depression [Dr.Kapur]  # my RISK- NEG for deleterious mutation; VUS- BRIP1 gene   Breast cancer, right breast ([place])  01/12/2013 Initial Diagnosis   Breast cancer, right breast ([place])   Carcinoma of overlapping sites of right breast in female, estrogen receptor positive [facility])   Radiation Therapy    The patient saw No care team member to display for radiation treatment. This is the current list of radiation treatment: Radiation Treatments     Patient's record has no active or historical radiation treatments documented.          INTERVAL HISTORY:  A very pleasant 61 year old female patient with above history of stage II breast cancer 2014 currently on surveillance is here for follow-up.  Patient noted to have intermittent pain in the right breast upper outer quadrant.   Patient states her depression is well controlled.  She continues to follow-up with Dr. Jake Michaelis.  She has found a new job at [employer].  Patient states her depression is better controlled.  Mild to moderate fatigue.  Chronic joint pains.  Not any worse.  Review of Systems  Constitutional:  Negative for chills, diaphoresis, fever, malaise/fatigue and weight loss.  HENT:  Negative for nosebleeds and sore throat.   Eyes:  Negative for double vision.  Respiratory:  Negative for cough, hemoptysis, sputum production, shortness of breath and wheezing.   Cardiovascular:  Negative for chest pain, palpitations, orthopnea and leg swelling.   Gastrointestinal:  Negative for abdominal pain, blood in stool, constipation, diarrhea, heartburn, melena, nausea and vomiting.  Genitourinary:  Negative for dysuria, frequency and urgency.  Musculoskeletal:  Positive for back pain and joint pain.  Skin: Negative.  Negative for itching and rash.  Neurological:  Negative for dizziness, tingling, focal weakness, weakness and headaches.  Endo/Heme/Allergies:  Does not bruise/bleed easily.  Psychiatric/Behavioral:  Positive for depression. The patient is nervous/anxious. The patient does not have insomnia.     PAST MEDICAL HISTORY :  Past Medical History:  Diagnosis Date   Breast cancer ([place]) 09/2012   chemo and radiation right   Bunion    Cancer ([place])    breast   Depression    Hyperlipidemia    Hypertension    Other vitamin B12 deficiency anemia    Other vitamin B12 deficiency anemia    Personal history of chemotherapy    Personal history of radiation therapy    Postmenopausal atrophic vaginitis    Shingles     PAST SURGICAL HISTORY :   Past Surgical History:  Procedure Laterality Date   ABDOMINAL HYSTERECTOMY     BREAST BIOPSY Left 2002   stereotactic   breast clip     left breast   BREAST EXCISIONAL BIOPSY Right 2014   positive   BUNIONECTOMY     bilateral   GANGLION CYST EXCISION  01-09-10   Removed    WISDOM TOOTH EXTRACTION      FAMILY HISTORY :   Family History  Problem Relation Age of Onset   Heart disease Father    Skin cancer Father  Prostate cancer Father    COPD Father    Skin cancer Mother    Heart disease Mother        afib   Hyperlipidemia Mother    Breast cancer Paternal Aunt 51   Breast cancer Cousin 43   Hyperlipidemia Sister    Colon cancer Neg Hx    Rectal cancer Neg Hx    Stomach cancer Neg Hx     SOCIAL HISTORY:   Social History   Tobacco Use   Smoking status: Never   Smokeless tobacco: Never  Vaping Use   Vaping Use: Never used  Substance Use Topics   Alcohol use: Yes     Comment: occasional glass of wine   Drug use: No    ALLERGIES:  is allergic to sulfa antibiotics and xanax [alprazolam].  MEDICATIONS:  Current Outpatient Medications  Medication Sig Dispense Refill   amLODipine (NORVASC) 5 MG tablet Take 1 tablet (5 mg total) by mouth daily. 90 tablet 3   buPROPion (WELLBUTRIN) 75 MG tablet Take 150 mg by mouth 2 (two) times daily.      calcium-vitamin D (OSCAL WITH D) 500-200 MG-UNIT tablet Take 1 tablet by mouth.     cholecalciferol (VITAMIN D) 400 units TABS tablet Take 400 Units by mouth.     Cyanocobalamin (B-12 PO) Take 400 mcg by mouth daily at 12 noon.     ezetimibe (ZETIA) 10 MG tablet Take 1 tablet (10 mg total) by mouth daily. 90 tablet 3   hydrocortisone 2.5 % cream Apply topically 2 (two) times daily as needed. 28.35 g 11   lamoTRIgine (LAMICTAL) 100 MG tablet Take 1 tablet (100 mg total) by mouth daily.     meloxicam (MOBIC) 7.5 MG tablet Take 1 tablet (7.5 mg total) by mouth daily as needed for pain. 90 tablet 3   methocarbamol (ROBAXIN) 500 MG tablet Take 500 mg by mouth daily as needed for muscle spasms.     mupirocin ointment (BACTROBAN) 2 % Apply 1 application topically 2 (two) times daily. Prn 30 g 0   omeprazole (PRILOSEC OTC) 20 MG tablet Take 20 mg by mouth daily.     sertraline (ZOLOFT) 100 MG tablet Take 150 mg by mouth daily.     telmisartan (MICARDIS) 40 MG tablet TAKE ONE TABLET (40 MG) BY MOUTH EVERY MORNING 90 tablet 3   triamterene-hydrochlorothiazide (MAXZIDE-25) 37.5-25 MG tablet TAKE 1 TABLET BY MOUTH ONCE DAILY 90 tablet 3   No current facility-administered medications for this visit.    PHYSICAL EXAMINATION: ECOG PERFORMANCE STATUS: 0 - Asymptomatic  BP (!) 151/93 (BP Location: Left Arm, Patient Position: Sitting, Cuff Size: Normal)    Pulse 72    Temp (!) 97 F (36.1 C) (Oral)    Ht $R'5\' 6"'Zl$  (1.676 m)    Wt 173 lb 9.6 oz (78.7 kg)    SpO2 98%    BMI 28.02 kg/m   Filed Weights   11/08/21 1017  Weight: 173 lb 9.6  oz (78.7 kg)   Right and left BREAST exam [in the presence of nurse]-right breast exam no unusual skin changes; upper outer quadrant-vague mass felt.  Mild tenderness.  Surgical scars noted.  Left breast exam normal.   Physical Exam HENT:     Head: Normocephalic and atraumatic.     Mouth/Throat:     Pharynx: No oropharyngeal exudate.  Eyes:     Pupils: Pupils are equal, round, and reactive to light.  Cardiovascular:     Rate and  Rhythm: Normal rate and regular rhythm.  Pulmonary:     Effort: No respiratory distress.     Breath sounds: No wheezing.  Abdominal:     General: Bowel sounds are normal. There is no distension.     Palpations: Abdomen is soft. There is no mass.     Tenderness: There is no abdominal tenderness. There is no guarding or rebound.  Musculoskeletal:        General: No tenderness. Normal range of motion.     Cervical back: Normal range of motion and neck supple.  Skin:    General: Skin is warm.     Comments: Right and left BREAST exam [in the presence of nurse]- no unusual skin changes or dominant masses felt. Surgical scars noted.    Neurological:     Mental Status: She is alert and oriented to person, place, and time.  Psychiatric:        Mood and Affect: Affect normal.     LABORATORY DATA:  I have reviewed the data as listed    Component Value Date/Time   NA 131 (L) 11/03/2021 1108   NA 136 08/30/2014 1116   K 4.2 11/03/2021 1108   K 3.9 08/30/2014 1116   CL 94 (L) 11/03/2021 1108   CL 98 08/30/2014 1116   CO2 31 11/03/2021 1108   CO2 29 08/30/2014 1116   GLUCOSE 118 (H) 11/03/2021 1108   GLUCOSE 115 (H) 08/30/2014 1116   BUN 12 11/03/2021 1108   BUN 13 08/30/2014 1116   CREATININE 0.79 11/03/2021 1108   CREATININE 0.76 07/10/2018 0822   CALCIUM 9.2 11/03/2021 1108   CALCIUM 9.9 08/30/2014 1116   PROT 7.1 11/03/2021 1108   PROT 7.1 08/30/2014 1116   ALBUMIN 4.3 11/03/2021 1108   ALBUMIN 4.2 08/30/2014 1116   AST 20 11/03/2021 1108    AST 21 08/30/2014 1116   ALT 17 11/03/2021 1108   ALT 31 08/30/2014 1116   ALKPHOS 63 11/03/2021 1108   ALKPHOS 81 08/30/2014 1116   BILITOT 0.3 11/03/2021 1108   BILITOT 0.6 08/30/2014 1116   GFRNONAA >60 11/03/2021 1108   GFRNONAA >60 08/30/2014 1116   GFRNONAA >60 11/11/2013 1443   GFRAA >60 08/25/2019 0824   GFRAA >60 08/30/2014 1116   GFRAA >60 11/11/2013 1443    No results found for: SPEP, UPEP  Lab Results  Component Value Date   WBC 4.9 11/03/2021   NEUTROABS 3.4 11/03/2021   HGB 12.7 11/03/2021   HCT 36.7 11/03/2021   MCV 92.7 11/03/2021   PLT 261 11/03/2021      Chemistry      Component Value Date/Time   NA 131 (L) 11/03/2021 1108   NA 136 08/30/2014 1116   K 4.2 11/03/2021 1108   K 3.9 08/30/2014 1116   CL 94 (L) 11/03/2021 1108   CL 98 08/30/2014 1116   CO2 31 11/03/2021 1108   CO2 29 08/30/2014 1116   BUN 12 11/03/2021 1108   BUN 13 08/30/2014 1116   CREATININE 0.79 11/03/2021 1108   CREATININE 0.76 07/10/2018 0822      Component Value Date/Time   CALCIUM 9.2 11/03/2021 1108   CALCIUM 9.9 08/30/2014 1116   ALKPHOS 63 11/03/2021 1108   ALKPHOS 81 08/30/2014 1116   AST 20 11/03/2021 1108   AST 21 08/30/2014 1116   ALT 17 11/03/2021 1108   ALT 31 08/30/2014 1116   BILITOT 0.3 11/03/2021 1108   BILITOT 0.6 08/30/2014 1116  ASSESSMENT & PLAN:   Carcinoma of overlapping sites of right breast in female, estrogen receptor positive ([place]) # Stage II breast cancer ER/PR positive HER-2/neu negative [Dx-2016]-currently off arimidex since dec 2016 [sec to SEs].   STABLE. Clinically no evidence of recurrence; however given the right breast pain-await diagnostic mammogram Feb 20th 2023-pending. Will order Dx. instead of screening mammogram.  # chronic joint pains-  Continue vitamin D calcium and/chondroitin.  Vitamin D glucosamine chondroitin.; STABLE.   # Depression/bipolar-STABLE; zoloft/wellbutrin/Lamictal-better controlled  [Dr. Kapur.].  STABLE  # OSTEOPENIA- DEC 2016- BMD -2.3;  On ca+vit D; DEXA scan- [PCP]; 2022- T-score of -1.8- STABLE.  I reviewed the bone density.  # DISPOSITION: # ORDER DIAGNOSTIC MAMMOGRAM [bil screenig is currently ordered]- Dx: Right breast pain/upper outer quadrant. # 12 months-MD; labs-cbc/cmp/ca-27-29  Few days prior-Dr.B     Cammie Sickle, MD 11/08/2021 11:20 AM

## 2021-11-13 ENCOUNTER — Ambulatory Visit: Payer: 59

## 2021-11-21 DIAGNOSIS — R69 Illness, unspecified: Secondary | ICD-10-CM | POA: Diagnosis not present

## 2021-11-21 DIAGNOSIS — F411 Generalized anxiety disorder: Secondary | ICD-10-CM | POA: Diagnosis not present

## 2021-11-22 ENCOUNTER — Ambulatory Visit
Admission: RE | Admit: 2021-11-22 | Discharge: 2021-11-22 | Disposition: A | Discharge status: Home / Self Care | Payer: 59 | Source: Ambulatory Visit | Attending: Internal Medicine | Admitting: Internal Medicine

## 2021-11-22 ENCOUNTER — Other Ambulatory Visit: Payer: Self-pay

## 2021-11-22 DIAGNOSIS — Z170 Estrogen receptor positive status [ER+]: Secondary | ICD-10-CM | POA: Insufficient documentation

## 2021-11-22 DIAGNOSIS — C50811 Malignant neoplasm of overlapping sites of right female breast: Secondary | ICD-10-CM | POA: Diagnosis not present

## 2021-11-22 DIAGNOSIS — R922 Inconclusive mammogram: Secondary | ICD-10-CM | POA: Diagnosis not present

## 2021-11-22 DIAGNOSIS — Z853 Personal history of malignant neoplasm of breast: Secondary | ICD-10-CM | POA: Diagnosis not present

## 2021-12-25 ENCOUNTER — Encounter: Payer: Self-pay | Admitting: Internal Medicine

## 2021-12-27 ENCOUNTER — Encounter: Payer: Self-pay | Admitting: Internal Medicine

## 2021-12-27 ENCOUNTER — Ambulatory Visit (INDEPENDENT_AMBULATORY_CARE_PROVIDER_SITE_OTHER): Payer: 59 | Admitting: Internal Medicine

## 2021-12-27 VITALS — BP 122/84 | HR 71 | Temp 98.2°F | Resp 14 | Ht 66.0 in | Wt 174.2 lb

## 2021-12-27 DIAGNOSIS — Z1389 Encounter for screening for other disorder: Secondary | ICD-10-CM

## 2021-12-27 DIAGNOSIS — R7303 Prediabetes: Secondary | ICD-10-CM | POA: Diagnosis not present

## 2021-12-27 DIAGNOSIS — E669 Obesity, unspecified: Secondary | ICD-10-CM | POA: Diagnosis not present

## 2021-12-27 DIAGNOSIS — I10 Essential (primary) hypertension: Secondary | ICD-10-CM | POA: Diagnosis not present

## 2021-12-27 DIAGNOSIS — R635 Abnormal weight gain: Secondary | ICD-10-CM | POA: Diagnosis not present

## 2021-12-27 DIAGNOSIS — T148XXA Other injury of unspecified body region, initial encounter: Secondary | ICD-10-CM

## 2021-12-27 DIAGNOSIS — E785 Hyperlipidemia, unspecified: Secondary | ICD-10-CM | POA: Diagnosis not present

## 2021-12-27 DIAGNOSIS — Z23 Encounter for immunization: Secondary | ICD-10-CM

## 2021-12-27 DIAGNOSIS — Z0000 Encounter for general adult medical examination without abnormal findings: Secondary | ICD-10-CM | POA: Diagnosis not present

## 2021-12-27 LAB — LIPID PANEL
Cholesterol: 262 mg/dL — ABNORMAL HIGH (ref 0–200)
HDL: 62.1 mg/dL (ref >39.00)
LDL Cholesterol: 181 mg/dL — ABNORMAL HIGH (ref 0–99)
NonHDL: 199.62
Total CHOL/HDL Ratio: 4
Triglycerides: 91 mg/dL (ref 0.0–149.0)
VLDL: 18.2 mg/dL (ref 0.0–40.0)

## 2021-12-27 LAB — HEMOGLOBIN A1C: Hgb A1c MFr Bld: 6.1 % (ref 4.6–6.5)

## 2021-12-27 LAB — BASIC METABOLIC PANEL
BUN: 15 mg/dL (ref 6–23)
CO2: 28 mEq/L (ref 19–32)
Calcium: 10.2 mg/dL (ref 8.4–10.5)
Chloride: 97 mEq/L (ref 96–112)
Creatinine, Ser: 0.9 mg/dL (ref 0.40–1.20)
GFR: 68.36 mL/min (ref >60.00)
Glucose, Bld: 84 mg/dL (ref 70–99)
Potassium: 4.5 mEq/L (ref 3.5–5.1)
Sodium: 136 mEq/L (ref 135–145)

## 2021-12-27 LAB — TSH: TSH: 1.11 u[IU]/mL (ref 0.35–5.50)

## 2021-12-27 MED ORDER — AMLODIPINE BESYLATE 5 MG PO TABS: 5.0000 mg | ORAL_TABLET | Freq: Every day | ORAL | 3 refills | Status: DC

## 2021-12-27 MED ORDER — WEGOVY 0.5 MG/0.5ML ~~LOC~~ SOAJ: 0.5000 mg | SUBCUTANEOUS | 0 refills | Status: DC

## 2021-12-27 MED ORDER — EZETIMIBE 10 MG PO TABS: 10.0000 mg | ORAL_TABLET | Freq: Every day | ORAL | 3 refills | Status: DC

## 2021-12-27 MED ORDER — PHENTERMINE HCL 37.5 MG PO TABS: 37.5000 mg | ORAL_TABLET | Freq: Every day | ORAL | 0 refills | Status: DC

## 2021-12-27 MED ORDER — WEGOVY 2.4 MG/0.75ML ~~LOC~~ SOAJ: 2.4000 mg | SUBCUTANEOUS | 0 refills | Status: DC

## 2021-12-27 MED ORDER — WEGOVY 1.7 MG/0.75ML ~~LOC~~ SOAJ: 1.7000 mg | SUBCUTANEOUS | 0 refills | Status: DC

## 2021-12-27 MED ORDER — WEGOVY 0.25 MG/0.5ML ~~LOC~~ SOAJ: 0.2500 mg | SUBCUTANEOUS | 0 refills | Status: DC

## 2021-12-27 MED ORDER — MUPIROCIN 2 % EX OINT: 1.0000 "application " | TOPICAL_OINTMENT | Freq: Two times a day (BID) | CUTANEOUS | 0 refills | Status: AC

## 2021-12-27 MED ORDER — WEGOVY 1 MG/0.5ML ~~LOC~~ SOAJ: 1.0000 mg | SUBCUTANEOUS | 0 refills | Status: DC

## 2021-12-27 NOTE — Progress Notes (Addendum)
Chief Complaint  ?Patient presents with  ? Acute Visit  ?  Discuss wt loss treatment, no other concerns.   ? Annual Exam  ? ?Annual  ?1. Htn controlled  ?2. Obesity/hld prediaebtes wants to disc wt loss she is binge eating but also trying fasting x 16 hrs has tried adipex in 2012, 2013, 2016 did not try saxenda in the past  ? ? ?ROS ?Past Medical History:  ?Diagnosis Date  ? Breast cancer ([place]) 09/2012  ? chemo and radiation right  ? Bunion   ? Cancer [facility])   ? breast  ? Depression   ? Hyperlipidemia   ? Hypertension   ? Other vitamin B12 deficiency anemia   ? Other vitamin B12 deficiency anemia   ? Personal history of chemotherapy   ? Personal history of radiation therapy   ? Postmenopausal atrophic vaginitis   ? Shingles   ? right flank  ? ?Past Surgical History:  ?Procedure Laterality Date  ? ABDOMINAL HYSTERECTOMY    ? BREAST BIOPSY Left 2002  ? stereotactic  ? breast clip    ? left breast  ? BREAST EXCISIONAL BIOPSY Right 2014  ? positive  ? BUNIONECTOMY    ? bilateral  ? GANGLION CYST EXCISION  01-09-10  ? Removed   ? WISDOM TOOTH EXTRACTION    ? ?Family History  ?Problem Relation Age of Onset  ? Heart disease Father   ? Skin cancer Father   ? Prostate cancer Father   ? COPD Father   ? Skin cancer Mother   ? Heart disease Mother   ?     afib  ? Hyperlipidemia Mother   ? Breast cancer Paternal Aunt 82  ? Breast cancer Cousin 66  ? Hyperlipidemia Sister   ? Colon cancer Neg Hx   ? Rectal cancer Neg Hx   ? Stomach cancer Neg Hx   ? ?Social History  ? ?Socioeconomic History  ? Marital status: Married  ?  Spouse name: Not on file  ? Number of children: Not on file  ? Years of education: Not on file  ? Highest education level: Not on file  ?Occupational History  ? Not on file  ?Tobacco Use  ? Smoking status: Never  ? Smokeless tobacco: Never  ?Vaping Use  ? Vaping Use: Never used  ?Substance and Sexual Activity  ? Alcohol use: Yes  ?  Comment: occasional glass of wine  ? Drug use: No  ? Sexual activity: Not on file   ?Other Topics Concern  ? Not on file  ?Social History Narrative  ? Married   ? 2 kids   ? 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 ?Social Determinants of Health  ? ?Financial Resource Strain: Not on file  ?Food Insecurity: Not on file  ?Transportation Needs: Not on file  ?Physical Activity: Not on file  ?Stress: Not on file  ?Social Connections: Not on file  ?Intimate Partner Violence: Not on file  ? ?Current Meds  ?Medication Sig  ? buPROPion (WELLBUTRIN) 75 MG tablet Take 150 mg by mouth 2 (two) times daily.   ? calcium-vitamin D (OSCAL WITH D) 500-200 MG-UNIT tablet Take 1 tablet by mouth.  ? cholecalciferol (VITAMIN D) 400 units TABS tablet Take 400 Units by mouth.  ? Cyanocobalamin (B-12 PO) Take 400 mcg by mouth daily at 12 noon.  ? hydrocortisone 2.5 % cream Apply topically 2 (two) times daily as needed.  ? lamoTRIgine (LAMICTAL) 100 MG tablet Take 1 tablet (  100 mg total) by mouth daily.  ? meloxicam (MOBIC) 7.5 MG tablet Take 1 tablet (7.5 mg total) by mouth daily as needed for pain.  ? methocarbamol (ROBAXIN) 500 MG tablet Take 500 mg by mouth daily as needed for muscle spasms.  ? mupirocin ointment (BACTROBAN) 2 % Apply 1 application. topically 2 (two) times daily. Prn  ? omeprazole (PRILOSEC OTC) 20 MG tablet Take 20 mg by mouth daily.  ? Semaglutide-Weight Management (WEGOVY) 0.25 MG/0.5ML SOAJ Inject 0.25 mg into the skin once a week.  ? Semaglutide-Weight Management (WEGOVY) 0.5 MG/0.5ML SOAJ Inject 0.5 mg into the skin once a week.  ? Semaglutide-Weight Management (WEGOVY) 1 MG/0.5ML SOAJ Inject 1 mg into the skin once a week.  ? Semaglutide-Weight Management (WEGOVY) 1.7 MG/0.75ML SOAJ Inject 1.7 mg into the skin once a week.  ? Semaglutide-Weight Management (WEGOVY) 2.4 MG/0.75ML SOAJ Inject 2.4 mg into the skin once a week.  ? sertraline (ZOLOFT) 100 MG tablet Take 150 mg by mouth daily.  ? telmisartan (MICARDIS) 40 MG tablet TAKE ONE TABLET (40 MG) BY MOUTH EVERY MORNING  ?  triamterene-hydrochlorothiazide (MAXZIDE-25) 37.5-25 MG tablet TAKE 1 TABLET BY MOUTH ONCE DAILY  ? [DISCONTINUED] amLODipine (NORVASC) 5 MG tablet Take 1 tablet (5 mg total) by mouth daily.  ? [DISCONTINUED] ezetimibe (ZETIA) 10 MG tablet Take 1 tablet (10 mg total) by mouth daily.  ? [DISCONTINUED] mupirocin ointment (BACTROBAN) 2 % Apply 1 application topically 2 (two) times daily. Prn  ? ?Allergies  ?Allergen Reactions  ? Sulfa Antibiotics Itching  ?  Rash under the skin  ? Xanax [Alprazolam] Other (See Comments)  ?  Night terrors  ? ?Recent Results (from the past 2160 hour(s))  ?CBC with Differential     Status: None  ? Collection Time: 11/03/21 11:08 AM  ?Result Value Ref Range  ? WBC 4.9 4.0 - 10.5 K/uL  ? RBC 3.96 3.87 - 5.11 MIL/uL  ? Hemoglobin 12.7 12.0 - 15.0 g/dL  ? HCT 36.7 36.0 - 46.0 %  ? MCV 92.7 80.0 - 100.0 fL  ? MCH 32.1 26.0 - 34.0 pg  ? MCHC 34.6 30.0 - 36.0 g/dL  ? RDW 12.9 11.5 - 15.5 %  ? Platelets 261 150 - 400 K/uL  ? nRBC 0.0 0.0 - 0.2 %  ? Neutrophils Relative % 70 %  ? Neutro Abs 3.4 1.7 - 7.7 K/uL  ? Lymphocytes Relative 21 %  ? Lymphs Abs 1.0 0.7 - 4.0 K/uL  ? Monocytes Relative 7 %  ? Monocytes Absolute 0.3 0.1 - 1.0 K/uL  ? Eosinophils Relative 2 %  ? Eosinophils Absolute 0.1 0.0 - 0.5 K/uL  ? Basophils Relative 0 %  ? Basophils Absolute 0.0 0.0 - 0.1 K/uL  ? Immature Granulocytes 0 %  ? Abs Immature Granulocytes 0.02 0.00 - 0.07 K/uL  ?  Comment: Performed at [facility], [address] [place], [place] [address]  ?Comprehensive metabolic panel     Status: Abnormal  ? Collection Time: 11/03/21 11:08 AM  ?Result Value Ref Range  ? Sodium 131 (L) 135 - 145 mmol/L  ? Potassium 4.2 3.5 - 5.1 mmol/L  ? Chloride 94 (L) 98 - 111 mmol/L  ? CO2 31 22 - 32 mmol/L  ? Glucose, Bld 118 (H) 70 - 99 mg/dL  ?  Comment: Glucose reference range applies only to samples taken after fasting for at least 8 hours.  ? BUN 12 8 - 23 mg/dL  ? Creatinine, Ser  0.79 0.44 - 1.00 mg/dL  ? Calcium 9.2  8.9 - 10.3 mg/dL  ? Total Protein 7.1 6.5 - 8.1 g/dL  ? Albumin 4.3 3.5 - 5.0 g/dL  ? AST 20 15 - 41 U/L  ? ALT 17 0 - 44 U/L  ? Alkaline Phosphatase 63 38 - 126 U/L  ? Total Bilirubin 0.3 0.3 - 1.2 mg/dL  ? GFR, Estimated >60 >60 mL/min  ?  Comment: (NOTE) ?Calculated using the CKD-EPI Creatinine Equation (2021) ?  ? Anion gap 6 5 - 15  ?  Comment: Performed at [facility] [address] [place], [place] [address]  ?Cancer antigen 27.29     Status: None  ? Collection Time: 11/03/21 11:08 AM  ?Result Value Ref Range  ? CA 27.29 15.6 0.0 - 38.6 U/mL  ?  Comment: (NOTE) ?[employer] Academic librarian [facility]) ?Values obtained with different assay methods or kits cannot be used ?interchangeably. Results cannot be interpreted as absolute evidence ?of the presence or absence of malignant disease. ?Performed At: [place] ?[address] [place], [place] [id number] ?Rush Farmer MD [id number] ?  ? ?Objective  ?Body mass index is 28.12 kg/m?. ?Wt Readings from Last 3 Encounters:  ?12/27/21 174 lb 3.2 oz (79 kg)  ?11/08/21 173 lb 9.6 oz (78.7 kg)  ?04/25/21 169 lb (76.7 kg)  ? ?Temp Readings from Last 3 Encounters:  ?12/27/21 98.2 ?F (36.8 ?C) (Oral)  ?11/08/21 (!) 97 ?F (36.1 ?C) (Oral)  ?04/25/21 97.7 ?F (36.5 ?C) (Oral)  ? ?BP Readings from Last 3 Encounters:  ?12/27/21 122/84  ?11/08/21 (!) 151/93  ?04/25/21 122/80  ? ?Pulse Readings from Last 3 Encounters:  ?12/27/21 71  ?11/08/21 72  ?04/25/21 (!) 58  ? ? ?Physical Exam ? ?Assessment  ?Plan  ?Annual physical exam ?See below  ? ?Hyperlipidemia, unspecified hyperlipidemia type - Plan: Semaglutide-Weight Management (WEGOVY) 0.25 MG/0.5ML SOAJ, Semaglutide-Weight Management (WEGOVY) 0.5 MG/0.5ML SOAJ, Semaglutide-Weight Management (WEGOVY) 1 MG/0.5ML SOAJ, Semaglutide-Weight Management (WEGOVY) 1.7 MG/0.75ML SOAJ, Semaglutide-Weight Management (WEGOVY) 2.4 MG/0.75ML SOAJ, Lipid panel, ezetimibe (ZETIA) 10 MG tablet ? ?Primary  hypertension - Plan: Semaglutide-Weight Management (WEGOVY) 0.25 MG/0.5ML SOAJ, Semaglutide-Weight Management (WEGOVY) 0.5 MG/0.5ML SOAJ, Semaglutide-Weight Management (WEGOVY) 1 MG/0.5ML SOAJ, Semaglutide-Weight Management (WEG

## 2021-12-27 NOTE — Addendum Note (Signed)
Addended by: Orland Mustard on: 12/27/2021 04:58 PM ? ? Modules accepted: Orders ? ?

## 2021-12-27 NOTE — Patient Instructions (Signed)
Semaglutide Injection (Weight Management) ?What is this medication? ?SEMAGLUTIDE (SEM a GLOO tide) promotes weight loss. It may also be used to maintain weight loss. It works by decreasing appetite. Changes to diet and exercise are often combined with this medication. ?This medicine may be used for other purposes; ask your health care provider or pharmacist if you have questions. ?COMMON BRAND NAME(S): Wegovy ?What should I tell my care team before I take this medication? ?They need to know if you have any of these conditions: ?Endocrine tumors (MEN 2) or if someone in your family had these tumors ?Eye disease, vision problems ?Gallbladder disease ?History of depression or mental health disease ?History of pancreatitis ?Kidney disease ?Stomach or intestine problems ?Suicidal thoughts, plans, or attempt; a previous suicide attempt by you or a family member ?Thyroid cancer or if someone in your family had thyroid cancer ?An unusual or allergic reaction to semaglutide, other medications, foods, dyes, or preservatives ?Pregnant or trying to get pregnant ?Breast-feeding ?How should I use this medication? ?This medication is injected under the skin. You will be taught how to prepare and give it. Take it as directed on the prescription label. It is given once every week (every 7 days). Keep taking it unless your care team tells you to stop. ?It is important that you put your used needles and pens in a special sharps container. Do not put them in a trash can. If you do not have a sharps container, call your pharmacist or care team to get one. ?A special MedGuide will be given to you by the pharmacist with each prescription and refill. Be sure to read this information carefully each time. ?This medication comes with INSTRUCTIONS FOR USE. Ask your pharmacist for directions on how to use this medication. Read the information carefully. Talk to your pharmacist or care team if you have questions. ?Talk to your care team about  the use of this medication in children. Special care may be needed. ?Overdosage: If you think you have taken too much of this medicine contact a poison control center or emergency room at once. ?NOTE: This medicine is only for you. Do not share this medicine with others. ?What if I miss a dose? ?If you miss a dose and the next scheduled dose is more than 2 days away, take the missed dose as soon as possible. If you miss a dose and the next scheduled dose is less than 2 days away, do not take the missed dose. Take the next dose at your regular time. Do not take double or extra doses. If you miss your dose for 2 weeks or more, take the next dose at your regular time or call your care team to talk about how to restart this medication. ?What may interact with this medication? ?Insulin and other medications for diabetes ?This list may not describe all possible interactions. Give your health care provider a list of all the medicines, herbs, non-prescription drugs, or dietary supplements you use. Also tell them if you smoke, drink alcohol, or use illegal drugs. Some items may interact with your medicine. ?What should I watch for while using this medication? ?Visit your care team for regular checks on your progress. It may be some time before you see the benefit from this medication. ?Drink plenty of fluids while taking this medication. Check with your care team if you have severe diarrhea, nausea, and vomiting, or if you sweat a lot. The loss of too much body fluid may make it dangerous for  you to take this medication. ?This medication may affect blood sugar levels. Ask your care team if changes in diet or medications are needed if you have diabetes. ?If you or your family notice any changes in your behavior, such as new or worsening depression, thoughts of harming yourself, anxiety, other unusual or disturbing thoughts, or memory loss, call your care team right away. ?Women should inform their care team if they wish to  become pregnant or think they might be pregnant. Losing weight while pregnant is not advised and may cause harm to the unborn child. Talk to your care team for more information. ?What side effects may I notice from receiving this medication? ?Side effects that you should report to your care team as soon as possible: ?Allergic reactions--skin rash, itching, hives, swelling of the face, lips, tongue, or throat ?Change in vision ?Dehydration--increased thirst, dry mouth, feeling faint or lightheaded, headache, dark yellow or brown urine ?Gallbladder problems--severe stomach pain, nausea, vomiting, fever ?Heart palpitations--rapid, pounding, or irregular heartbeat ?Kidney injury--decrease in the amount of urine, swelling of the ankles, hands, or feet ?Pancreatitis--severe stomach pain that spreads to your back or gets worse after eating or when touched, fever, nausea, vomiting ?Thoughts of suicide or self-harm, worsening mood, feelings of depression ?Thyroid cancer--new mass or lump in the neck, pain or trouble swallowing, trouble breathing, hoarseness ?Side effects that usually do not require medical attention (report to your care team if they continue or are bothersome): ?Diarrhea ?Loss of appetite ?Nausea ?Stomach pain ?Vomiting ?This list may not describe all possible side effects. Call your doctor for medical advice about side effects. You may report side effects to FDA at 1-800-FDA-1088. ?Where should I keep my medication? ?Keep out of the reach of children and pets. ?Refrigeration (preferred): Store in the refrigerator. Do not freeze. Keep this medication in the original container until you are ready to take it. Get rid of any unused medication after the expiration date. ?Room temperature: If needed, prior to cap removal, the pen can be stored at room temperature for up to 28 days. Protect from light. If it is stored at room temperature, get rid of any unused medication after 28 days or after it expires,  whichever is first. ?It is important to get rid of the medication as soon as you no longer need it or it is expired. You can do this in two ways: ?Take the medication to a medication take-back program. Check with your pharmacy or law enforcement to find a location. ?If you cannot return the medication, follow the directions in the [place]. ?NOTE: This sheet is a summary. It may not cover all possible information. If you have questions about this medicine, talk to your doctor, pharmacist, or health care provider. ?? 2022 Elsevier/Gold Standard (2020-12-16 00:00:00) ? ?

## 2021-12-28 LAB — URINALYSIS, ROUTINE W REFLEX MICROSCOPIC
Bilirubin Urine: NEGATIVE
Glucose, UA: NEGATIVE
Hgb urine dipstick: NEGATIVE
Ketones, ur: NEGATIVE
Leukocytes,Ua: NEGATIVE
Nitrite: NEGATIVE
Protein, ur: NEGATIVE
Specific Gravity, Urine: 1.004 (ref 1.001–1.035)
pH: 7.5 (ref 5.0–8.0)

## 2022-01-03 ENCOUNTER — Telehealth: Payer: Self-pay | Admitting: Internal Medicine

## 2022-01-03 NOTE — Telephone Encounter (Signed)
Danielle Sparks (Key: [id number]) ?Rx #: [id number] ?Wegovy 0.'25MG'$ /0.5ML auto-injectors ?  ?Form ?[employer] Electronic PA Form [phone] NCPDP) ?

## 2022-01-29 NOTE — Telephone Encounter (Signed)
Danielle Sparks (Key: [id number]) ?Rx #: [id number] ?Wegovy 0.'25MG'$ /0.5ML auto-injectors ? ?Coverage denied 01/04/22 ?Reason being medication is not covered under benefit plan. ?

## 2022-01-30 ENCOUNTER — Ambulatory Visit (INDEPENDENT_AMBULATORY_CARE_PROVIDER_SITE_OTHER): Payer: 59

## 2022-01-30 DIAGNOSIS — Z23 Encounter for immunization: Secondary | ICD-10-CM

## 2022-01-30 NOTE — Progress Notes (Signed)
Patient came in today for second Heplisav vaccine given in left deltoid. Patient tolerated well with no signs of distress.  ?

## 2022-02-20 DIAGNOSIS — R69 Illness, unspecified: Secondary | ICD-10-CM | POA: Diagnosis not present

## 2022-02-20 DIAGNOSIS — F411 Generalized anxiety disorder: Secondary | ICD-10-CM | POA: Diagnosis not present

## 2022-03-14 DIAGNOSIS — F411 Generalized anxiety disorder: Secondary | ICD-10-CM | POA: Diagnosis not present

## 2022-03-14 DIAGNOSIS — R69 Illness, unspecified: Secondary | ICD-10-CM | POA: Diagnosis not present

## 2022-03-23 ENCOUNTER — Other Ambulatory Visit: Payer: Self-pay | Admitting: Internal Medicine

## 2022-03-23 DIAGNOSIS — E669 Obesity, unspecified: Secondary | ICD-10-CM

## 2022-03-23 NOTE — Telephone Encounter (Signed)
Refilled: 12/27/2021 Last OV: 12/27/2021 Next OV: 06/28/2022

## 2022-06-08 ENCOUNTER — Telehealth (INDEPENDENT_AMBULATORY_CARE_PROVIDER_SITE_OTHER): Payer: 59 | Admitting: Internal Medicine

## 2022-06-08 ENCOUNTER — Telehealth: Payer: Self-pay

## 2022-06-08 ENCOUNTER — Encounter: Payer: Self-pay | Admitting: Internal Medicine

## 2022-06-08 VITALS — Ht 66.0 in | Wt 174.0 lb

## 2022-06-08 DIAGNOSIS — U071 COVID-19: Secondary | ICD-10-CM | POA: Diagnosis not present

## 2022-06-08 DIAGNOSIS — J0110 Acute frontal sinusitis, unspecified: Secondary | ICD-10-CM

## 2022-06-08 MED ORDER — SALINE SPRAY 0.65 % NA SOLN: 2.0000 | NASAL | 0 refills | Status: DC | PRN

## 2022-06-08 MED ORDER — FLUTICASONE PROPIONATE 50 MCG/ACT NA SUSP: 2.0000 | Freq: Every day | NASAL | 6 refills | Status: DC

## 2022-06-08 MED ORDER — MOLNUPIRAVIR EUA 200MG CAPSULE: 4.0000 | ORAL_CAPSULE | Freq: Two times a day (BID) | ORAL | 0 refills | Status: AC

## 2022-06-08 MED ORDER — AZITHROMYCIN 250 MG PO TABS: ORAL_TABLET | ORAL | 0 refills | Status: AC

## 2022-06-08 NOTE — Patient Instructions (Signed)
If needing prescription strength medication we will need to make an appointment with a provider.  These are over the counter medication options:  Mucinex dm green label for cough or robitussin DM  Multivitamin or below vitamins  Vitamin C 1000 mg daily.  Vitamin D3 4000 Iu (units) daily.  Zinc 100 mg daily.  Quercetin 250-500 mg 2 times per day   Elderberry  Oil of oregano  cepacol or chloroseptic spray Warm salt water gargles +hydrogen peroxide Sugar free cough drops  Warm tea with honey and lemon  Hydration  Try to eat though you dont feel like it   Tylenol or Advil  Nasal saline and Flonase 2 sprays nasal congestion  If sneezing/runny nose over the counter allergy pill claritin,allegra, zyrtec, xyzal Quarantine x 10-14 days 14 days preferred   Monitor pulse oximeter, buy from [place] if oxygen is less than 90 please go to the hospital.        Are you feeling really sick? Shortness of breath, cough, chest pain?, dizziness? Confusion   If so let me know  If worsening, go to hospital or [facility] clinic Urgent care for further treatment.

## 2022-06-08 NOTE — Progress Notes (Signed)
Virtual Visit via Video Note  I connected with Danielle Sparks   on 06/08/22 at  3:00 PM EDT by a video enabled telemedicine application and verified that I am speaking with the correct person using two identifiers.  Location patient: [place] Location provider:work or home office Persons participating in the virtual visit: patient, provider  I discussed the limitations and requested verbal permission for telemedicine visit. The patient expressed understanding and agreed to proceed.   HPI:  Acute telemedicine visit for : Pt tested + covid today has cough sinus pressure fever 100.7 which stopped today sweating eyes burning. She tried hot compress and Raw[place]ighs oint w/o relief and has taken advil, tylenol, sudafed 30 mg qd her husband had covid and  son and law and his family had covid  -Pertinent past medical history: see below -Pertinent medication allergies: Allergies  Allergen Reactions   Sulfa Antibiotics Itching    Rash under the skin   Xanax [Alprazolam] Other (See Comments)    Night terrors   -COVID-19 vaccine status:  Immunization History  Administered Date(s) Administered   H1N1 05/25/2021   Hepatitis B, adult 05/22/2018, 07/10/2018   Hepb-cpg 12/27/2021, 01/30/2022   Influenza,inj,Quad PF,6+ Mos 07/12/2017   Influenza,inj,quad, With Preservative 07/09/2018, 05/27/2019   Influenza-Unspecified 06/23/2020   Moderna SARS-COV2 Booster Vaccination 12/24/2020   Moderna Sars-Covid-2 Vaccination 11/03/2019, 12/02/2019, 07/07/2020   Pneumococcal Polysaccharide-23 07/09/2018   Tdap 05/13/2012     ROS: See pertinent positives and negatives per HPI.  Past Medical History:  Diagnosis Date   Breast cancer ([place]) 09/2012   chemo and radiation right   Bunion    Cancer ([place])    breast   Depression    Hyperlipidemia    Hypertension    Other vitamin B12 deficiency anemia    Other vitamin B12 deficiency anemia    Personal history of chemotherapy    Personal history of radiation  therapy    Postmenopausal atrophic vaginitis    Shingles    right flank    Past Surgical History:  Procedure Laterality Date   ABDOMINAL HYSTERECTOMY     BREAST BIOPSY Left 2002   stereotactic   breast clip     left breast   BREAST EXCISIONAL BIOPSY Right 2014   positive   BUNIONECTOMY     bilateral   GANGLION CYST EXCISION  01-09-10   Removed    WISDOM TOOTH EXTRACTION       Current Outpatient Medications:    amLODipine (NORVASC) 5 MG tablet, Take 1 tablet (5 mg total) by mouth daily., Disp: 90 tablet, Rfl: 3   azithromycin (ZITHROMAX) 250 MG tablet, With food Take 2 tablets on day 1, then 1 tablet daily on days 2 through 5, Disp: 6 tablet, Rfl: 0   buPROPion (WELLBUTRIN) 75 MG tablet, Take 150 mg by mouth 2 (two) times daily. , Disp: , Rfl:    calcium-vitamin D (OSCAL WITH D) 500-200 MG-UNIT tablet, Take 1 tablet by mouth., Disp: , Rfl:    cholecalciferol (VITAMIN D) 400 units TABS tablet, Take 400 Units by mouth., Disp: , Rfl:    Cyanocobalamin (B-12 PO), Take 400 mcg by mouth daily at 12 noon., Disp: , Rfl:    ezetimibe (ZETIA) 10 MG tablet, Take 1 tablet (10 mg total) by mouth daily., Disp: 90 tablet, Rfl: 3   fluticasone (FLONASE) 50 MCG/ACT nasal spray, Place 2 sprays into both nostrils daily., Disp: 16 g, Rfl: 6   hydrocortisone 2.5 % cream, Apply topically 2 (two) times  daily as needed., Disp: 28.35 g, Rfl: 11   lamoTRIgine (LAMICTAL) 100 MG tablet, Take 1 tablet (100 mg total) by mouth daily., Disp: , Rfl:    meloxicam (MOBIC) 7.5 MG tablet, Take 1 tablet (7.5 mg total) by mouth daily as needed for pain., Disp: 90 tablet, Rfl: 3   methocarbamol (ROBAXIN) 500 MG tablet, Take 500 mg by mouth daily as needed for muscle spasms., Disp: , Rfl:    molnupiravir EUA (LAGEVRIO) 200 mg CAPS capsule, Take 4 capsules (800 mg total) by mouth 2 (two) times daily for 5 days. With food, Disp: 40 capsule, Rfl: 0   mupirocin ointment (BACTROBAN) 2 %, Apply 1 application. topically 2 (two)  times daily. Prn, Disp: 30 g, Rfl: 0   omeprazole (PRILOSEC OTC) 20 MG tablet, Take 20 mg by mouth daily., Disp: , Rfl:    phentermine (ADIPEX-P) 37.5 MG tablet, Take 1 tablet (37.5 mg total) by mouth daily before breakfast. Rx 1/2, Disp: 60 tablet, Rfl: 0   phentermine (ADIPEX-P) 37.5 MG tablet, TAKE ONE TABLET BY MOUTH EVERY DAY BEFORE BREAKFAST, Disp: 60 tablet, Rfl: 0   Semaglutide-Weight Management (WEGOVY) 0.25 MG/0.5ML SOAJ, Inject 0.25 mg into the skin once a week., Disp: 2 mL, Rfl: 0   Semaglutide-Weight Management (WEGOVY) 0.5 MG/0.5ML SOAJ, Inject 0.5 mg into the skin once a week., Disp: 2 mL, Rfl: 0   Semaglutide-Weight Management (WEGOVY) 1 MG/0.5ML SOAJ, Inject 1 mg into the skin once a week., Disp: 2 mL, Rfl: 0   Semaglutide-Weight Management (WEGOVY) 1.7 MG/0.75ML SOAJ, Inject 1.7 mg into the skin once a week., Disp: 3 mL, Rfl: 0   Semaglutide-Weight Management (WEGOVY) 2.4 MG/0.75ML SOAJ, Inject 2.4 mg into the skin once a week., Disp: 3 mL, Rfl: 0   sertraline (ZOLOFT) 100 MG tablet, Take 150 mg by mouth daily., Disp: , Rfl:    sodium chloride (OCEAN) 0.65 % SOLN nasal spray, Place 2 sprays into both nostrils as needed for congestion., Disp: 30 mL, Rfl: 0   telmisartan (MICARDIS) 40 MG tablet, TAKE ONE TABLET (40 MG) BY MOUTH EVERY MORNING, Disp: 90 tablet, Rfl: 3   triamterene-hydrochlorothiazide (MAXZIDE-25) 37.5-25 MG tablet, TAKE 1 TABLET BY MOUTH ONCE DAILY, Disp: 90 tablet, Rfl: 3  EXAM:  VITALS per patient if applicable:  GENERAL: alert, oriented, appears well and in no acute distress  HEENT: atraumatic, conjunttiva clear, no obvious abnormalities on inspection of external nose and ears  NECK: normal movements of the head and neck  LUNGS: on inspection no signs of respiratory distress, breathing rate appears normal, no obvious gross SOB, gasping or wheezing  CV: no obvious cyanosis  MS: moves all visible extremities without noticeable abnormality  PSYCH/NEURO:  pleasant and cooperative, no obvious depression or anxiety, speech and thought processing grossly intact  ASSESSMENT AND PLAN:  Discussed the following assessment and plan:  COVID-19 - Plan: azithromycin (ZITHROMAX) 250 MG tablet, molnupiravir EUA (LAGEVRIO) 200 mg CAPS capsule 4 pills bid   Acute non-recurrent frontal sinusitis - Plan: azithromycin (ZITHROMAX) 250 MG tablet, molnupiravir EUA (LAGEVRIO) 200 mg CAPS capsule, sodium chloride (OCEAN) 0.65 % SOLN nasal spray, fluticasone (FLONASE) 50 MCG/ACT nasal spray Supportive care  Tylenol alt advil prn  Hydration and food   -we discussed possible serious and likely etiologies, options for evaluation and workup, limitations of telemedicine visit vs in person visit, treatment, treatment risks and precautions. Pt is agreeable to treatment via telemedicine at this moment.     I discussed the assessment and treatment  plan with the patient. The patient was provided an opportunity to ask questions and all were answered. The patient agreed with the plan and demonstrated an understanding of the instructions.    Time spent 20 minutes Delorise Jackson, MD

## 2022-06-08 NOTE — Telephone Encounter (Signed)
LMOM for pt to CB in regards to her telehealth appt to get her checked in and ready.

## 2022-06-13 DIAGNOSIS — F411 Generalized anxiety disorder: Secondary | ICD-10-CM | POA: Diagnosis not present

## 2022-06-13 DIAGNOSIS — R69 Illness, unspecified: Secondary | ICD-10-CM | POA: Diagnosis not present

## 2022-06-28 ENCOUNTER — Encounter: Payer: Self-pay | Admitting: Internal Medicine

## 2022-06-28 ENCOUNTER — Ambulatory Visit (INDEPENDENT_AMBULATORY_CARE_PROVIDER_SITE_OTHER): Payer: 59 | Admitting: Internal Medicine

## 2022-06-28 VITALS — BP 138/84 | HR 87 | Temp 98.0°F | Ht 66.0 in | Wt 160.0 lb

## 2022-06-28 DIAGNOSIS — Z1211 Encounter for screening for malignant neoplasm of colon: Secondary | ICD-10-CM | POA: Diagnosis not present

## 2022-06-28 DIAGNOSIS — R7303 Prediabetes: Secondary | ICD-10-CM

## 2022-06-28 DIAGNOSIS — Z23 Encounter for immunization: Secondary | ICD-10-CM | POA: Diagnosis not present

## 2022-06-28 DIAGNOSIS — E785 Hyperlipidemia, unspecified: Secondary | ICD-10-CM

## 2022-06-28 DIAGNOSIS — E669 Obesity, unspecified: Secondary | ICD-10-CM

## 2022-06-28 DIAGNOSIS — J0110 Acute frontal sinusitis, unspecified: Secondary | ICD-10-CM

## 2022-06-28 DIAGNOSIS — I10 Essential (primary) hypertension: Secondary | ICD-10-CM

## 2022-06-28 MED ORDER — AMLODIPINE BESYLATE 5 MG PO TABS: 5.0000 mg | ORAL_TABLET | Freq: Every day | ORAL | 3 refills | Status: DC

## 2022-06-28 MED ORDER — EZETIMIBE 10 MG PO TABS: 10.0000 mg | ORAL_TABLET | Freq: Every day | ORAL | 3 refills | Status: DC

## 2022-06-28 MED ORDER — TELMISARTAN 80 MG PO TABS: 40.0000 mg | ORAL_TABLET | Freq: Every day | ORAL | 3 refills | Status: DC

## 2022-06-28 MED ORDER — FLUTICASONE PROPIONATE 50 MCG/ACT NA SUSP: 2.0000 | Freq: Every day | NASAL | 3 refills | Status: AC

## 2022-06-28 MED ORDER — TRIAMTERENE-HCTZ 37.5-25 MG PO TABS: 1.0000 | ORAL_TABLET | Freq: Every day | ORAL | 3 refills | Status: DC

## 2022-06-28 MED ORDER — PHENTERMINE HCL 37.5 MG PO TABS: 37.5000 mg | ORAL_TABLET | Freq: Every day | ORAL | 0 refills | Status: DC

## 2022-06-28 MED ORDER — TELMISARTAN 40 MG PO TABS: ORAL_TABLET | ORAL | 3 refills | Status: DC

## 2022-06-28 MED ORDER — SALINE SPRAY 0.65 % NA SOLN: 2.0000 | NASAL | 3 refills | Status: AC | PRN

## 2022-06-28 NOTE — Patient Instructions (Addendum)
Mammogram due 11/23/22   Your colonoscopy is due call [place] and schedule  Dr Hilarie Fredrickson  Phone Fax E-mail Address  [phone] [email or web address] [address] [address]     Specialties     Gastroenterology              Tdap (Tetanus, Diphtheria, Pertussis) Vaccine: What You Need to Know 1. Why get vaccinated? Tdap vaccine can prevent tetanus, diphtheria, and pertussis. Diphtheria and pertussis spread from person to person. Tetanus enters the body through cuts or wounds. TETANUS (T) causes painful stiffening of the muscles. Tetanus can lead to serious health problems, including being unable to open the mouth, having trouble swallowing and breathing, or death. DIPHTHERIA (D) can lead to difficulty breathing, heart failure, paralysis, or death. PERTUSSIS (aP), also known as "whooping cough," can cause uncontrollable, violent coughing that makes it hard to breathe, eat, or drink. Pertussis can be extremely serious especially in babies and young children, causing pneumonia, convulsions, brain damage, or death. In teens and adults, it can cause weight loss, loss of bladder control, passing out, and rib fractures from severe coughing. 2. Tdap vaccine Tdap is only for children 7 years and older, adolescents, and adults.  Adolescents should receive a single dose of Tdap, preferably at age 38 or 74 years. Pregnant people should get a dose of Tdap during every pregnancy, preferably during the early part of the third trimester, to help protect the newborn from pertussis. Infants are most at risk for severe, life-threatening complications from pertussis. Adults who have never received Tdap should get a dose of Tdap. Also, adults should receive a booster dose of either Tdap or Td (a different vaccine that protects against tetanus and diphtheria but not pertussis) every 10 years, or after 5 years in the case of a severe or dirty wound or burn. Tdap may be given at the  same time as other vaccines. 3. Talk with your health care provider Tell your vaccine provider if the person getting the vaccine: Has had an allergic reaction after a previous dose of any vaccine that protects against tetanus, diphtheria, or pertussis, or has any severe, life-threatening allergies Has had a coma, decreased level of consciousness, or prolonged seizures within 7 days after a previous dose of any pertussis vaccine (DTP, DTaP, or Tdap) Has seizures or another nervous system problem Has ever had Guillain-Barr Syndrome (also called "GBS") Has had severe pain or swelling after a previous dose of any vaccine that protects against tetanus or diphtheria In some cases, your health care provider may decide to postpone Tdap vaccination until a future visit. People with minor illnesses, such as a cold, may be vaccinated. People who are moderately or severely ill should usually wait until they recover before getting Tdap vaccine.  Your health care provider can give you more information. 4. Risks of a vaccine reaction Pain, redness, or swelling where the shot was given, mild fever, headache, feeling tired, and nausea, vomiting, diarrhea, or stomachache sometimes happen after Tdap vaccination. People sometimes faint after medical procedures, including vaccination. Tell your provider if you feel dizzy or have vision changes or ringing in the ears.  As with any medicine, there is a very remote chance of a vaccine causing a severe allergic reaction, other serious injury, or death. 5. What if there is a serious problem? An allergic reaction could occur after the vaccinated person leaves the clinic. If you see signs of a severe allergic reaction (hives, swelling of the  face and throat, difficulty breathing, a fast heartbeat, dizziness, or weakness), call 9-1-1 and get the person to the nearest hospital. For other signs that concern you, call your health care provider.  Adverse reactions should be  reported to the Vaccine Adverse Event Reporting System (VAERS). Your health care provider will usually file this report, or you can do it yourself. Visit the VAERS website at www.vaers.SamedayNews.es or call 317 020 8951. VAERS is only for reporting reactions, and VAERS staff members do not give medical advice. 6. The National Vaccine Injury Compensation Program The [employer] Vaccine Injury Compensation Program (VICP) is a federal program that was created to compensate people who may have been injured by certain vaccines. Claims regarding alleged injury or death due to vaccination have a time limit for filing, which may be as short as two years. Visit the VICP website at GoldCloset.com.ee or call 236-564-4339 to learn about the program and about filing a claim. 7. How can I learn more? Ask your health care provider. Call your local or state health department. Visit the website of the Food and Drug Administration (FDA) for vaccine package inserts and additional information at TraderRating.uy. Contact the Centers for Disease Control and Prevention (CDC): Call 410-029-9037 (1-800-CDC-INFO) or Visit CDC's website at http://hunter.com/. Source: CDC Vaccine Information Statement Tdap (Tetanus, Diphtheria, Pertussis) Vaccine (04/29/2020) This same material is available at http://www.wolf.info/ for no charge. This information is not intended to replace advice given to you by your health care provider. Make sure you discuss any questions you have with your health care provider. Document Revised: 08/09/2021 Document Reviewed: 06/12/2021 Elsevier Patient Education  [place].

## 2022-06-28 NOTE — Progress Notes (Signed)
No chief complaint on file.  Fu  1. Tdap due and screening colonoscopy  2. Htn sl elevated today on maxzide, micardis 40 mg qd, norvasc 5 mg qd   3. Doing better after covid 19    Review of Systems  Constitutional:  Negative for weight loss.  HENT:  Negative for hearing loss.   Eyes:  Negative for blurred vision.  Respiratory:  Negative for shortness of breath.   Cardiovascular:  Negative for chest pain.  Gastrointestinal:  Negative for abdominal pain and blood in stool.  Genitourinary:  Negative for dysuria.  Musculoskeletal:  Negative for falls and joint pain.  Skin:  Negative for rash.  Neurological:  Negative for headaches.  Psychiatric/Behavioral:  Negative for depression.    Past Medical History:  Diagnosis Date   Breast cancer ([place]) 09/2012   chemo and radiation right   Bunion    Cancer [facility])    breast   COVID-19    06/08/22   Depression    Hyperlipidemia    Hypertension    Other vitamin B12 deficiency anemia    Other vitamin B12 deficiency anemia    Personal history of chemotherapy    Personal history of radiation therapy    Postmenopausal atrophic vaginitis    Shingles    right flank   Past Surgical History:  Procedure Laterality Date   ABDOMINAL HYSTERECTOMY     BREAST BIOPSY Left 2002   stereotactic   breast clip     left breast   BREAST EXCISIONAL BIOPSY Right 2014   positive   BUNIONECTOMY     bilateral   GANGLION CYST EXCISION  01-09-10   Removed    WISDOM TOOTH EXTRACTION     Family History  Problem Relation Age of Onset   Heart disease Father    Skin cancer Father    Prostate cancer Father    COPD Father    Skin cancer Mother    Heart disease Mother        afib   Hyperlipidemia Mother    Breast cancer Paternal Aunt 82   Breast cancer Cousin 23   Hyperlipidemia Sister    Colon cancer Neg Hx    Rectal cancer Neg Hx    Stomach cancer Neg Hx    Social History   Socioeconomic History   Marital status: Married    Spouse name: Not on  file   Number of children: Not on file   Years of education: Not on file   Highest education level: Not on file  Occupational History   Not on file  Tobacco Use   Smoking status: Never   Smokeless tobacco: Never  Vaping Use   Vaping Use: Never used  Substance and Sexual Activity   Alcohol use: Yes    Comment: occasional glass of wine   Drug use: No   Sexual activity: Not on file  Other Topics Concern   Not on file  Social History Narrative   Married    2 kids    Used to own Public affairs consultant    Social Determinants of Health   Financial Resource Strain: Not on file  Food Insecurity: Not on file  Transportation Needs: Not on file  Physical Activity: Not on file  Stress: Not on file  Social Connections: Not on file  Intimate Partner Violence: Not on file   Current Meds  Medication Sig   buPROPion (WELLBUTRIN) 75 MG tablet Take 150 mg by mouth 2 (two) times daily.  calcium-vitamin D (OSCAL WITH D) 500-200 MG-UNIT tablet Take 1 tablet by mouth.   cholecalciferol (VITAMIN D) 400 units TABS tablet Take 400 Units by mouth.   Cyanocobalamin (B-12 PO) Take 400 mcg by mouth daily at 12 noon.   hydrocortisone 2.5 % cream Apply topically 2 (two) times daily as needed.   lamoTRIgine (LAMICTAL) 100 MG tablet Take 1 tablet (100 mg total) by mouth daily.   meloxicam (MOBIC) 7.5 MG tablet Take 1 tablet (7.5 mg total) by mouth daily as needed for pain.   methocarbamol (ROBAXIN) 500 MG tablet Take 500 mg by mouth daily as needed for muscle spasms.   mupirocin ointment (BACTROBAN) 2 % Apply 1 application. topically 2 (two) times daily. Prn   omeprazole (PRILOSEC OTC) 20 MG tablet Take 20 mg by mouth daily.   phentermine (ADIPEX-P) 37.5 MG tablet TAKE ONE TABLET BY MOUTH EVERY DAY BEFORE BREAKFAST   sertraline (ZOLOFT) 100 MG tablet Take 150 mg by mouth daily.   [DISCONTINUED] amLODipine (NORVASC) 5 MG tablet Take 1 tablet (5 mg total) by mouth daily.   [DISCONTINUED] ezetimibe (ZETIA)  10 MG tablet Take 1 tablet (10 mg total) by mouth daily.   [DISCONTINUED] fluticasone (FLONASE) 50 MCG/ACT nasal spray Place 2 sprays into both nostrils daily.   [DISCONTINUED] phentermine (ADIPEX-P) 37.5 MG tablet Take 1 tablet (37.5 mg total) by mouth daily before breakfast. Rx 1/2   [DISCONTINUED] Semaglutide-Weight Management (WEGOVY) 0.25 MG/0.5ML SOAJ Inject 0.25 mg into the skin once a week.   [DISCONTINUED] Semaglutide-Weight Management (WEGOVY) 0.5 MG/0.5ML SOAJ Inject 0.5 mg into the skin once a week.   [DISCONTINUED] Semaglutide-Weight Management (WEGOVY) 1 MG/0.5ML SOAJ Inject 1 mg into the skin once a week.   [DISCONTINUED] Semaglutide-Weight Management (WEGOVY) 1.7 MG/0.75ML SOAJ Inject 1.7 mg into the skin once a week.   [DISCONTINUED] Semaglutide-Weight Management (WEGOVY) 2.4 MG/0.75ML SOAJ Inject 2.4 mg into the skin once a week.   [DISCONTINUED] sodium chloride (OCEAN) 0.65 % SOLN nasal spray Place 2 sprays into both nostrils as needed for congestion.   [DISCONTINUED] telmisartan (MICARDIS) 40 MG tablet TAKE ONE TABLET (40 MG) BY MOUTH EVERY MORNING   [DISCONTINUED] triamterene-hydrochlorothiazide (MAXZIDE-25) 37.5-25 MG tablet TAKE 1 TABLET BY MOUTH ONCE DAILY   Allergies  Allergen Reactions   Sulfa Antibiotics Itching    Rash under the skin   Xanax [Alprazolam] Other (See Comments)    Night terrors   No results found for this or any previous visit (from the past 2160 hour(s)). Objective  Body mass index is 25.82 kg/m. Wt Readings from Last 3 Encounters:  06/28/22 160 lb (72.6 kg)  06/08/22 174 lb (78.9 kg)  12/27/21 174 lb 3.2 oz (79 kg)   Temp Readings from Last 3 Encounters:  06/28/22 98 F (36.7 C) (Oral)  12/27/21 98.2 F (36.8 C) (Oral)  11/08/21 (!) 97 F (36.1 C) (Oral)   BP Readings from Last 3 Encounters:  06/28/22 138/84  12/27/21 122/84  11/08/21 (!) 151/93   Pulse Readings from Last 3 Encounters:  06/28/22 87  12/27/21 71  11/08/21 72     Physical Exam Vitals and nursing note reviewed.  Constitutional:      Appearance: Normal appearance. She is well-developed and well-groomed.  HENT:     Head: Normocephalic and atraumatic.  Eyes:     Conjunctiva/sclera: Conjunctivae normal.     Pupils: Pupils are equal, round, and reactive to light.  Cardiovascular:     Rate and Rhythm: Normal rate and regular rhythm.  Heart sounds: Normal heart sounds. No murmur heard. Pulmonary:     Effort: Pulmonary effort is normal.     Breath sounds: Normal breath sounds.  Abdominal:     General: Abdomen is flat. Bowel sounds are normal.     Tenderness: There is no abdominal tenderness.  Musculoskeletal:        General: No tenderness.  Skin:    General: Skin is warm and dry.  Neurological:     General: No focal deficit present.     Mental Status: She is alert and oriented to person, place, and time. Mental status is at baseline.     Cranial Nerves: Cranial nerves 2-12 are intact.     Motor: Motor function is intact.     Coordination: Coordination is intact.     Gait: Gait is intact.  Psychiatric:        Attention and Perception: Attention and perception normal.        Mood and Affect: Mood and affect normal.        Speech: Speech normal.        Behavior: Behavior normal. Behavior is cooperative.        Thought Content: Thought content normal.        Cognition and Memory: Cognition and memory normal.        Judgment: Judgment normal.     Assessment  Plan  Essential hypertension - Plan: amLODipine (NORVASC) 5 MG tablet, telmisartan (MICARDIS) 80 MG tablet 1/2 pill if needed can take 1 pill qd, triamterene-hydrochlorothiazide (MAXZIDE-25) 37.5-25 MG tablet, Comprehensive metabolic panel, Lipid panel, CBC with Differential/Platelet  Obesity (BMI 30-39.9) - Plan: phentermine (ADIPEX-P) 37.5 MG tablet last Rx  Hyperlipidemia, unspecified hyperlipidemia type - Plan: ezetimibe (ZETIA) 10 MG tablet, Lipid panel  Prediabetes - Plan:  Hemoglobin A1c  HM Had flu shot and pna 21  vx Declines shingrix tdap had 05/13/12 Tdap given today   Hep B 2/3 today check hep b status Hep B 1/2 new given today MMR immune Declines shingrix and further covid shots   dexa 09/07/15 osteopenia need to reschedule DEXA for future ask H/o if they will schedule or want me to?   07/03/12 diverticulosis h/o hyperplastic polyp f/u in 10 years ordered    S/p hysterectomy for DUB, fibroids pap neg 05/13/12 will ask who ob/gyn is at f/u  Dx mammo 09/15/18 negative h/o breast cancer rec screening in 1 year will ask Dr. B will he order or should I?  11/09/20 neg 11/22/21 mammogram negative    Dexa 11/09/20 osteopenia    H/o right breast cancer 5 years ago f/u Dr. Rogue Bussing h/o   Never smoker    Derm appt pt wants to see Dr. Kellie Moor or Nicole Kindred in the future given info   -will hold as of 10/25/20   rec healthy diet and exercise Provider: Dr. Olivia Mackie McLean-Scocuzza-Internal Medicine

## 2022-07-05 ENCOUNTER — Encounter: Payer: Self-pay | Admitting: Internal Medicine

## 2022-07-05 ENCOUNTER — Other Ambulatory Visit (INDEPENDENT_AMBULATORY_CARE_PROVIDER_SITE_OTHER): Payer: 59

## 2022-07-05 DIAGNOSIS — E785 Hyperlipidemia, unspecified: Secondary | ICD-10-CM

## 2022-07-05 DIAGNOSIS — I10 Essential (primary) hypertension: Secondary | ICD-10-CM | POA: Diagnosis not present

## 2022-07-05 DIAGNOSIS — R7303 Prediabetes: Secondary | ICD-10-CM | POA: Diagnosis not present

## 2022-07-05 LAB — CBC WITH DIFFERENTIAL/PLATELET
Basophils Absolute: 0 10*3/uL (ref 0.0–0.1)
Basophils Relative: 0.8 % (ref 0.0–3.0)
Eosinophils Absolute: 0.1 10*3/uL (ref 0.0–0.7)
Eosinophils Relative: 2.5 % (ref 0.0–5.0)
HCT: 37.2 % (ref 36.0–46.0)
Hemoglobin: 12.6 g/dL (ref 12.0–15.0)
Lymphocytes Relative: 28.2 % (ref 12.0–46.0)
Lymphs Abs: 1.2 10*3/uL (ref 0.7–4.0)
MCHC: 33.8 g/dL (ref 30.0–36.0)
MCV: 95.7 fl (ref 78.0–100.0)
Monocytes Absolute: 0.3 10*3/uL (ref 0.1–1.0)
Monocytes Relative: 7.7 % (ref 3.0–12.0)
Neutro Abs: 2.6 10*3/uL (ref 1.4–7.7)
Neutrophils Relative %: 60.8 % (ref 43.0–77.0)
Platelets: 229 10*3/uL (ref 150.0–400.0)
RBC: 3.89 Mil/uL (ref 3.87–5.11)
RDW: 13.6 % (ref 11.5–15.5)
WBC: 4.2 10*3/uL (ref 4.0–10.5)

## 2022-07-05 LAB — LIPID PANEL
Cholesterol: 244 mg/dL — ABNORMAL HIGH (ref 0–200)
HDL: 67.7 mg/dL (ref >39.00)
LDL Cholesterol: 159 mg/dL — ABNORMAL HIGH (ref 0–99)
NonHDL: 175.87
Total CHOL/HDL Ratio: 4
Triglycerides: 82 mg/dL (ref 0.0–149.0)
VLDL: 16.4 mg/dL (ref 0.0–40.0)

## 2022-07-05 LAB — COMPREHENSIVE METABOLIC PANEL
ALT: 21 U/L (ref 0–35)
AST: 17 U/L (ref 0–37)
Albumin: 4.4 g/dL (ref 3.5–5.2)
Alkaline Phosphatase: 70 U/L (ref 39–117)
BUN: 14 mg/dL (ref 6–23)
CO2: 29 mEq/L (ref 19–32)
Calcium: 9.4 mg/dL (ref 8.4–10.5)
Chloride: 98 mEq/L (ref 96–112)
Creatinine, Ser: 0.87 mg/dL (ref 0.40–1.20)
GFR: 70.93 mL/min (ref >60.00)
Glucose, Bld: 86 mg/dL (ref 70–99)
Potassium: 3.9 mEq/L (ref 3.5–5.1)
Sodium: 136 mEq/L (ref 135–145)
Total Bilirubin: 0.6 mg/dL (ref 0.2–1.2)
Total Protein: 6.8 g/dL (ref 6.0–8.3)

## 2022-07-05 LAB — HEMOGLOBIN A1C: Hgb A1c MFr Bld: 6.2 % (ref 4.6–6.5)

## 2022-08-06 ENCOUNTER — Ambulatory Visit (AMBULATORY_SURGERY_CENTER): Payer: 59 | Admitting: *Deleted

## 2022-08-06 VITALS — Ht 66.0 in | Wt 162.0 lb

## 2022-08-06 DIAGNOSIS — Z1211 Encounter for screening for malignant neoplasm of colon: Secondary | ICD-10-CM

## 2022-08-06 MED ORDER — NA SULFATE-K SULFATE-MG SULF 17.5-3.13-1.6 GM/177ML PO SOLN: 1.0000 | Freq: Once | ORAL | 0 refills | Status: AC

## 2022-08-06 NOTE — Progress Notes (Signed)
No egg or soy allergy known to patient  No issues known to pt with past sedation with any surgeries or procedures Patient denies ever being told they had issues or difficulty with intubation  No FH of Malignant Hyperthermia Pt is  on diet pills Pt is not on  home 02  Pt is not on blood thinners  Pt denies issues with constipation  No A fib or A flutter Have any cardiac testing pending--NO Pt instructed to use [email or web address] or GoodRx for a price reduction on prep

## 2022-08-09 ENCOUNTER — Encounter: Payer: Self-pay | Admitting: Family Medicine

## 2022-08-09 DIAGNOSIS — D2262 Melanocytic nevi of left upper limb, including shoulder: Secondary | ICD-10-CM | POA: Diagnosis not present

## 2022-08-09 DIAGNOSIS — L538 Other specified erythematous conditions: Secondary | ICD-10-CM | POA: Diagnosis not present

## 2022-08-09 DIAGNOSIS — D2271 Melanocytic nevi of right lower limb, including hip: Secondary | ICD-10-CM | POA: Diagnosis not present

## 2022-08-09 DIAGNOSIS — D225 Melanocytic nevi of trunk: Secondary | ICD-10-CM | POA: Diagnosis not present

## 2022-08-09 DIAGNOSIS — L298 Other pruritus: Secondary | ICD-10-CM | POA: Diagnosis not present

## 2022-08-09 DIAGNOSIS — D2261 Melanocytic nevi of right upper limb, including shoulder: Secondary | ICD-10-CM | POA: Diagnosis not present

## 2022-08-09 DIAGNOSIS — D3610 Benign neoplasm of peripheral nerves and autonomic nervous system, unspecified: Secondary | ICD-10-CM | POA: Diagnosis not present

## 2022-08-09 DIAGNOSIS — D485 Neoplasm of uncertain behavior of skin: Secondary | ICD-10-CM | POA: Diagnosis not present

## 2022-08-09 DIAGNOSIS — R208 Other disturbances of skin sensation: Secondary | ICD-10-CM | POA: Diagnosis not present

## 2022-08-09 DIAGNOSIS — L570 Actinic keratosis: Secondary | ICD-10-CM | POA: Diagnosis not present

## 2022-08-09 DIAGNOSIS — X32XXXA Exposure to sunlight, initial encounter: Secondary | ICD-10-CM | POA: Diagnosis not present

## 2022-08-10 NOTE — Telephone Encounter (Signed)
Pt need a refill on mupirocin, hydrocortisone, amLODipine, ezetimibe, triamterene-hydrochlorothiazide, telmisartan sent to [facility] in [place]. Pt would like to be called when it is sent in Pharmacy number [phone]

## 2022-08-13 DIAGNOSIS — E785 Hyperlipidemia, unspecified: Secondary | ICD-10-CM | POA: Diagnosis not present

## 2022-08-13 DIAGNOSIS — M8580 Other specified disorders of bone density and structure, unspecified site: Secondary | ICD-10-CM | POA: Diagnosis not present

## 2022-08-13 DIAGNOSIS — L309 Dermatitis, unspecified: Secondary | ICD-10-CM | POA: Diagnosis not present

## 2022-08-13 DIAGNOSIS — Z853 Personal history of malignant neoplasm of breast: Secondary | ICD-10-CM | POA: Diagnosis not present

## 2022-08-13 DIAGNOSIS — R69 Illness, unspecified: Secondary | ICD-10-CM | POA: Diagnosis not present

## 2022-08-13 DIAGNOSIS — Z825 Family history of asthma and other chronic lower respiratory diseases: Secondary | ICD-10-CM | POA: Diagnosis not present

## 2022-08-13 DIAGNOSIS — R059 Cough, unspecified: Secondary | ICD-10-CM | POA: Diagnosis not present

## 2022-08-13 DIAGNOSIS — Z8249 Family history of ischemic heart disease and other diseases of the circulatory system: Secondary | ICD-10-CM | POA: Diagnosis not present

## 2022-08-13 DIAGNOSIS — I10 Essential (primary) hypertension: Secondary | ICD-10-CM | POA: Diagnosis not present

## 2022-08-13 DIAGNOSIS — F325 Major depressive disorder, single episode, in full remission: Secondary | ICD-10-CM | POA: Diagnosis not present

## 2022-08-13 DIAGNOSIS — R32 Unspecified urinary incontinence: Secondary | ICD-10-CM | POA: Diagnosis not present

## 2022-08-24 DIAGNOSIS — M67442 Ganglion, left hand: Secondary | ICD-10-CM | POA: Diagnosis not present

## 2022-08-29 ENCOUNTER — Encounter: Payer: Self-pay | Admitting: Internal Medicine

## 2022-09-04 ENCOUNTER — Encounter: Payer: Self-pay | Admitting: Internal Medicine

## 2022-09-04 ENCOUNTER — Ambulatory Visit (AMBULATORY_SURGERY_CENTER): Payer: 59 | Admitting: Internal Medicine

## 2022-09-04 VITALS — BP 138/89 | HR 76 | Temp 97.9°F | Resp 15 | Ht 66.0 in | Wt 162.0 lb

## 2022-09-04 DIAGNOSIS — Z1211 Encounter for screening for malignant neoplasm of colon: Secondary | ICD-10-CM | POA: Diagnosis not present

## 2022-09-04 DIAGNOSIS — D123 Benign neoplasm of transverse colon: Secondary | ICD-10-CM | POA: Diagnosis not present

## 2022-09-04 MED ORDER — SODIUM CHLORIDE 0.9 % IV SOLN: 500.0000 mL | Freq: Once | INTRAVENOUS | Status: DC

## 2022-09-04 NOTE — Patient Instructions (Addendum)
  confidentiality of this discharge information lies with you and/or your care-partner. 

## 2022-09-04 NOTE — Progress Notes (Signed)
Pt's states no medical or surgical changes since previsit or office visit. 

## 2022-09-04 NOTE — Op Note (Signed)
Danielle Sparks Patient Name: Danielle Sparks Procedure Date: 09/04/2022 8:31 AM MRN: [id number] Endoscopist: Jerene Bears , MD, [id number] Age: 61 Referring MD:  Date of Birth: 1960/12/14 Gender: Female Account #: [id number] Procedure:                Colonoscopy Indications:              Screening for colorectal malignant neoplasm, Last                            colonoscopy 10 years ago Medicines:                Monitored Anesthesia Care Procedure:                Pre-Anesthesia Assessment:                           - Prior to the procedure, a History and Physical                            was performed, and patient medications and                            allergies were reviewed. The patient's tolerance of                            previous anesthesia was also reviewed. The risks                            and benefits of the procedure and the sedation                            options and risks were discussed with the patient.                            All questions were answered, and informed consent                            was obtained. Prior Anticoagulants: The patient has                            taken no anticoagulant or antiplatelet agents. ASA                            Grade Assessment: II - A patient with mild systemic                            disease. After reviewing the risks and benefits,                            the patient was deemed in satisfactory condition to                            undergo the procedure.  After obtaining informed consent, the colonoscope                            was passed under direct vision. Throughout the                            procedure, the patient's blood pressure, pulse, and                            oxygen saturations were monitored continuously. The                            Olympus [id number]-H190DL [id number]) Colonoscope was                            introduced through the anus and  advanced to the                            cecum, identified by appendiceal orifice and                            ileocecal valve. The colonoscopy was performed                            without difficulty. The patient tolerated the                            procedure well. The quality of the bowel                            preparation was good. The ileocecal valve,                            appendiceal orifice, and rectum were photographed. Scope In: 8:38:24 AM Scope Out: 8:54:48 AM Scope Withdrawal Time: 0 hours 12 minutes 32 seconds  Total Procedure Duration: 0 hours 16 minutes 24 seconds  Findings:                 The digital rectal exam was normal.                           A 3 mm polyp was found in the transverse colon. The                            polyp was sessile. The polyp was removed with a                            cold snare. Resection and retrieval were complete.                           A few small-mouthed diverticula were found in the                            sigmoid colon.  External and internal hemorrhoids were found during                            retroflexion. The hemorrhoids were small. Complications:            No immediate complications. Estimated Blood Loss:     Estimated blood loss: none. Impression:               - One 3 mm polyp in the transverse colon, removed                            with a cold snare. Resected and retrieved.                           - Diverticulosis in the sigmoid colon.                           - Small external and internal hemorrhoids. Recommendation:           - Patient has a contact number available for                            emergencies. The signs and symptoms of potential                            delayed complications were discussed with the                            patient. Return to normal activities tomorrow.                            Written discharge instructions were provided to  the                            patient.                           - Resume previous diet.                           - Continue present medications.                           - Await pathology results.                           - Repeat colonoscopy is recommended. The                            colonoscopy date will be determined after pathology                            results from today's exam become available for                            review. Jerene Bears, MD 09/04/2022 9:04:59 AM This report has been signed  electronically.

## 2022-09-04 NOTE — Progress Notes (Signed)
Vss nAD TRANS TO PACU

## 2022-09-04 NOTE — Progress Notes (Signed)
Called to room to assist during endoscopic procedure.  Patient ID and intended procedure confirmed with present staff. Received instructions for my participation in the procedure from the performing physician.  

## 2022-09-04 NOTE — Progress Notes (Signed)
GASTROENTEROLOGY PROCEDURE H&P NOTE   Primary Care Physician: McLean-Scocuzza, Nino Glow, MD    Reason for Procedure:  Colon cancer screening  Plan:    Colonoscopy  Patient is appropriate for endoscopic procedure(s) in the ambulatory ([facility]) setting.  The nature of the procedure, as well as the risks, benefits, and alternatives were carefully and thoroughly reviewed with the patient. Ample time for discussion and questions allowed. The patient understood, was satisfied, and agreed to proceed.     HPI: Danielle Sparks is a 61 y.o. female who presents for colonoscopy.  Medical history as below.  Tolerated the prep.  No recent chest pain or shortness of breath.  No abdominal pain today.  Past Medical History:  Diagnosis Date   Anxiety    Arthritis    HAND   Breast cancer ([place]) 09/2012   chemo and radiation right   Bunion    Cancer ([facility])    breast   COVID-19    06/08/22   Depression    Hyperlipidemia    Hypertension    Osteopenia    Other vitamin B12 deficiency anemia    Other vitamin B12 deficiency anemia    Personal history of chemotherapy    Personal history of radiation therapy    Postmenopausal atrophic vaginitis    Shingles    right flank    Past Surgical History:  Procedure Laterality Date   ABDOMINAL HYSTERECTOMY     BREAST BIOPSY Left 2002   stereotactic   breast clip     left breast   BREAST EXCISIONAL BIOPSY Right 2014   positive   BUNIONECTOMY     bilateral   COLONOSCOPY     GANGLION CYST EXCISION  01/09/2010   Removed    PORTA CATH INSERTION     X2   TUBAL LIGATION     WISDOM TOOTH EXTRACTION      Prior to Admission medications   Medication Sig Start Date End Date Taking? Authorizing Provider  amLODipine (NORVASC) 5 MG tablet Take 1 tablet (5 mg total) by mouth daily. 06/28/22  Yes McLean-Scocuzza, Nino Glow, MD  buPROPion (WELLBUTRIN) 75 MG tablet Take 150 mg by mouth 2 (two) times daily.  03/25/13  Yes [provider]   calcium-vitamin D (OSCAL WITH D) 500-200 MG-UNIT tablet Take 1 tablet by mouth.   Yes [provider]  cholecalciferol (VITAMIN D) 400 units TABS tablet Take 400 Units by mouth.   Yes [provider]  Cyanocobalamin (B-12 PO) Take 400 mcg by mouth daily at 12 noon.   Yes [provider]  ezetimibe (ZETIA) 10 MG tablet Take 1 tablet (10 mg total) by mouth daily. 06/28/22  Yes McLean-Scocuzza, Nino Glow, MD  lamoTRIgine (LAMICTAL) 100 MG tablet Take 1 tablet (100 mg total) by mouth daily. 09/18/18  Yes McLean-Scocuzza, Nino Glow, MD  omeprazole (PRILOSEC OTC) 20 MG tablet Take 20 mg by mouth daily.   Yes [provider]  sertraline (ZOLOFT) 100 MG tablet Take 150 mg by mouth daily.   Yes [provider]  telmisartan (MICARDIS) 80 MG tablet Take 0.5 tablets (40 mg total) by mouth daily. TAKE ONE TABLET (40 MG) BY MOUTH EVERY MORNING if needed take 1 pill if BP not <130/<80 06/28/22  Yes McLean-Scocuzza, Nino Glow, MD  triamterene-hydrochlorothiazide (MAXZIDE-25) 37.5-25 MG tablet Take 1 tablet by mouth daily. In am 06/28/22  Yes McLean-Scocuzza, Nino Glow, MD  fluticasone (FLONASE) 50 MCG/ACT nasal spray Place 2 sprays into both nostrils daily. Patient  not taking: Reported on 08/06/2022 06/28/22   McLean-Scocuzza, Nino Glow, MD  hydrocortisone 2.5 % cream Apply topically 2 (two) times daily as needed. 04/25/21   McLean-Scocuzza, Nino Glow, MD  meloxicam (MOBIC) 7.5 MG tablet Take 1 tablet (7.5 mg total) by mouth daily as needed for pain. 03/19/19   McLean-Scocuzza, Nino Glow, MD  methocarbamol (ROBAXIN) 500 MG tablet Take 500 mg by mouth daily as needed for muscle spasms.    [provider]  mupirocin ointment (BACTROBAN) 2 % Apply 1 application. topically 2 (two) times daily. Prn 12/27/21   McLean-Scocuzza, Nino Glow, MD  phentermine (ADIPEX-P) 37.5 MG tablet TAKE ONE TABLET BY MOUTH EVERY DAY BEFORE BREAKFAST 03/23/22   McLean-Scocuzza, Nino Glow, MD  phentermine (ADIPEX-P)  37.5 MG tablet Take 1 tablet (37.5 mg total) by mouth daily before breakfast. 06/28/22   McLean-Scocuzza, Nino Glow, MD  sodium chloride (OCEAN) 0.65 % SOLN nasal spray Place 2 sprays into both nostrils as needed for congestion. 06/28/22   McLean-Scocuzza, Nino Glow, MD    Current Outpatient Medications  Medication Sig Dispense Refill   amLODipine (NORVASC) 5 MG tablet Take 1 tablet (5 mg total) by mouth daily. 90 tablet 3   buPROPion (WELLBUTRIN) 75 MG tablet Take 150 mg by mouth 2 (two) times daily.      calcium-vitamin D (OSCAL WITH D) 500-200 MG-UNIT tablet Take 1 tablet by mouth.     cholecalciferol (VITAMIN D) 400 units TABS tablet Take 400 Units by mouth.     Cyanocobalamin (B-12 PO) Take 400 mcg by mouth daily at 12 noon.     ezetimibe (ZETIA) 10 MG tablet Take 1 tablet (10 mg total) by mouth daily. 90 tablet 3   lamoTRIgine (LAMICTAL) 100 MG tablet Take 1 tablet (100 mg total) by mouth daily.     omeprazole (PRILOSEC OTC) 20 MG tablet Take 20 mg by mouth daily.     sertraline (ZOLOFT) 100 MG tablet Take 150 mg by mouth daily.     telmisartan (MICARDIS) 80 MG tablet Take 0.5 tablets (40 mg total) by mouth daily. TAKE ONE TABLET (40 MG) BY MOUTH EVERY MORNING if needed take 1 pill if BP not <130/<80 90 tablet 3   triamterene-hydrochlorothiazide (MAXZIDE-25) 37.5-25 MG tablet Take 1 tablet by mouth daily. In am 90 tablet 3   fluticasone (FLONASE) 50 MCG/ACT nasal spray Place 2 sprays into both nostrils daily. (Patient not taking: Reported on 08/06/2022) 48 g 3   hydrocortisone 2.5 % cream Apply topically 2 (two) times daily as needed. 28.35 g 11   meloxicam (MOBIC) 7.5 MG tablet Take 1 tablet (7.5 mg total) by mouth daily as needed for pain. 90 tablet 3   methocarbamol (ROBAXIN) 500 MG tablet Take 500 mg by mouth daily as needed for muscle spasms.     mupirocin ointment (BACTROBAN) 2 % Apply 1 application. topically 2 (two) times daily. Prn 30 g 0   phentermine (ADIPEX-P) 37.5 MG tablet TAKE  ONE TABLET BY MOUTH EVERY DAY BEFORE BREAKFAST 60 tablet 0   phentermine (ADIPEX-P) 37.5 MG tablet Take 1 tablet (37.5 mg total) by mouth daily before breakfast. 60 tablet 0   sodium chloride (OCEAN) 0.65 % SOLN nasal spray Place 2 sprays into both nostrils as needed for congestion. 90 mL 3   Current Facility-Administered Medications  Medication Dose Route Frequency Provider Last Rate Last Admin   0.9 %  sodium chloride infusion  500 mL Intravenous Once Sahiti Joswick, Lajuan Lines, MD  Allergies as of 09/04/2022 - Review Complete 09/04/2022  Allergen Reaction Noted   Sulfa antibiotics Itching 06/07/2011   Xanax [alprazolam] Other (See Comments) 01/12/2013    Family History  Problem Relation Age of Onset   Skin cancer Mother    Heart disease Mother        afib   Hyperlipidemia Mother    Colon polyps Father    Heart disease Father    Skin cancer Father    Prostate cancer Father    COPD Father    Hyperlipidemia Sister    Breast cancer Paternal Aunt 10   Breast cancer Cousin 16   Colon cancer Neg Hx    Rectal cancer Neg Hx    Stomach cancer Neg Hx    Crohn's disease Neg Hx    Esophageal cancer Neg Hx    Ulcerative colitis Neg Hx     Social History   Socioeconomic History   Marital status: Married    Spouse name: Not on file   Number of children: Not on file   Years of education: Not on file   Highest education level: Not on file  Occupational History   Not on file  Tobacco Use   Smoking status: Never    Passive exposure: Never   Smokeless tobacco: Never  Vaping Use   Vaping Use: Never used  Substance and Sexual Activity   Alcohol use: Yes    Comment: occasional glass of wine   Drug use: No   Sexual activity: Not on file  Other Topics Concern   Not on file  Social History Narrative   Married    2 kids    Used to own Public affairs consultant    Social Determinants of Health   Financial Resource Strain: Not on file  Food Insecurity: Not on file  Transportation Needs:  Not on file  Physical Activity: Not on file  Stress: Not on file  Social Connections: Not on file  Intimate Partner Violence: Not on file    Physical Exam: Vital signs in last 24 hours: '@BP'$  (!) 148/86   Pulse 66   Temp 97.9 F (36.6 C)   Ht '5\' 6"'$  (1.676 m)   Wt 162 lb (73.5 kg)   SpO2 97%   BMI 26.15 kg/m  GEN: NAD EYE: Sclerae anicteric ENT: MMM CV: Non-tachycardic Pulm: CTA b/l GI: Soft, NT/ND NEURO:  Alert & Oriented x 3   Zenovia Jarred, MD [facility]  09/04/2022 8:27 AM

## 2022-09-05 ENCOUNTER — Telehealth: Payer: Self-pay

## 2022-09-05 NOTE — Telephone Encounter (Signed)
Post procedure follow up call, no answer 

## 2022-09-07 ENCOUNTER — Encounter: Payer: Self-pay | Admitting: Internal Medicine

## 2022-09-10 DIAGNOSIS — F411 Generalized anxiety disorder: Secondary | ICD-10-CM | POA: Diagnosis not present

## 2022-09-10 DIAGNOSIS — R69 Illness, unspecified: Secondary | ICD-10-CM | POA: Diagnosis not present

## 2022-09-26 DIAGNOSIS — R69 Illness, unspecified: Secondary | ICD-10-CM | POA: Diagnosis not present

## 2022-09-26 DIAGNOSIS — L570 Actinic keratosis: Secondary | ICD-10-CM | POA: Diagnosis not present

## 2022-10-02 DIAGNOSIS — R69 Illness, unspecified: Secondary | ICD-10-CM | POA: Diagnosis not present

## 2022-10-09 DIAGNOSIS — R69 Illness, unspecified: Secondary | ICD-10-CM | POA: Diagnosis not present

## 2022-10-23 DIAGNOSIS — R69 Illness, unspecified: Secondary | ICD-10-CM | POA: Diagnosis not present

## 2022-11-06 ENCOUNTER — Inpatient Hospital Stay: Payer: 59 | Attending: Internal Medicine

## 2022-11-06 ENCOUNTER — Inpatient Hospital Stay: Payer: 59

## 2022-11-06 DIAGNOSIS — F32A Depression, unspecified: Secondary | ICD-10-CM | POA: Diagnosis not present

## 2022-11-06 DIAGNOSIS — Z853 Personal history of malignant neoplasm of breast: Secondary | ICD-10-CM | POA: Diagnosis not present

## 2022-11-06 DIAGNOSIS — G8929 Other chronic pain: Secondary | ICD-10-CM | POA: Diagnosis not present

## 2022-11-06 DIAGNOSIS — M2550 Pain in unspecified joint: Secondary | ICD-10-CM | POA: Insufficient documentation

## 2022-11-06 DIAGNOSIS — M8580 Other specified disorders of bone density and structure, unspecified site: Secondary | ICD-10-CM | POA: Insufficient documentation

## 2022-11-06 DIAGNOSIS — Z170 Estrogen receptor positive status [ER+]: Secondary | ICD-10-CM | POA: Insufficient documentation

## 2022-11-06 LAB — CBC WITH DIFFERENTIAL/PLATELET
Abs Immature Granulocytes: 0.03 10*3/uL (ref 0.00–0.07)
Basophils Absolute: 0.1 10*3/uL (ref 0.0–0.1)
Basophils Relative: 1 %
Eosinophils Absolute: 0.1 10*3/uL (ref 0.0–0.5)
Eosinophils Relative: 1 %
HCT: 35.7 % — ABNORMAL LOW (ref 36.0–46.0)
Hemoglobin: 12 g/dL (ref 12.0–15.0)
Immature Granulocytes: 0 %
Lymphocytes Relative: 20 %
Lymphs Abs: 1.4 10*3/uL (ref 0.7–4.0)
MCH: 31.6 pg (ref 26.0–34.0)
MCHC: 33.6 g/dL (ref 30.0–36.0)
MCV: 93.9 fL (ref 80.0–100.0)
Monocytes Absolute: 0.5 10*3/uL (ref 0.1–1.0)
Monocytes Relative: 7 %
Neutro Abs: 5.1 10*3/uL (ref 1.7–7.7)
Neutrophils Relative %: 71 %
Platelets: 261 10*3/uL (ref 150–400)
RBC: 3.8 MIL/uL — ABNORMAL LOW (ref 3.87–5.11)
RDW: 13.4 % (ref 11.5–15.5)
WBC: 7.2 10*3/uL (ref 4.0–10.5)
nRBC: 0 % (ref 0.0–0.2)

## 2022-11-06 LAB — COMPREHENSIVE METABOLIC PANEL
ALT: 27 U/L (ref 0–44)
AST: 26 U/L (ref 15–41)
Albumin: 4.5 g/dL (ref 3.5–5.0)
Alkaline Phosphatase: 75 U/L (ref 38–126)
Anion gap: 10 (ref 5–15)
BUN: 15 mg/dL (ref 8–23)
CO2: 26 mmol/L (ref 22–32)
Calcium: 9.4 mg/dL (ref 8.9–10.3)
Chloride: 98 mmol/L (ref 98–111)
Creatinine, Ser: 0.83 mg/dL (ref 0.44–1.00)
GFR, Estimated: >60 mL/min (ref >60)
Glucose, Bld: 92 mg/dL (ref 70–99)
Potassium: 3.8 mmol/L (ref 3.5–5.1)
Sodium: 134 mmol/L — ABNORMAL LOW (ref 135–145)
Total Bilirubin: 0.6 mg/dL (ref 0.3–1.2)
Total Protein: 7.2 g/dL (ref 6.5–8.1)

## 2022-11-07 LAB — CANCER ANTIGEN 27.29: CA 27.29: <9 U/mL (ref 0.0–38.6)

## 2022-11-08 ENCOUNTER — Inpatient Hospital Stay: Payer: 59 | Admitting: Internal Medicine

## 2022-11-08 ENCOUNTER — Encounter: Payer: Self-pay | Admitting: Internal Medicine

## 2022-11-08 VITALS — BP 129/84 | HR 69 | Temp 97.4°F | Resp 18 | Wt 177.1 lb

## 2022-11-08 DIAGNOSIS — M8580 Other specified disorders of bone density and structure, unspecified site: Secondary | ICD-10-CM | POA: Diagnosis not present

## 2022-11-08 DIAGNOSIS — C50811 Malignant neoplasm of overlapping sites of right female breast: Secondary | ICD-10-CM | POA: Diagnosis not present

## 2022-11-08 DIAGNOSIS — F32A Depression, unspecified: Secondary | ICD-10-CM | POA: Diagnosis not present

## 2022-11-08 DIAGNOSIS — M2550 Pain in unspecified joint: Secondary | ICD-10-CM | POA: Diagnosis not present

## 2022-11-08 DIAGNOSIS — R69 Illness, unspecified: Secondary | ICD-10-CM | POA: Diagnosis not present

## 2022-11-08 DIAGNOSIS — Z853 Personal history of malignant neoplasm of breast: Secondary | ICD-10-CM | POA: Diagnosis not present

## 2022-11-08 DIAGNOSIS — G8929 Other chronic pain: Secondary | ICD-10-CM | POA: Diagnosis not present

## 2022-11-08 DIAGNOSIS — Z170 Estrogen receptor positive status [ER+]: Secondary | ICD-10-CM

## 2022-11-08 NOTE — Progress Notes (Signed)
Patient denies new problems/concerns today.   

## 2022-11-08 NOTE — Progress Notes (Signed)
[facility] OFFICE PROGRESS NOTE  Patient Care Team: Pcp, No as PCP - General Cammie Sickle, MD as Consulting Physician (Internal Medicine)   SUMMARY OF HEMATOLOGIC/ONCOLOGIC HISTORY:  Oncology History Overview Note  # FEB 2014- BREAST CANCER STAGE II [T1N1; 1.3cm 1/12LN pos; extranodal extension; Dr.Smith ]ER/PR-POS; Her 2 NEU-NEG. ; dd AC- Taxol weeklyx12 S/p RT; NOV 2014- Start Femara; Stop Dec 2016; MAY 2017- START Aromasin; STOPPED in [place] 2016 [joint pains]  # Chronic depression [Dr.Kapur]  # my RISK- NEG for deleterious mutation; VUS- BRIP1 gene   Breast cancer, right breast ([address])  01/12/2013 Initial Diagnosis   Breast cancer, right breast ([place])   Carcinoma of overlapping sites of right breast in female, estrogen receptor positive [facility])   Radiation Therapy    The patient saw No care team member to display for radiation treatment. This is the current list of radiation treatment: Radiation Treatments     Patient's record has no active or historical radiation treatments documented.         INTERVAL HISTORY: Patient ambulating-independently.  Alone/.   A very pleasant 62 -year-old female patient with above history of stage II breast cancer 2014 currently on surveillance is here for follow-up.  Patient gaining weight.  Patient continues to follow-up closely with psychiatry for depression.   Mild to moderate fatigue.  Chronic joint pains.  Not any worse.  Review of Systems  Constitutional:  Negative for chills, diaphoresis, fever, malaise/fatigue and weight loss.  HENT:  Negative for nosebleeds and sore throat.   Eyes:  Negative for double vision.  Respiratory:  Negative for cough, hemoptysis, sputum production, shortness of breath and wheezing.   Cardiovascular:  Negative for chest pain, palpitations, orthopnea and leg swelling.  Gastrointestinal:  Negative for abdominal pain, blood in stool, constipation, diarrhea, heartburn, melena, nausea and  vomiting.  Genitourinary:  Negative for dysuria, frequency and urgency.  Musculoskeletal:  Positive for back pain and joint pain.  Skin: Negative.  Negative for itching and rash.  Neurological:  Negative for dizziness, tingling, focal weakness, weakness and headaches.  Endo/Heme/Allergies:  Does not bruise/bleed easily.  Psychiatric/Behavioral:  Positive for depression. The patient is nervous/anxious. The patient does not have insomnia.      PAST MEDICAL HISTORY :  Past Medical History:  Diagnosis Date   Anxiety    Arthritis    HAND   Breast cancer ([place]) 09/2012   chemo and radiation right   Bunion    Cancer ([place])    breast   COVID-19    06/08/22   Depression    Hyperlipidemia    Hypertension    Osteopenia    Other vitamin B12 deficiency anemia    Other vitamin B12 deficiency anemia    Personal history of chemotherapy    Personal history of radiation therapy    Postmenopausal atrophic vaginitis    Shingles    right flank    PAST SURGICAL HISTORY :   Past Surgical History:  Procedure Laterality Date   ABDOMINAL HYSTERECTOMY     BREAST BIOPSY Left 2002   stereotactic   breast clip     left breast   BREAST EXCISIONAL BIOPSY Right 2014   positive   BUNIONECTOMY     bilateral   COLONOSCOPY     GANGLION CYST EXCISION  01/09/2010   Removed    PORTA CATH INSERTION     X2   TUBAL LIGATION     WISDOM TOOTH EXTRACTION  FAMILY HISTORY :   Family History  Problem Relation Age of Onset   Skin cancer Mother    Heart disease Mother        afib   Hyperlipidemia Mother    Colon polyps Father    Heart disease Father    Skin cancer Father    Prostate cancer Father    COPD Father    Hyperlipidemia Sister    Breast cancer Paternal Aunt 65   Breast cancer Cousin 89   Colon cancer Neg Hx    Rectal cancer Neg Hx    Stomach cancer Neg Hx    Crohn's disease Neg Hx    Esophageal cancer Neg Hx    Ulcerative colitis Neg Hx     SOCIAL HISTORY:   Social History    Tobacco Use   Smoking status: Never    Passive exposure: Never   Smokeless tobacco: Never  Vaping Use   Vaping Use: Never used  Substance Use Topics   Alcohol use: Yes    Comment: occasional glass of wine   Drug use: No    ALLERGIES:  is allergic to sulfa antibiotics and xanax [alprazolam].  MEDICATIONS:  Current Outpatient Medications  Medication Sig Dispense Refill   amLODipine (NORVASC) 5 MG tablet Take 1 tablet (5 mg total) by mouth daily. 90 tablet 3   buPROPion (WELLBUTRIN) 75 MG tablet Take 150 mg by mouth 2 (two) times daily.      calcium-vitamin D (OSCAL WITH D) 500-200 MG-UNIT tablet Take 1 tablet by mouth.     cholecalciferol (VITAMIN D) 400 units TABS tablet Take 400 Units by mouth.     Cyanocobalamin (B-12 PO) Take 400 mcg by mouth daily at 12 noon.     ezetimibe (ZETIA) 10 MG tablet Take 1 tablet (10 mg total) by mouth daily. 90 tablet 3   hydrocortisone 2.5 % cream Apply topically 2 (two) times daily as needed. 28.35 g 11   lamoTRIgine (LAMICTAL) 100 MG tablet Take 1 tablet (100 mg total) by mouth daily.     methocarbamol (ROBAXIN) 500 MG tablet Take 500 mg by mouth daily as needed for muscle spasms.     mupirocin ointment (BACTROBAN) 2 % Apply 1 application. topically 2 (two) times daily. Prn 30 g 0   omeprazole (PRILOSEC OTC) 20 MG tablet Take 20 mg by mouth daily.     sertraline (ZOLOFT) 100 MG tablet Take 150 mg by mouth daily.     sodium chloride (OCEAN) 0.65 % SOLN nasal spray Place 2 sprays into both nostrils as needed for congestion. 90 mL 3   telmisartan (MICARDIS) 80 MG tablet Take 0.5 tablets (40 mg total) by mouth daily. TAKE ONE TABLET (40 MG) BY MOUTH EVERY MORNING if needed take 1 pill if BP not <130/<80 90 tablet 3   triamterene-hydrochlorothiazide (MAXZIDE-25) 37.5-25 MG tablet Take 1 tablet by mouth daily. In am 90 tablet 3   fluticasone (FLONASE) 50 MCG/ACT nasal spray Place 2 sprays into both nostrils daily. (Patient not taking: Reported on  08/06/2022) 48 g 3   meloxicam (MOBIC) 7.5 MG tablet Take 1 tablet (7.5 mg total) by mouth daily as needed for pain. (Patient not taking: Reported on 11/08/2022) 90 tablet 3   phentermine (ADIPEX-P) 37.5 MG tablet TAKE ONE TABLET BY MOUTH EVERY DAY BEFORE BREAKFAST 60 tablet 0   phentermine (ADIPEX-P) 37.5 MG tablet Take 1 tablet (37.5 mg total) by mouth daily before breakfast. (Patient not taking: Reported on 11/08/2022) 60 tablet 0  No current facility-administered medications for this visit.    PHYSICAL EXAMINATION: ECOG PERFORMANCE STATUS: 0 - Asymptomatic  BP 129/84 (BP Location: Left Arm, Patient Position: Sitting)   Pulse 69   Temp (!) 97.4 F (36.3 C) (Tympanic)   Resp 18   Wt 177 lb 1.6 oz (80.3 kg)   BMI 28.58 kg/m   Filed Weights   11/08/22 1000  Weight: 177 lb 1.6 oz (80.3 kg)   Right and left BREAST exam [in the presence of nurse]-right breast exam no unusual skin changes; upper outer quadrant-vague mass felt.  Mild tenderness.  Surgical scars noted.  Left breast exam normal.   Physical Exam HENT:     Head: Normocephalic and atraumatic.     Mouth/Throat:     Pharynx: No oropharyngeal exudate.  Eyes:     Pupils: Pupils are equal, round, and reactive to light.  Cardiovascular:     Rate and Rhythm: Normal rate and regular rhythm.  Pulmonary:     Effort: No respiratory distress.     Breath sounds: No wheezing.  Abdominal:     General: Bowel sounds are normal. There is no distension.     Palpations: Abdomen is soft. There is no mass.     Tenderness: There is no abdominal tenderness. There is no guarding or rebound.  Musculoskeletal:        General: No tenderness. Normal range of motion.     Cervical back: Normal range of motion and neck supple.  Skin:    General: Skin is warm.     Comments: Right and left BREAST exam [in the presence of nurse]- no unusual skin changes or dominant masses felt. Surgical scars noted.    Neurological:     Mental Status: She is  alert and oriented to person, place, and time.  Psychiatric:        Mood and Affect: Affect normal.      LABORATORY DATA:  I have reviewed the data as listed    Component Value Date/Time   NA 134 (L) 11/06/2022 1535   NA 136 08/30/2014 1116   K 3.8 11/06/2022 1535   K 3.9 08/30/2014 1116   CL 98 11/06/2022 1535   CL 98 08/30/2014 1116   CO2 26 11/06/2022 1535   CO2 29 08/30/2014 1116   GLUCOSE 92 11/06/2022 1535   GLUCOSE 115 (H) 08/30/2014 1116   BUN 15 11/06/2022 1535   BUN 13 08/30/2014 1116   CREATININE 0.83 11/06/2022 1535   CREATININE 0.76 07/10/2018 0822   CALCIUM 9.4 11/06/2022 1535   CALCIUM 9.9 08/30/2014 1116   PROT 7.2 11/06/2022 1535   PROT 7.1 08/30/2014 1116   ALBUMIN 4.5 11/06/2022 1535   ALBUMIN 4.2 08/30/2014 1116   AST 26 11/06/2022 1535   AST 21 08/30/2014 1116   ALT 27 11/06/2022 1535   ALT 31 08/30/2014 1116   ALKPHOS 75 11/06/2022 1535   ALKPHOS 81 08/30/2014 1116   BILITOT 0.6 11/06/2022 1535   BILITOT 0.6 08/30/2014 1116   GFRNONAA >60 11/06/2022 1535   GFRNONAA >60 08/30/2014 1116   GFRNONAA >60 11/11/2013 1443   GFRAA >60 08/25/2019 0824   GFRAA >60 08/30/2014 1116   GFRAA >60 11/11/2013 1443    No results found for: "SPEP", "UPEP"  Lab Results  Component Value Date   WBC 7.2 11/06/2022   NEUTROABS 5.1 11/06/2022   HGB 12.0 11/06/2022   HCT 35.7 (L) 11/06/2022   MCV 93.9 11/06/2022   PLT 261 11/06/2022  Chemistry      Component Value Date/Time   NA 134 (L) 11/06/2022 1535   NA 136 08/30/2014 1116   K 3.8 11/06/2022 1535   K 3.9 08/30/2014 1116   CL 98 11/06/2022 1535   CL 98 08/30/2014 1116   CO2 26 11/06/2022 1535   CO2 29 08/30/2014 1116   BUN 15 11/06/2022 1535   BUN 13 08/30/2014 1116   CREATININE 0.83 11/06/2022 1535   CREATININE 0.76 07/10/2018 0822      Component Value Date/Time   CALCIUM 9.4 11/06/2022 1535   CALCIUM 9.9 08/30/2014 1116   ALKPHOS 75 11/06/2022 1535   ALKPHOS 81 08/30/2014 1116   AST  26 11/06/2022 1535   AST 21 08/30/2014 1116   ALT 27 11/06/2022 1535   ALT 31 08/30/2014 1116   BILITOT 0.6 11/06/2022 1535   BILITOT 0.6 08/30/2014 1116        ASSESSMENT & PLAN:   Carcinoma of overlapping sites of right breast in female, estrogen receptor positive ([place]) # Stage II breast cancer ER/PR positive HER-2/neu negative [Dx-2016]-currently off arimidex since dec 2016 [sec to SEs].   Stable. Clinically no evidence of recurrence. Mammogram Feb 20th 2023- WNL;  screening mammogram 2024- pending. .  # chronic joint pains-  Continue vitamin D calcium and/chondroitin.  Vitamin D glucosamine chondroitin-stable  # Depression/bipolar-STABLE; zoloft/wellbutrin/Lamictal-better controlled  [Dr. Nicolasa Ducking.]. stable  # OSTEOPENIA- DEC 2016- BMD -2.3;  On ca+vit D;2022- T-score of -1.8- stable.   I reviewed the bone density.  Will repeat bone density again in 2025.  # DISPOSITION: # mammogram Bil screening march 2024;  # 12 months-MD; labs-cbc/cmp/ca-27-29 Few days prior; DEXA scan- Dr.B      Cammie Sickle, MD 11/08/2022 12:28 PM

## 2022-11-08 NOTE — Assessment & Plan Note (Addendum)
#   Stage II breast cancer ER/PR positive HER-2/neu negative [Dx-2016]-currently off arimidex since dec 2016 [sec to SEs].   Stable. Clinically no evidence of recurrence. Mammogram Feb 20th 2023- WNL;  screening mammogram 2024- pending. .  # chronic joint pains-  Continue vitamin D calcium and/chondroitin.  Vitamin D glucosamine chondroitin-stable  # Depression/bipolar-STABLE; zoloft/wellbutrin/Lamictal-better controlled  [Dr. Nicolasa Ducking.]. stable  # OSTEOPENIA- DEC 2016- BMD -2.3;  On ca+vit D;2022- T-score of -1.8- stable.   I reviewed the bone density.  Will repeat bone density again in 2025.  # DISPOSITION: # mammogram Bil screening march 2024;  # 12 months-MD; labs-cbc/cmp/ca-27-29 Few days prior; DEXA scan- Dr.B

## 2022-11-12 NOTE — Patient Instructions (Incomplete)
It was a pleasure meeting you today. Thank you for allowing me to take part in your health care.  Our goals for today as we discussed include:  Your telmisartan dose is 40 mg.  You should be taking half of the 80 mg tablet that was dispensed   Schedule annual physical in 6 months Schedule lab appointment 1 week prior to visit.  Will need to fast for 10 hours before lab  If you have any questions or concerns, please do not hesitate to call the office at [phone].  I look forward to our next visit and until then take care and stay safe.  Regards,   Dana Allan, MD   [facility]

## 2022-11-12 NOTE — Progress Notes (Signed)
SUBJECTIVE:   Chief Complaint  Patient presents with   Transitions Of Care    Bladder leakage/refills   HPI Presents to clinic to transfer care  No acute concerns.  Mood disorder. Doing well on current medication.  Follows with Dr. Nicolasa Ducking.  Currently taking Wellbutrin 150 mg twice daily, lamotrigine 100 mg daily, sertraline 150 mg daily.  Denies any SI/HI.  Follows with therapist.  Hypertension Asymptomatic.  Has not taken blood pressure medications today.  Reports blood pressure at home good but does not give reading.  Currently takes Norvasc 5 mg daily, Micardis 80 mg daily, Maxide-25 1 tablet daily.  Prescribed dose of Micardis is 40 mg daily.  Stress incontinence Patient reports recent issue with urinary leakage during coughing, sneezing or activity.  Denies any abdominal pain, fevers, dysuria or hematuria or vaginal discharge.  Not currently sexually active.  PERTINENT PMH / PSH: Hypertension OSA Hyperlipidemia History of right breast carcinoma, estrogen receptor positive Stress incontinence Prediabetes Short-term memory loss OBJECTIVE:  BP 118/78   Pulse 66   Temp 97.7 F (36.5 C) (Oral)   Ht '5\' 6"'$  (1.676 m)   Wt 171 lb 3.2 oz (77.7 kg)   SpO2 99%   BMI 27.63 kg/m    Physical Exam Vitals reviewed.  Constitutional:      General: She is not in acute distress.    Appearance: She is normal weight. She is not ill-appearing.  HENT:     Head: Normocephalic.  Eyes:     Conjunctiva/sclera: Conjunctivae normal.  Neck:     Thyroid: No thyromegaly or thyroid tenderness.  Cardiovascular:     Rate and Rhythm: Normal rate and regular rhythm.     Pulses: Normal pulses.  Pulmonary:     Effort: Pulmonary effort is normal.     Breath sounds: Normal breath sounds.  Abdominal:     General: Bowel sounds are normal.  Neurological:     Mental Status: She is alert. Mental status is at baseline.  Psychiatric:        Mood and Affect: Mood normal.        Behavior: Behavior  normal.        Thought Content: Thought content normal.        Judgment: Judgment normal.     ASSESSMENT/PLAN:  Primary hypertension Assessment & Plan: Chronic.  Stable.  Controlled. Patient to check on current dosing of telmisartan.  Correct dose to be 40 mg at night Continue Maxide-25 daily Continue amlodipine 5 mg daily Continue telmisartan 40 mg daily If blood pressure remains less than 130/80 plan to wean medication starting with diuretics and increased stress incontinence Follow-up in 6 months.  Orders: -     Telmisartan; Take 1 tablet (40 mg total) by mouth daily. -     Comprehensive metabolic panel; Future -     Vitamin B12; Future  Mood disorder (HCC) Assessment & Plan: Chronic.  Stable. Continue Wellbutrin 150 mg twice daily Continue lamotrigine 100 mg daily Continue sertraline 150 mg daily Follow-up with therapist as scheduled Follow with Dr. Nicolasa Ducking as scheduled    Stress incontinence of urine Assessment & Plan: Chronic.  Stable. Recommend Kegel exercises daily Follow-up in 4 to 6 weeks if no improvement   Carcinoma of overlapping sites of right breast in female, estrogen receptor positive ([place]) Assessment & Plan: Chronic.  Stable. Follows with oncology.   Prediabetes Assessment & Plan: Chronic.  Elevated BMI.  Weight decreased from last visit Checking A1c  Orders: -  Hemoglobin A1c; Future  B12 deficiency Assessment & Plan: Chronic.  On supplements.   Check vitamin B12 levels  Orders: -     Vitamin B12; Future  Hyperlipidemia with target low density lipoprotein (LDL) cholesterol less than 100 mg/dL Assessment & Plan: Chronic.  Stable. Currently on Zetia 10 mg daily   Orders: -     Lipid panel; Future  Obesity (BMI 30-39.9) -     CBC with Differential/Platelet; Future -     VITAMIN D 25 Hydroxy (Vit-D Deficiency, Fractures); Future -     TSH; Future  Intertrigo Assessment & Plan: Hydrocortisone 2.5% twice daily as  needed    PDMP reviewed  Return in about 6 months (around 05/14/2023) for annual visit with fasting labs 1 week prior.  Carollee Leitz, MD

## 2022-11-13 ENCOUNTER — Encounter: Payer: Self-pay | Admitting: Family Medicine

## 2022-11-13 ENCOUNTER — Ambulatory Visit: Payer: 59 | Admitting: Family Medicine

## 2022-11-13 VITALS — BP 118/78 | HR 66 | Temp 97.7°F | Ht 66.0 in | Wt 171.2 lb

## 2022-11-13 DIAGNOSIS — N393 Stress incontinence (female) (male): Secondary | ICD-10-CM | POA: Diagnosis not present

## 2022-11-13 DIAGNOSIS — E669 Obesity, unspecified: Secondary | ICD-10-CM

## 2022-11-13 DIAGNOSIS — L304 Erythema intertrigo: Secondary | ICD-10-CM | POA: Diagnosis not present

## 2022-11-13 DIAGNOSIS — C50811 Malignant neoplasm of overlapping sites of right female breast: Secondary | ICD-10-CM

## 2022-11-13 DIAGNOSIS — I10 Essential (primary) hypertension: Secondary | ICD-10-CM | POA: Diagnosis not present

## 2022-11-13 DIAGNOSIS — Z170 Estrogen receptor positive status [ER+]: Secondary | ICD-10-CM

## 2022-11-13 DIAGNOSIS — F39 Unspecified mood [affective] disorder: Secondary | ICD-10-CM | POA: Diagnosis not present

## 2022-11-13 DIAGNOSIS — E785 Hyperlipidemia, unspecified: Secondary | ICD-10-CM | POA: Diagnosis not present

## 2022-11-13 DIAGNOSIS — R69 Illness, unspecified: Secondary | ICD-10-CM | POA: Diagnosis not present

## 2022-11-13 DIAGNOSIS — R7303 Prediabetes: Secondary | ICD-10-CM

## 2022-11-13 DIAGNOSIS — E538 Deficiency of other specified B group vitamins: Secondary | ICD-10-CM | POA: Diagnosis not present

## 2022-11-13 MED ORDER — TELMISARTAN 40 MG PO TABS: 40.0000 mg | ORAL_TABLET | Freq: Every day | ORAL | Status: DC

## 2022-11-13 NOTE — Assessment & Plan Note (Signed)
Chronic.  On supplements.   Check vitamin B12 levels

## 2022-11-13 NOTE — Assessment & Plan Note (Signed)
>>  ASSESSMENT AND PLAN FOR HYPERLIPIDEMIA WITH TARGET LOW DENSITY LIPOPROTEIN (LDL) CHOLESTEROL LESS THAN 100 MG/DL WRITTEN ON 7/79/7975  8:91 PM BY WALSH, TANYA, MD  Chronic.  Stable. Currently on Zetia  10 mg daily

## 2022-11-13 NOTE — Assessment & Plan Note (Signed)
Chronic.  Stable. Currently on Zetia 10 mg daily

## 2022-11-13 NOTE — Assessment & Plan Note (Signed)
Hydrocortisone 2.5% twice daily as needed

## 2022-11-13 NOTE — Assessment & Plan Note (Signed)
Chronic.  Stable. Follows with oncology.

## 2022-11-13 NOTE — Assessment & Plan Note (Addendum)
Chronic.  Stable.  Controlled. Patient to check on current dosing of telmisartan.  Correct dose to be 40 mg at night Continue Maxide-25 daily Continue amlodipine 5 mg daily Continue telmisartan 40 mg daily If blood pressure remains less than 130/80 plan to wean medication starting with diuretics and increased stress incontinence Follow-up in 6 months.

## 2022-11-16 ENCOUNTER — Encounter: Payer: Self-pay | Admitting: Family Medicine

## 2022-11-16 NOTE — Assessment & Plan Note (Signed)
Chronic.  Stable. Continue Wellbutrin 150 mg twice daily Continue lamotrigine 100 mg daily Continue sertraline 150 mg daily Follow-up with therapist as scheduled Follow with Dr. Nicolasa Ducking as scheduled

## 2022-11-16 NOTE — Assessment & Plan Note (Signed)
Chronic.  Stable. Recommend Kegel exercises daily Follow-up in 4 to 6 weeks if no improvement

## 2022-11-16 NOTE — Assessment & Plan Note (Signed)
Chronic.  Elevated BMI.  Weight decreased from last visit Checking A1c

## 2022-11-21 DIAGNOSIS — F3481 Disruptive mood dysregulation disorder: Secondary | ICD-10-CM | POA: Diagnosis not present

## 2022-11-26 ENCOUNTER — Ambulatory Visit: Payer: 59

## 2022-12-04 DIAGNOSIS — F39 Unspecified mood [affective] disorder: Secondary | ICD-10-CM | POA: Diagnosis not present

## 2022-12-04 DIAGNOSIS — F411 Generalized anxiety disorder: Secondary | ICD-10-CM | POA: Diagnosis not present

## 2022-12-19 DIAGNOSIS — F3481 Disruptive mood dysregulation disorder: Secondary | ICD-10-CM | POA: Diagnosis not present

## 2023-01-07 DIAGNOSIS — G5603 Carpal tunnel syndrome, bilateral upper limbs: Secondary | ICD-10-CM | POA: Diagnosis not present

## 2023-01-22 ENCOUNTER — Ambulatory Visit
Admission: RE | Admit: 2023-01-22 | Discharge: 2023-01-22 | Disposition: A | Discharge status: Home / Self Care | Payer: 59 | Source: Ambulatory Visit | Attending: Internal Medicine | Admitting: Internal Medicine

## 2023-01-22 DIAGNOSIS — Z853 Personal history of malignant neoplasm of breast: Secondary | ICD-10-CM | POA: Diagnosis not present

## 2023-01-22 DIAGNOSIS — Z170 Estrogen receptor positive status [ER+]: Secondary | ICD-10-CM

## 2023-01-22 DIAGNOSIS — Z1231 Encounter for screening mammogram for malignant neoplasm of breast: Secondary | ICD-10-CM | POA: Insufficient documentation

## 2023-01-29 ENCOUNTER — Ambulatory Visit: Payer: 59

## 2023-02-19 DIAGNOSIS — K219 Gastro-esophageal reflux disease without esophagitis: Secondary | ICD-10-CM | POA: Diagnosis not present

## 2023-02-19 DIAGNOSIS — E669 Obesity, unspecified: Secondary | ICD-10-CM | POA: Diagnosis not present

## 2023-02-19 DIAGNOSIS — L309 Dermatitis, unspecified: Secondary | ICD-10-CM | POA: Diagnosis not present

## 2023-02-19 DIAGNOSIS — Z8249 Family history of ischemic heart disease and other diseases of the circulatory system: Secondary | ICD-10-CM | POA: Diagnosis not present

## 2023-02-19 DIAGNOSIS — E785 Hyperlipidemia, unspecified: Secondary | ICD-10-CM | POA: Diagnosis not present

## 2023-02-19 DIAGNOSIS — F411 Generalized anxiety disorder: Secondary | ICD-10-CM | POA: Diagnosis not present

## 2023-02-19 DIAGNOSIS — Z825 Family history of asthma and other chronic lower respiratory diseases: Secondary | ICD-10-CM | POA: Diagnosis not present

## 2023-02-19 DIAGNOSIS — I10 Essential (primary) hypertension: Secondary | ICD-10-CM | POA: Diagnosis not present

## 2023-02-19 DIAGNOSIS — Z6830 Body mass index (BMI) 30.0-30.9, adult: Secondary | ICD-10-CM | POA: Diagnosis not present

## 2023-02-19 DIAGNOSIS — Z853 Personal history of malignant neoplasm of breast: Secondary | ICD-10-CM | POA: Diagnosis not present

## 2023-02-19 DIAGNOSIS — Z823 Family history of stroke: Secondary | ICD-10-CM | POA: Diagnosis not present

## 2023-02-19 DIAGNOSIS — Z833 Family history of diabetes mellitus: Secondary | ICD-10-CM | POA: Diagnosis not present

## 2023-03-08 DIAGNOSIS — F411 Generalized anxiety disorder: Secondary | ICD-10-CM | POA: Diagnosis not present

## 2023-03-08 DIAGNOSIS — F39 Unspecified mood [affective] disorder: Secondary | ICD-10-CM | POA: Diagnosis not present

## 2023-04-24 ENCOUNTER — Encounter (INDEPENDENT_AMBULATORY_CARE_PROVIDER_SITE_OTHER): Payer: Self-pay

## 2023-04-24 DIAGNOSIS — F3481 Disruptive mood dysregulation disorder: Secondary | ICD-10-CM | POA: Diagnosis not present

## 2023-04-30 DIAGNOSIS — F3481 Disruptive mood dysregulation disorder: Secondary | ICD-10-CM | POA: Diagnosis not present

## 2023-05-03 DIAGNOSIS — F411 Generalized anxiety disorder: Secondary | ICD-10-CM | POA: Diagnosis not present

## 2023-05-03 DIAGNOSIS — F39 Unspecified mood [affective] disorder: Secondary | ICD-10-CM | POA: Diagnosis not present

## 2023-05-07 ENCOUNTER — Other Ambulatory Visit: Payer: 59

## 2023-05-08 ENCOUNTER — Telehealth: Payer: Self-pay | Admitting: Family Medicine

## 2023-05-08 NOTE — Telephone Encounter (Signed)
Patient just called and said she just tested positive for Covid. She really would like for someone to call her tomorrow concerning what she can do. Her number is [phone].

## 2023-05-08 NOTE — Telephone Encounter (Signed)
Called pt and schedule for virtual 05/09/2023

## 2023-05-09 ENCOUNTER — Telehealth: Payer: 59 | Admitting: Family Medicine

## 2023-05-09 ENCOUNTER — Other Ambulatory Visit: Payer: 59

## 2023-05-09 ENCOUNTER — Encounter: Payer: Self-pay | Admitting: Family Medicine

## 2023-05-09 VITALS — Ht 66.0 in | Wt 171.0 lb

## 2023-05-09 DIAGNOSIS — F4321 Adjustment disorder with depressed mood: Secondary | ICD-10-CM

## 2023-05-09 DIAGNOSIS — U071 COVID-19: Secondary | ICD-10-CM | POA: Diagnosis not present

## 2023-05-09 DIAGNOSIS — F39 Unspecified mood [affective] disorder: Secondary | ICD-10-CM | POA: Diagnosis not present

## 2023-05-09 MED ORDER — MOLNUPIRAVIR EUA 200MG CAPSULE
4.0000 | ORAL_CAPSULE | Freq: Two times a day (BID) | ORAL | 0 refills | Status: AC
Start: 2023-05-09 — End: 2023-05-14

## 2023-05-09 MED ORDER — NIRMATRELVIR/RITONAVIR (PAXLOVID)TABLET
3.0000 | ORAL_TABLET | Freq: Two times a day (BID) | ORAL | 0 refills | Status: DC
Start: 2023-05-09 — End: 2023-05-09

## 2023-05-09 NOTE — Progress Notes (Signed)
Virtual Visit via Video note  I connected with Danielle Sparks on 05/09/23 at 1220 by video and verified that I am speaking with the correct person using two identifiers. Danielle Sparks is currently located at home and  is currently by herself during visit. The provider, Dana Allan, MD is located in their office at time of visit.  I discussed the limitations, risks, security and privacy concerns of performing an evaluation and management service by video and the availability of in person appointments. I also discussed with the patient that there may be a patient responsible charge related to this service. The patient expressed understanding and agreed to proceed.  Subjective: PCP: Dana Allan, MD  Chief Complaint  Patient presents with   Covid Positive    Test + positive cold like symptoms started yesterday am    HPI Symptoms started yesterday at 3 pm.  Took home COVID test last night and positive. Endorses decrease in energy and increased fatigue, nasal congestion and mild dry cough.  Denies any fevers, chest pain, shortness of breath,  wheezing, sore throat, difficulty swallowing.  Appetite good and hydrating well.  Has had COVID vaccines and previous COVID infection in past.  Unsure if wanting antiviral treatment at this time.   Grief Recently lost good friend to cancer last month.  Has been struggling with loss.  Reports service this upcoming Saturday.  She has been following with her psychiatrist and reports Abilify was recently added to her current medications.   ROS: Per HPI  Current Outpatient Medications:    amLODipine (NORVASC) 5 MG tablet, Take 1 tablet (5 mg total) by mouth daily., Disp: 90 tablet, Rfl: 3   ARIPiprazole (ABILIFY) 5 MG tablet, Take 5 mg by mouth daily., Disp: , Rfl:    buPROPion (WELLBUTRIN) 75 MG tablet, Take 150 mg by mouth 2 (two) times daily. , Disp: , Rfl:    calcium-vitamin D (OSCAL WITH D) 500-200 MG-UNIT tablet, Take 1 tablet by mouth., Disp: ,  Rfl:    cholecalciferol (VITAMIN D) 400 units TABS tablet, Take 400 Units by mouth., Disp: , Rfl:    Cyanocobalamin (B-12 PO), Take 400 mcg by mouth daily at 12 noon., Disp: , Rfl:    ezetimibe (ZETIA) 10 MG tablet, Take 1 tablet (10 mg total) by mouth daily., Disp: 90 tablet, Rfl: 3   fluticasone (FLONASE) 50 MCG/ACT nasal spray, Place 2 sprays into both nostrils daily., Disp: 48 g, Rfl: 3   hydrocortisone 2.5 % cream, Apply topically 2 (two) times daily as needed., Disp: 28.35 g, Rfl: 11   lamoTRIgine (LAMICTAL) 100 MG tablet, Take 1 tablet (100 mg total) by mouth daily., Disp: , Rfl:    molnupiravir EUA (LAGEVRIO) 200 mg CAPS capsule, Take 4 capsules (800 mg total) by mouth 2 (two) times daily for 5 days., Disp: 40 capsule, Rfl: 0   mupirocin ointment (BACTROBAN) 2 %, Apply 1 application. topically 2 (two) times daily. Prn, Disp: 30 g, Rfl: 0   omeprazole (PRILOSEC OTC) 20 MG tablet, Take 20 mg by mouth daily., Disp: , Rfl:    sertraline (ZOLOFT) 100 MG tablet, Take 150 mg by mouth daily., Disp: , Rfl:    sodium chloride (OCEAN) 0.65 % SOLN nasal spray, Place 2 sprays into both nostrils as needed for congestion., Disp: 90 mL, Rfl: 3   telmisartan (MICARDIS) 40 MG tablet, Take 1 tablet (40 mg total) by mouth daily., Disp: , Rfl:    triamterene-hydrochlorothiazide (MAXZIDE-25) 37.5-25 MG tablet, Take  1 tablet by mouth daily. In am, Disp: 90 tablet, Rfl: 3  Observations/Objective: Physical Exam Pulmonary:     Effort: Pulmonary effort is normal.  Neurological:     Mental Status: She is alert and oriented to person, place, and time. Mental status is at baseline.  Psychiatric:        Mood and Affect: Mood normal. Affect is tearful.        Behavior: Behavior normal.        Thought Content: Thought content normal.        Judgment: Judgment normal.    Assessment and Plan: Positive self-administered antigen test for COVID-19 Assessment & Plan: Home COVID test positive. In no acute respiratory  distress Symptoms management Prescription for [facility] sent.  Patient unsure if wanting to take but will have pharmacy hold off on filling script until decided or if symptoms worsens   Orders: -     molnupiravir EUA; Take 4 capsules (800 mg total) by mouth 2 (two) times daily for 5 days.  Dispense: 40 capsule; Refill: 0  Mood disorder (HCC) Assessment & Plan: Chronic Now exacerbated by loss of friend Recent addition of Abilify to medications Continue to follow up with psychiatry as scheduled   Grief reaction    Follow Up Instructions: Return in about 5 days (around 05/14/2023).   I discussed the assessment and treatment plan with the patient. The patient was provided an opportunity to ask questions and all were answered. The patient agreed with the plan and demonstrated an understanding of the instructions.   The patient was advised to call back or seek an in-person evaluation if the symptoms worsen or if the condition fails to improve as anticipated.  The above assessment and management plan was discussed with the patient. The patient verbalized understanding of and has agreed to the management plan. Patient is aware to call the clinic if symptoms persist or worsen. Patient is aware when to return to the clinic for a follow-up visit. Patient educated on when it is appropriate to go to the emergency department.    Dana Allan, MD

## 2023-05-09 NOTE — Assessment & Plan Note (Signed)
Chronic Now exacerbated by loss of friend Recent addition of Abilify to medications Continue to follow up with psychiatry as scheduled

## 2023-05-09 NOTE — Assessment & Plan Note (Signed)
Home COVID test positive. In no acute respiratory distress Symptoms management Prescription for [facility] sent.  Patient unsure if wanting to take but will have pharmacy hold off on filling script until decided or if symptoms worsens

## 2023-05-09 NOTE — Patient Instructions (Addendum)
It was a pleasure meeting you today. Thank you for allowing me to take part in your health care.  Our goals for today as we discussed include:  Start Molnupiravir 4 capsules two times a day for 5 days.  Symptomatic management for fever, muscle aches and headaches  -Tylenol 325-500 mg every 6 hours as needed -Ibuprofen 200 mg every 8 hours as needed  Stay well hydrated Rest as needed with frequent repositioning and ambulation.  Increase activity as soon as tolerated to help with recovery.  Continue wearing masks, hand washing and self isolation until symptom free.  If you have worsening symptoms, especially difficulty breathing please call 911 or have someone take you to the emergency department.    If you have any questions or concerns, please do not hesitate to call the office at [phone].  I look forward to our next visit and until then take care and stay safe.  Regards,   Dana Allan, MD   [facility]

## 2023-05-13 ENCOUNTER — Other Ambulatory Visit (INDEPENDENT_AMBULATORY_CARE_PROVIDER_SITE_OTHER): Payer: 59

## 2023-05-13 ENCOUNTER — Other Ambulatory Visit: Payer: 59

## 2023-05-13 DIAGNOSIS — R7303 Prediabetes: Secondary | ICD-10-CM | POA: Diagnosis not present

## 2023-05-13 DIAGNOSIS — I10 Essential (primary) hypertension: Secondary | ICD-10-CM

## 2023-05-13 DIAGNOSIS — E785 Hyperlipidemia, unspecified: Secondary | ICD-10-CM

## 2023-05-13 DIAGNOSIS — E669 Obesity, unspecified: Secondary | ICD-10-CM

## 2023-05-13 DIAGNOSIS — E538 Deficiency of other specified B group vitamins: Secondary | ICD-10-CM

## 2023-05-14 ENCOUNTER — Encounter: Payer: 59 | Admitting: Family Medicine

## 2023-05-14 LAB — LIPID PANEL
Cholesterol: 265 mg/dL — ABNORMAL HIGH (ref 0–200)
HDL: 65.7 mg/dL (ref >39.00)
LDL Cholesterol: 181 mg/dL — ABNORMAL HIGH (ref 0–99)
NonHDL: 199.67
Total CHOL/HDL Ratio: 4
Triglycerides: 92 mg/dL (ref 0.0–149.0)
VLDL: 18.4 mg/dL (ref 0.0–40.0)

## 2023-05-14 LAB — CBC WITH DIFFERENTIAL/PLATELET
Basophils Absolute: 0.1 10*3/uL (ref 0.0–0.1)
Basophils Relative: 1.3 % (ref 0.0–3.0)
Eosinophils Absolute: 0.1 10*3/uL (ref 0.0–0.7)
Eosinophils Relative: 1.5 % (ref 0.0–5.0)
HCT: 39.4 % (ref 36.0–46.0)
Hemoglobin: 12.9 g/dL (ref 12.0–15.0)
Lymphocytes Relative: 26.5 % (ref 12.0–46.0)
Lymphs Abs: 1.6 10*3/uL (ref 0.7–4.0)
MCHC: 32.8 g/dL (ref 30.0–36.0)
MCV: 95.4 fl (ref 78.0–100.0)
Monocytes Absolute: 0.4 10*3/uL (ref 0.1–1.0)
Monocytes Relative: 6.6 % (ref 3.0–12.0)
Neutro Abs: 3.8 10*3/uL (ref 1.4–7.7)
Neutrophils Relative %: 64.1 % (ref 43.0–77.0)
Platelets: 301 10*3/uL (ref 150.0–400.0)
RBC: 4.13 Mil/uL (ref 3.87–5.11)
RDW: 13.3 % (ref 11.5–15.5)
WBC: 5.9 10*3/uL (ref 4.0–10.5)

## 2023-05-14 LAB — HEMOGLOBIN A1C: Hgb A1c MFr Bld: 6.1 % (ref 4.6–6.5)

## 2023-05-14 LAB — COMPREHENSIVE METABOLIC PANEL
ALT: 22 U/L (ref 0–35)
AST: 20 U/L (ref 0–37)
Albumin: 4.8 g/dL (ref 3.5–5.2)
Alkaline Phosphatase: 88 U/L (ref 39–117)
BUN: 10 mg/dL (ref 6–23)
CO2: 25 meq/L (ref 19–32)
Calcium: 9.7 mg/dL (ref 8.4–10.5)
Chloride: 91 meq/L — ABNORMAL LOW (ref 96–112)
Creatinine, Ser: 0.84 mg/dL (ref 0.40–1.20)
GFR: 73.54 mL/min (ref >60.00)
Glucose, Bld: 106 mg/dL — ABNORMAL HIGH (ref 70–99)
Potassium: 4.2 meq/L (ref 3.5–5.1)
Sodium: 129 mEq/L — ABNORMAL LOW (ref 135–145)
Total Bilirubin: 0.8 mg/dL (ref 0.2–1.2)
Total Protein: 7.2 g/dL (ref 6.0–8.3)

## 2023-05-14 LAB — TSH: TSH: 1.34 u[IU]/mL (ref 0.35–5.50)

## 2023-05-14 LAB — VITAMIN D 25 HYDROXY (VIT D DEFICIENCY, FRACTURES): VITD: 40.84 ng/mL (ref 30.00–100.00)

## 2023-05-14 LAB — VITAMIN B12: Vitamin B-12: >1501 pg/mL — ABNORMAL HIGH (ref 211–911)

## 2023-05-21 DIAGNOSIS — F3481 Disruptive mood dysregulation disorder: Secondary | ICD-10-CM | POA: Diagnosis not present

## 2023-05-21 DIAGNOSIS — F411 Generalized anxiety disorder: Secondary | ICD-10-CM | POA: Diagnosis not present

## 2023-05-21 DIAGNOSIS — F39 Unspecified mood [affective] disorder: Secondary | ICD-10-CM | POA: Diagnosis not present

## 2023-05-28 ENCOUNTER — Encounter: Payer: Self-pay | Admitting: Family Medicine

## 2023-06-04 ENCOUNTER — Encounter: Payer: 59 | Admitting: Family Medicine

## 2023-06-06 DIAGNOSIS — F3481 Disruptive mood dysregulation disorder: Secondary | ICD-10-CM | POA: Diagnosis not present

## 2023-06-07 DIAGNOSIS — F411 Generalized anxiety disorder: Secondary | ICD-10-CM | POA: Diagnosis not present

## 2023-06-07 DIAGNOSIS — F39 Unspecified mood [affective] disorder: Secondary | ICD-10-CM | POA: Diagnosis not present

## 2023-06-13 ENCOUNTER — Encounter: Payer: Self-pay | Admitting: Family Medicine

## 2023-06-13 ENCOUNTER — Ambulatory Visit (INDEPENDENT_AMBULATORY_CARE_PROVIDER_SITE_OTHER): Payer: 59 | Admitting: Family Medicine

## 2023-06-13 VITALS — BP 120/80 | HR 67 | Temp 97.9°F | Resp 16 | Ht 65.0 in | Wt 180.0 lb

## 2023-06-13 DIAGNOSIS — I10 Essential (primary) hypertension: Secondary | ICD-10-CM

## 2023-06-13 DIAGNOSIS — F4321 Adjustment disorder with depressed mood: Secondary | ICD-10-CM

## 2023-06-13 DIAGNOSIS — E559 Vitamin D deficiency, unspecified: Secondary | ICD-10-CM

## 2023-06-13 DIAGNOSIS — F39 Unspecified mood [affective] disorder: Secondary | ICD-10-CM

## 2023-06-13 DIAGNOSIS — Z0000 Encounter for general adult medical examination without abnormal findings: Secondary | ICD-10-CM | POA: Diagnosis not present

## 2023-06-13 DIAGNOSIS — E871 Hypo-osmolality and hyponatremia: Secondary | ICD-10-CM

## 2023-06-13 DIAGNOSIS — E785 Hyperlipidemia, unspecified: Secondary | ICD-10-CM

## 2023-06-13 LAB — COMPREHENSIVE METABOLIC PANEL
ALT: 15 U/L (ref 0–35)
AST: 13 U/L (ref 0–37)
Albumin: 4.4 g/dL (ref 3.5–5.2)
Alkaline Phosphatase: 67 U/L (ref 39–117)
BUN: 12 mg/dL (ref 6–23)
CO2: 31 mEq/L (ref 19–32)
Calcium: 9.5 mg/dL (ref 8.4–10.5)
Chloride: 99 mEq/L (ref 96–112)
Creatinine, Ser: 1.01 mg/dL (ref 0.40–1.20)
GFR: 58.92 mL/min — ABNORMAL LOW (ref >60.00)
Glucose, Bld: 82 mg/dL (ref 70–99)
Potassium: 4.7 mEq/L (ref 3.5–5.1)
Sodium: 138 mEq/L (ref 135–145)
Total Bilirubin: 0.5 mg/dL (ref 0.2–1.2)
Total Protein: 6.4 g/dL (ref 6.0–8.3)

## 2023-06-13 LAB — VITAMIN D 25 HYDROXY (VIT D DEFICIENCY, FRACTURES): VITD: 36.62 ng/mL (ref 30.00–100.00)

## 2023-06-13 MED ORDER — TELMISARTAN 80 MG PO TABS
80.0000 mg | ORAL_TABLET | Freq: Every day | ORAL | Status: AC
Start: 2023-06-13 — End: ?

## 2023-06-13 MED ORDER — AMLODIPINE BESYLATE 5 MG PO TABS: 5.0000 mg | ORAL_TABLET | Freq: Every day | ORAL | 3 refills | Status: DC

## 2023-06-13 MED ORDER — EZETIMIBE 10 MG PO TABS
10.0000 mg | ORAL_TABLET | Freq: Every day | ORAL | 3 refills | Status: DC
Start: 2023-06-13 — End: 2024-04-23

## 2023-06-13 NOTE — Progress Notes (Signed)
SUBJECTIVE:   Chief Complaint  Patient presents with   Annual Exam   HPI Presents for annual physical  Grief Recently lost close friend to cancer.  Was a new diagnosis and passed shortly thereafter.  Was unable to attend funeral due to COVID.  Continues to have difficulty with loss.  Has been following with her therapist and psychiatrist regularly.  Currently on medication and compliant.  Denies any SI/HI.  Also reports marital problems.  Currently seeing couples therapy but is frustrated with situation.    HTN Disease Monitoring Home BP Monitoring range 133/94 to 143/95   Chest pain- no      Dyspnea-  no  Reports previously had lower readings prior to death of friend and marital issues.  Has been having increased anxiety and with the changes in psychiatric medications BP has lowered.  Now normotensive.   Dizziness-no [employer] Confusion-no  Medication Monitoring Compliance: taking as prescribed.  Maxide 37.5-25 mg, Telmisartan 40 mg, Norvasc 5 mg. Lightheadedness-  no  Edema-  no   HLD - medications: Zetia 10 mg.  Statin Myalgia - compliance: yes - medication SEs: none  PERTINENT PMH / PSH: Mood disorder  OBJECTIVE:  BP 120/80   Pulse 67   Temp 97.9 F (36.6 C)   Resp 16   Ht 5\' 5"  (1.651 m)   Wt 180 lb (81.6 kg)   SpO2 99%   BMI 29.95 kg/m    Physical Exam Vitals reviewed.  Constitutional:      General: She is not in acute distress.    Appearance: She is not ill-appearing.  HENT:     Head: Normocephalic.     Right Ear: Tympanic membrane, ear canal and external ear normal.     Left Ear: Tympanic membrane, ear canal and external ear normal.     Nose: Nose normal.     Mouth/Throat:     Mouth: Mucous membranes are moist.  Eyes:     Extraocular Movements: Extraocular movements intact.     Conjunctiva/sclera: Conjunctivae normal.     Pupils: Pupils are equal, round, and reactive to light.  Neck:     Thyroid: No thyromegaly or thyroid tenderness.      Vascular: No carotid bruit.  Cardiovascular:     Rate and Rhythm: Normal rate and regular rhythm.     Pulses: Normal pulses.     Heart sounds: Normal heart sounds.  Pulmonary:     Effort: Pulmonary effort is normal.     Breath sounds: Normal breath sounds.  Abdominal:     General: Bowel sounds are normal. There is no distension.     Palpations: Abdomen is soft.     Tenderness: There is no abdominal tenderness. There is no right CVA tenderness, left CVA tenderness, guarding or rebound.  Musculoskeletal:        General: Normal range of motion.     Cervical back: Normal range of motion.     Right lower leg: No edema.     Left lower leg: No edema.  Lymphadenopathy:     Cervical: No cervical adenopathy.  Skin:    Capillary Refill: Capillary refill takes less than 2 seconds.  Neurological:     General: No focal deficit present.     Mental Status: She is alert and oriented to person, place, and time. Mental status is at baseline.     Motor: No weakness.  Psychiatric:        Mood and Affect: Mood normal. Affect is tearful.  Behavior: Behavior normal.        Thought Content: Thought content normal.        Judgment: Judgment normal.     ASSESSMENT/PLAN:  Annual physical exam Assessment & Plan: Mammogram up to date Recommend regular self breast exams Colonoscopy up to date.  Recommended repeat in 7 years.  No indication for cervical cancer screening, TAH Tetanus up to date Declined Shingles vaccine Hep C screening complete Discontinued HIV screening, patients preference   Hyperlipidemia, unspecified hyperlipidemia type -     Ezetimibe; Take 1 tablet (10 mg total) by mouth daily.  Dispense: 90 tablet; Refill: 3  Essential hypertension Assessment & Plan: Chronic.  Stable Discontinue Maxide 37.5-25 mg secondary to hyponatremia Continue amlodipine 5 mg daily Increase telmisartan 80 mg daily Follow up in 1 week for repeat BP and Bmet  Orders: -     amLODIPine Besylate;  Take 1 tablet (5 mg total) by mouth daily.  Dispense: 90 tablet; Refill: 3 -     Comprehensive metabolic panel; Future  Primary hypertension Assessment & Plan: Chronic.  Stable Discontinue Maxide 37.5-25 mg secondary to hyponatremia Continue amlodipine 5 mg daily Increase telmisartan 80 mg daily Follow up in 1 week for repeat BP and Bmet  Orders: -     Telmisartan; Take 1 tablet (80 mg total) by mouth daily.  Hyponatremia Assessment & Plan: Asymptomatic Na 129 Repeat Bmet today and in 1 week Likely secondary to diuretic use.   Discontinue Maxide    Orders: -     Comprehensive metabolic panel -     Comprehensive metabolic panel; Future  Vitamin D deficiency -     VITAMIN D 25 Hydroxy (Vit-D Deficiency, Fractures)  Mood disorder (HCC) Assessment & Plan: Chronic.  Denies SI/HI Now exacerbated by loss of friend and marital issues Abilify discontinued  Vraylar and Lorazepam was initiated  Zoloft was increased to 200 mg Continue to follow up with psychiatry and therapist Continue couples therapy   Grief reaction Assessment & Plan: Continues to grieve the loss of friend Following with psychiatry and therapist regularly Medications adjusted by psychiatrist.     PDMP reviewed  Return in about 1 week (around 06/20/2023) for RN clinic, blood pressure and check home BP monitor.  Dana Allan, MD

## 2023-06-13 NOTE — Patient Instructions (Addendum)
It was a pleasure meeting you today. Thank you for allowing me to take part in your health care.  Our goals for today as we discussed include:  Increase Telmisartan to 80 mg at night.  Take two of the 40 mg tablets you currently have Continue Amlodipine 5 mg at night Stop Maxide.  Your sodium level has lowered  Recheck blood work today  Limit water and fluid intake to total of 48 oz in 24 hours  Follow up in 1 week in nurse clinic for repeat blood pressure check Please schedule lab appointment same day as RN visit  Follow up with Psychiatry and Therapist as scheduled    This is a list of the screening recommended for you and due dates:  Health Maintenance  Topic Date Due   HIV Screening  Never done   Flu Shot  12/23/2023*   Mammogram  01/21/2025   Colon Cancer Screening  09/04/2029   DTaP/Tdap/Td vaccine (3 - Td or Tdap) 06/28/2032   Hepatitis C Screening  Completed   HPV Vaccine  Aged Out   COVID-19 Vaccine  Discontinued   Zoster (Shingles) Vaccine  Discontinued  *Topic was postponed. The date shown is not the original due date.      If you have any questions or concerns, please do not hesitate to call the office at [phone].  I look forward to our next visit and until then take care and stay safe.  Regards,   Dana Allan, MD   [facility]

## 2023-06-19 ENCOUNTER — Ambulatory Visit (INDEPENDENT_AMBULATORY_CARE_PROVIDER_SITE_OTHER): Payer: 59 | Admitting: *Deleted

## 2023-06-19 DIAGNOSIS — I10 Essential (primary) hypertension: Secondary | ICD-10-CM | POA: Diagnosis not present

## 2023-06-19 DIAGNOSIS — E871 Hypo-osmolality and hyponatremia: Secondary | ICD-10-CM

## 2023-06-19 NOTE — Progress Notes (Signed)
Patient here for nurse visit BP check per order from Dr. Dana Allan  Patient reports compliance with prescribed BP medications: yes  Last dose of BP medication:   BP Readings from Last 3 Encounters:  06/19/23 128/80  06/13/23 120/80  11/13/22 118/78   Pulse Readings from Last 3 Encounters:  06/13/23 67  11/13/22 66  11/08/22 69    Pt had labs drawn today after visit & informed to continue meds as prescribed  Patient verbalized understanding of instructions.   Thurmond Butts, CMA

## 2023-06-20 ENCOUNTER — Encounter: Payer: Self-pay | Admitting: Family Medicine

## 2023-06-20 LAB — COMPREHENSIVE METABOLIC PANEL
ALT: 21 U/L (ref 0–35)
AST: 15 U/L (ref 0–37)
Albumin: 4.4 g/dL (ref 3.5–5.2)
Alkaline Phosphatase: 69 U/L (ref 39–117)
BUN: 15 mg/dL (ref 6–23)
CO2: 27 mEq/L (ref 19–32)
Calcium: 9.3 mg/dL (ref 8.4–10.5)
Chloride: 104 mEq/L (ref 96–112)
Creatinine, Ser: 1.04 mg/dL (ref 0.40–1.20)
GFR: 56.87 mL/min — ABNORMAL LOW (ref >60.00)
Glucose, Bld: 90 mg/dL (ref 70–99)
Potassium: 4.1 mEq/L (ref 3.5–5.1)
Sodium: 139 mEq/L (ref 135–145)
Total Bilirubin: 0.5 mg/dL (ref 0.2–1.2)
Total Protein: 6.6 g/dL (ref 6.0–8.3)

## 2023-06-22 ENCOUNTER — Encounter: Payer: Self-pay | Admitting: Family Medicine

## 2023-06-22 DIAGNOSIS — Z0000 Encounter for general adult medical examination without abnormal findings: Secondary | ICD-10-CM | POA: Insufficient documentation

## 2023-06-22 DIAGNOSIS — E871 Hypo-osmolality and hyponatremia: Secondary | ICD-10-CM | POA: Insufficient documentation

## 2023-06-22 DIAGNOSIS — E559 Vitamin D deficiency, unspecified: Secondary | ICD-10-CM | POA: Insufficient documentation

## 2023-06-22 DIAGNOSIS — E785 Hyperlipidemia, unspecified: Secondary | ICD-10-CM | POA: Insufficient documentation

## 2023-06-22 NOTE — Assessment & Plan Note (Signed)
Asymptomatic Na 129 Repeat Bmet today and in 1 week Likely secondary to diuretic use.   Discontinue Maxide

## 2023-06-22 NOTE — Assessment & Plan Note (Signed)
Continues to grieve the loss of friend Following with psychiatry and therapist regularly Medications adjusted by psychiatrist.

## 2023-06-22 NOTE — Assessment & Plan Note (Addendum)
Mammogram up to date Recommend regular self breast exams Colonoscopy up to date.  Recommended repeat in 7 years.  No indication for cervical cancer screening, TAH Tetanus up to date Declined Shingles vaccine Hep C screening complete Discontinued HIV screening, patients preference

## 2023-06-22 NOTE — Assessment & Plan Note (Signed)
>>  ASSESSMENT AND PLAN FOR ANNUAL PHYSICAL EXAM WRITTEN ON 06/22/2023  8:00 PM BY WALSH, TANYA, MD  Mammogram up to date Recommend regular self breast exams Colonoscopy up to date.  Recommended repeat in 7 years.  No indication for cervical cancer screening, TAH Tetanus up to date Declined Shingles vaccine Hep C screening complete Discontinued HIV screening, patients preference

## 2023-06-22 NOTE — Assessment & Plan Note (Signed)
Chronic.  Stable Discontinue Maxide 37.5-25 mg secondary to hyponatremia Continue amlodipine 5 mg daily Increase telmisartan 80 mg daily Follow up in 1 week for repeat BP and Bmet

## 2023-06-22 NOTE — Assessment & Plan Note (Addendum)
Chronic.  Denies SI/HI Now exacerbated by loss of friend and marital issues Abilify discontinued  Vraylar and Lorazepam was initiated  Zoloft was increased to 200 mg Continue to follow up with psychiatry and therapist Continue couples therapy

## 2023-06-24 DIAGNOSIS — F3481 Disruptive mood dysregulation disorder: Secondary | ICD-10-CM | POA: Diagnosis not present

## 2023-06-25 ENCOUNTER — Telehealth: Payer: Self-pay

## 2023-06-25 ENCOUNTER — Other Ambulatory Visit: Payer: Self-pay

## 2023-06-25 DIAGNOSIS — I10 Essential (primary) hypertension: Secondary | ICD-10-CM

## 2023-06-25 MED ORDER — TELMISARTAN 80 MG PO TABS
80.0000 mg | ORAL_TABLET | Freq: Every day | ORAL | 3 refills | Status: DC
Start: 2023-06-25 — End: 2024-04-23

## 2023-06-25 NOTE — Telephone Encounter (Signed)
Pt wanted to know if she needs to make a follow up appointment with Dr. Clent Ridges. She stated on her AVS is just says follow up with a nurse visit which she has already done.

## 2023-06-26 NOTE — Telephone Encounter (Signed)
Pt wanted to know if you could change her bp medication. He said that he Bp is still high. She stated that he Bp last was in the triple digits.

## 2023-06-26 NOTE — Telephone Encounter (Signed)
Left message to return call to our office.  Pt need an office follow up in 3 months with Dr. Clent Ridges.

## 2023-06-26 NOTE — Telephone Encounter (Signed)
Pt would like to be called concerning her BP medication

## 2023-06-27 DIAGNOSIS — F411 Generalized anxiety disorder: Secondary | ICD-10-CM | POA: Diagnosis not present

## 2023-06-27 DIAGNOSIS — F39 Unspecified mood [affective] disorder: Secondary | ICD-10-CM | POA: Diagnosis not present

## 2023-06-27 NOTE — Telephone Encounter (Signed)
Called pt and scheduled an appointment on 07/04/2023 with Dr. Clent Ridges.

## 2023-07-04 ENCOUNTER — Ambulatory Visit: Payer: 59 | Admitting: Family Medicine

## 2023-07-04 ENCOUNTER — Encounter: Payer: Self-pay | Admitting: Family Medicine

## 2023-07-04 VITALS — BP 132/84 | Temp 97.7°F | Resp 16 | Ht 65.0 in | Wt 185.0 lb

## 2023-07-04 DIAGNOSIS — E785 Hyperlipidemia, unspecified: Secondary | ICD-10-CM

## 2023-07-04 DIAGNOSIS — I10 Essential (primary) hypertension: Secondary | ICD-10-CM | POA: Diagnosis not present

## 2023-07-04 DIAGNOSIS — M6281 Muscle weakness (generalized): Secondary | ICD-10-CM

## 2023-07-04 DIAGNOSIS — F39 Unspecified mood [affective] disorder: Secondary | ICD-10-CM

## 2023-07-04 DIAGNOSIS — E871 Hypo-osmolality and hyponatremia: Secondary | ICD-10-CM

## 2023-07-04 MED ORDER — SPIRONOLACTONE 25 MG PO TABS
12.5000 mg | ORAL_TABLET | Freq: Every day | ORAL | 0 refills | Status: DC
Start: 2023-07-04 — End: 2023-08-20

## 2023-07-04 MED ORDER — SPIRONOLACTONE 25 MG PO TABS
12.5000 mg | ORAL_TABLET | Freq: Every day | ORAL | 0 refills | Status: DC
Start: 2023-07-04 — End: 2023-07-04

## 2023-07-04 NOTE — Progress Notes (Signed)
SUBJECTIVE:   Chief Complaint  Patient presents with   Med Change Request   HPI Presents to clinic to discuss HTN medication  Discussed the use of AI scribe software for clinical note transcription with the patient, who gave verbal consent to proceed.  History of Present Illness The patient, with a history of hypertension, presents for a follow-up visit due to fluctuating blood pressure readings. They report that their blood pressure readings have been inconsistent, with some readings being significantly high. The patient has been monitoring their blood pressure at home and has noticed that the readings vary greatly, even within the same day. They also report experiencing headaches when their blood pressure is high.  The patient was previously on Maxzide, which was discontinued due to low sodium levels. Their sodium levels have since improved. They are currently on Telmisartan, which they are considering changing due to the inconsistent blood pressure readings. They also take Amlodipine in the evenings.  In addition to hypertension, the patient has been experiencing weight gain and weakness in their legs. They report a change in their diet, consuming more junk food recently. They also mention that they have been feeling anxious, despite not having any identifiable stressors. They are currently on Vraylar, prescribed by their psychiatrist, and occasionally take Lorazepam for anxiety.  The patient denies experiencing any chest pain, shortness of breath, or swelling in their legs. They are compliant with their medications and are open to adjustments in their treatment plan to better manage their blood pressure.     PERTINENT PMH / PSH: As above  OBJECTIVE:  BP 132/84   Temp 97.7 F (36.5 C)   Resp 16   Ht 5\' 5"  (1.651 m)   Wt 185 lb (83.9 kg)   BMI 30.79 kg/m    Physical Exam Vitals reviewed.  Constitutional:      General: She is not in acute distress.    Appearance: Normal  appearance. She is normal weight. She is not ill-appearing, toxic-appearing or diaphoretic.  Eyes:     General:        Right eye: No discharge.        Left eye: No discharge.     Conjunctiva/sclera: Conjunctivae normal.  Cardiovascular:     Rate and Rhythm: Normal rate and regular rhythm.     Heart sounds: Normal heart sounds.  Pulmonary:     Effort: Pulmonary effort is normal.     Breath sounds: Normal breath sounds. No wheezing.  Abdominal:     General: Bowel sounds are normal.  Musculoskeletal:        General: Normal range of motion.     Right lower leg: Edema (trace) present.     Left lower leg: Edema (trace) present.  Skin:    General: Skin is warm and dry.  Neurological:     General: No focal deficit present.     Mental Status: She is alert and oriented to person, place, and time. Mental status is at baseline.     Sensory: Sensation is intact.     Motor: No weakness, tremor or atrophy.     Coordination: Coordination is intact.     Gait: Gait is intact.  Psychiatric:        Mood and Affect: Mood normal.        Behavior: Behavior normal.        Thought Content: Thought content normal.        Judgment: Judgment normal.  11/13/2022    9:01 AM 06/28/2022    8:40 AM 06/08/2022    3:19 PM 12/27/2021    8:40 AM 10/25/2020    8:22 AM  Depression screen PHQ 2/9  Decreased Interest 0 0 0 0 0  Down, Depressed, Hopeless 0 0 0 0 0  PHQ - 2 Score 0 0 0 0 0  Altered sleeping     0  Tired, decreased energy     1  Change in appetite     1  Feeling bad or failure about yourself      0  Trouble concentrating     1  Moving slowly or fidgety/restless     0  Suicidal thoughts     0  PHQ-9 Score     3  Difficult doing work/chores     Not difficult at all      11/13/2022    9:01 AM 10/25/2020    8:22 AM  GAD 7 : Generalized Anxiety Score  Nervous, Anxious, on Edge 0 1  Control/stop worrying 0 0  Worry too much - different things 0 0  Trouble relaxing 0 1  Restless 0 0   Easily annoyed or irritable 0 0  Afraid - awful might happen 0 0  Total GAD 7 Score 0 2  Anxiety Difficulty Not difficult at all Not difficult at all    ASSESSMENT/PLAN:  Essential hypertension Assessment & Plan: Fluctuating blood pressure readings with some elevated readings despite current regimen of Telmisartan and Amlodipine. Patient reports headache with high readings. Maxzide was previously discontinued due to low sodium levels. -Start Spironolactone 12.5mg  daily. -Check blood pressure in 1 week. If still elevated, increase Spironolactone to 25mg  daily. -Check blood work 2 weeks after increasing Spironolactone to 25mg  daily. -Advise patient to monitor blood pressure less frequently to reduce anxiety-induced fluctuations.  Orders: -     Basic metabolic panel; Future -     Spironolactone; Take 0.5 tablets (12.5 mg total) by mouth daily.  Dispense: 30 tablet; Refill: 0  Hyperlipidemia with target low density lipoprotein (LDL) cholesterol less than 100 mg/dL Assessment & Plan: Patient reports taking Zetia. No recent cholesterol levels available. -Check lipid panel with next blood work.   Hyponatremia Assessment & Plan: Asymptomatic Resolved with discontinuation of Maxide. Repeat Bmet at next visit       Mood disorder [facility]) Assessment & Plan: Patient reports feeling anxious and has been prescribed Lorazepam and Vraylar by psychiatrist. -Continue current psychiatric medications as prescribed. -Weight increase possible side effect of Vraylar -Advise patient to continue therapy and follow-up with psychiatrist at the end of the month.   Muscle weakness (generalized) Assessment & Plan: Patient reports weakness in legs since starting Zetia. Exam benign today -Advise patient that Zetia is not typically associated with muscle weakness.  -Monitor symptoms. -Check for electrolytes abnormalities, CBC today        PDMP reviewed  Return in about 1 week (around  07/11/2023) for RN clinic, blood pressure and check with home monitor.  Dana Allan, MD

## 2023-07-04 NOTE — Patient Instructions (Addendum)
It was a pleasure meeting you today. Thank you for allowing me to take part in your health care.  Our goals for today as we discussed include:  Start Spironolactone 12.5 mg daily Continue Telmisartan 80 mg daily Continue Amlodipine 5 mg daily  Monitor blood pressure at home.  Goal <150/90 Schedule RN appointment in 1 week for blood pressure check.  Bring BP monitor   Schedule blood work in 2 weeks  If you have any questions or concerns, please do not hesitate to call the office at [phone].  I look forward to our next visit and until then take care and stay safe.  Regards,   Dana Allan, MD   [facility]

## 2023-07-07 ENCOUNTER — Encounter: Payer: Self-pay | Admitting: Family Medicine

## 2023-07-07 DIAGNOSIS — M6281 Muscle weakness (generalized): Secondary | ICD-10-CM | POA: Insufficient documentation

## 2023-07-07 NOTE — Assessment & Plan Note (Signed)
Patient reports weakness in legs since starting Zetia. Exam benign today -Advise patient that Zetia is not typically associated with muscle weakness.  -Monitor symptoms. -Check for electrolytes abnormalities, CBC today

## 2023-07-07 NOTE — Assessment & Plan Note (Signed)
>>  ASSESSMENT AND PLAN FOR HYPERLIPIDEMIA WITH TARGET LOW DENSITY LIPOPROTEIN (LDL) CHOLESTEROL LESS THAN 100 MG/DL WRITTEN ON 89/86/7975  3:10 PM BY WALSH, TANYA, MD  Patient reports taking Zetia . No recent cholesterol levels available. -Check lipid panel with next blood work.

## 2023-07-07 NOTE — Assessment & Plan Note (Signed)
Fluctuating blood pressure readings with some elevated readings despite current regimen of Telmisartan and Amlodipine. Patient reports headache with high readings. Maxzide was previously discontinued due to low sodium levels. -Start Spironolactone 12.5mg  daily. -Check blood pressure in 1 week. If still elevated, increase Spironolactone to 25mg  daily. -Check blood work 2 weeks after increasing Spironolactone to 25mg  daily. -Advise patient to monitor blood pressure less frequently to reduce anxiety-induced fluctuations.

## 2023-07-07 NOTE — Assessment & Plan Note (Addendum)
Patient reports feeling anxious and has been prescribed Lorazepam and Vraylar by psychiatrist. -Continue current psychiatric medications as prescribed. -Weight increase possible side effect of Vraylar -Advise patient to continue therapy and follow-up with psychiatrist at the end of the month.

## 2023-07-07 NOTE — Assessment & Plan Note (Signed)
Asymptomatic Resolved with discontinuation of Maxide. Repeat Bmet at next visit

## 2023-07-07 NOTE — Assessment & Plan Note (Signed)
Patient reports taking Zetia. No recent cholesterol levels available. -Check lipid panel with next blood work.

## 2023-07-12 ENCOUNTER — Ambulatory Visit: Payer: 59

## 2023-07-18 ENCOUNTER — Other Ambulatory Visit: Payer: 59

## 2023-07-22 DIAGNOSIS — F39 Unspecified mood [affective] disorder: Secondary | ICD-10-CM | POA: Diagnosis not present

## 2023-07-22 DIAGNOSIS — F411 Generalized anxiety disorder: Secondary | ICD-10-CM | POA: Diagnosis not present

## 2023-07-23 ENCOUNTER — Other Ambulatory Visit (INDEPENDENT_AMBULATORY_CARE_PROVIDER_SITE_OTHER): Payer: 59

## 2023-07-23 DIAGNOSIS — I10 Essential (primary) hypertension: Secondary | ICD-10-CM | POA: Diagnosis not present

## 2023-07-24 LAB — BASIC METABOLIC PANEL WITH GFR
BUN: 10 mg/dL (ref 6–23)
CO2: 28 meq/L (ref 19–32)
Calcium: 9.6 mg/dL (ref 8.4–10.5)
Chloride: 100 meq/L (ref 96–112)
Creatinine, Ser: 0.87 mg/dL (ref 0.40–1.20)
GFR: 70.41 mL/min
Glucose, Bld: 78 mg/dL (ref 70–99)
Potassium: 4.2 meq/L (ref 3.5–5.1)
Sodium: 138 meq/L (ref 135–145)

## 2023-07-28 ENCOUNTER — Encounter: Payer: Self-pay | Admitting: Family Medicine

## 2023-08-19 DIAGNOSIS — F411 Generalized anxiety disorder: Secondary | ICD-10-CM | POA: Diagnosis not present

## 2023-08-19 DIAGNOSIS — F39 Unspecified mood [affective] disorder: Secondary | ICD-10-CM | POA: Diagnosis not present

## 2023-08-20 ENCOUNTER — Other Ambulatory Visit: Payer: Self-pay

## 2023-08-20 DIAGNOSIS — I10 Essential (primary) hypertension: Secondary | ICD-10-CM

## 2023-08-21 MED ORDER — SPIRONOLACTONE 25 MG PO TABS: 12.5000 mg | ORAL_TABLET | Freq: Every day | ORAL | 0 refills | Status: DC

## 2023-08-26 ENCOUNTER — Telehealth (INDEPENDENT_AMBULATORY_CARE_PROVIDER_SITE_OTHER): Payer: 59 | Admitting: Family Medicine

## 2023-08-26 ENCOUNTER — Encounter: Payer: Self-pay | Admitting: Family Medicine

## 2023-08-26 VITALS — BP 148/90 | HR 63 | Temp 98.0°F | Resp 16 | Ht 65.0 in | Wt 184.4 lb

## 2023-08-26 DIAGNOSIS — I10 Essential (primary) hypertension: Secondary | ICD-10-CM | POA: Diagnosis not present

## 2023-08-26 DIAGNOSIS — E559 Vitamin D deficiency, unspecified: Secondary | ICD-10-CM | POA: Diagnosis not present

## 2023-08-26 DIAGNOSIS — M8580 Other specified disorders of bone density and structure, unspecified site: Secondary | ICD-10-CM | POA: Diagnosis not present

## 2023-08-26 DIAGNOSIS — F39 Unspecified mood [affective] disorder: Secondary | ICD-10-CM

## 2023-08-26 MED ORDER — SPIRONOLACTONE 25 MG PO TABS: 25.0000 mg | ORAL_TABLET | Freq: Every day | ORAL | 3 refills | Status: DC

## 2023-08-26 NOTE — Patient Instructions (Addendum)
It was a pleasure meeting you today. Thank you for allowing me to take part in your health care.  Our goals for today as we discussed include:  Continue Spironolactone 25 mg daily.  Refill sent Recommend taking Telmisartan at night Continue Amlodipine at night  Increase Vitamin D to 1000iu daily Stop Calcium supplements  This is a list of the screening recommended for you and due dates:  Health Maintenance  Topic Date Due   Mammogram  01/22/2024   Colon Cancer Screening  09/04/2029   DTaP/Tdap/Td vaccine (3 - Td or Tdap) 06/28/2032   Flu Shot  Completed   Hepatitis C Screening  Completed   HPV Vaccine  Aged Out   COVID-19 Vaccine  Discontinued   HIV Screening  Discontinued   Zoster (Shingles) Vaccine  Discontinued    Follow up in 3 months   If you have any questions or concerns, please do not hesitate to call the office at [phone].  I look forward to our next visit and until then take care and stay safe.  Regards,   Dana Allan, MD   [facility]

## 2023-08-26 NOTE — Progress Notes (Unsigned)
Virtual Visit via Video note  Office visit was originally for 1140.  Provider was tied up in emergency visit.  Patient agreeable to virtual visit at later time today.  I connected with Golden Pop on 08/28/23 at 1520 by video and verified that I am speaking with the correct person using two identifiers. Danielle Sparks is currently located at parked car and is currently alone during visit. The provider, Dana Allan, MD is located in their office at time of visit.  I discussed the limitations, risks, security and privacy concerns of performing an evaluation and management service by video and the availability of in person appointments. I also discussed with the patient that there may be a patient responsible charge related to this service. The patient expressed understanding and agreed to proceed.  Subjective: PCP: Dana Allan, MD  Chief Complaint  Patient presents with   Hypertension    HPI Discussed the use of AI scribe software for clinical note transcription with the patient, who gave verbal consent to proceed.  History of Present Illness The patient, born in [birth year], presents for a follow-up consultation regarding her hypertension. She reports that her blood pressure readings at home have been significantly better than the reading taken during her recent visit to the clinic, attributing the elevated reading to anxiety related to managing appointments. She also mentions a recent fall, suspecting a torn meniscus in her left leg, but has decided to delay an MRI and manage conservatively for the time being.  The patient's current antihypertensive regimen includes Telmisartan 80mg  and Amlodipine 5mg , both taken at night, and Spironolactone 12.5mg , taken during the day. She reports that she has recently increased the Spironolactone dosage to 25mg , which she believes has contributed to the improvement in her blood pressure readings. She also mentions that she has been taking the  Telmisartan in the morning, but plans to switch to taking it at night.  In addition to her hypertension, the patient is also being treated for a mood disorder. She reports that her mood has improved significantly since starting Vraylar 3mg , prescribed by her psychiatrist. She also mentions that she has been taking Lamictal, Ativan, Zoloft, and Wellbutrin, all of which have remained unchanged.  The patient also reports that she has stopped taking her calcium supplement to see if it was affecting her digestion of her medications. She continues to take a Vitamin D3 supplement, but is unsure of the dosage. She plans to purchase a new supplement with a dosage of 1000 international units.  The patient's kidney function has reportedly improved significantly since her last consultation, which she attributes to better control of her blood pressure. She reports that she has been trying to maintain a fluid intake of about two liters per day.      ROS: Per HPI  Current Outpatient Medications:    amLODipine (NORVASC) 5 MG tablet, Take 1 tablet (5 mg total) by mouth daily., Disp: 90 tablet, Rfl: 3   buPROPion (WELLBUTRIN) 75 MG tablet, Take 150 mg by mouth 2 (two) times daily. , Disp: , Rfl:    cariprazine (VRAYLAR) 3 MG capsule, Take 3 mg by mouth at bedtime., Disp: , Rfl:    cholecalciferol (VITAMIN D) 400 units TABS tablet, Take 1,000 Units by mouth., Disp: , Rfl:    Cyanocobalamin (B-12 PO), Take 400 mcg by mouth daily at 12 noon., Disp: , Rfl:    ezetimibe (ZETIA) 10 MG tablet, Take 1 tablet (10 mg total) by mouth daily., Disp: 90  tablet, Rfl: 3   fluticasone (FLONASE) 50 MCG/ACT nasal spray, Place 2 sprays into both nostrils daily., Disp: 48 g, Rfl: 3   hydrocortisone 2.5 % cream, Apply topically 2 (two) times daily as needed., Disp: 28.35 g, Rfl: 11   lamoTRIgine (LAMICTAL) 100 MG tablet, Take 1 tablet (100 mg total) by mouth daily., Disp: , Rfl:    LORazepam (ATIVAN) 0.5 MG tablet, Take 0.5 mg by  mouth daily., Disp: , Rfl:    mupirocin ointment (BACTROBAN) 2 %, Apply 1 application. topically 2 (two) times daily. Prn, Disp: 30 g, Rfl: 0   omeprazole (PRILOSEC OTC) 20 MG tablet, Take 20 mg by mouth daily., Disp: , Rfl:    sertraline (ZOLOFT) 100 MG tablet, Take 150 mg by mouth daily., Disp: , Rfl:    sodium chloride (OCEAN) 0.65 % SOLN nasal spray, Place 2 sprays into both nostrils as needed for congestion., Disp: 90 mL, Rfl: 3   telmisartan (MICARDIS) 80 MG tablet, Take 1 tablet (80 mg total) by mouth daily., Disp: 90 tablet, Rfl: 3   spironolactone (ALDACTONE) 25 MG tablet, Take 1 tablet (25 mg total) by mouth daily., Disp: 90 tablet, Rfl: 3  Observations/Objective: Physical Exam Pulmonary:     Effort: Pulmonary effort is normal.  Neurological:     Mental Status: She is alert and oriented to person, place, and time. Mental status is at baseline.  Psychiatric:        Mood and Affect: Mood normal.        Behavior: Behavior normal.        Thought Content: Thought content normal.        Judgment: Judgment normal.    Assessment and Plan: Mood disorder (HCC) Assessment & Plan: Patient reports improved mood and decreased crying episodes on current regimen of Vraylar 3mg , Lamictal, Ativan, Zoloft, and Wellbutrin BID. Plan discussed with Dr. Edwin Dada to potentially wean Vraylar in March. -Continue current psychiatric medications as prescribed. -Follow-up with Dr. Edwin Dada as needed.   Essential hypertension Assessment & Plan: Elevated blood pressure readings at clinic (148/90), but patient reports lower readings at home (140/79-80). Patient recently increased Spironolactone from 12.5mg  to 25mg  daily. -Continue current regimen:Amlodipine 5mg  at night, Telmisartan 80mg  at night, and Spironolactone 25mg  in the morning. -Check blood pressure at home and monitor for any significant changes. -Plan to reevaluate in 3 months.  Orders: -     Spironolactone; Take 1 tablet (25 mg total) by mouth  daily.  Dispense: 90 tablet; Refill: 3  Vitamin D deficiency Assessment & Plan: Patient reports taking over-the-counter Vitamin D3 supplement, but exact dosage is unclear and previous labs showed low levels. -Advise patient to take over-the-counter Vitamin D3 supplement of at least 1000 IU daily. -Plan to recheck Vitamin D levels at next visit in 3 months.   Osteopenia, unspecified location Assessment & Plan: Patient stopped taking calcium supplement and has not resumed. Previous labs showed normal calcium levels. -Advise patient that she does not need to resume calcium supplement as long as she is getting adequate dietary calcium. -Continue Vitamin D supplement    Follow Up Instructions: Return in about 3 months (around 11/24/2023) for blood pressure, PCP.   I discussed the assessment and treatment plan with the patient. The patient was provided an opportunity to ask questions and all were answered. The patient agreed with the plan and demonstrated an understanding of the instructions.   The patient was advised to call back or seek an in-person evaluation if the symptoms worsen  or if the condition fails to improve as anticipated.  The above assessment and management plan was discussed with the patient. The patient verbalized understanding of and has agreed to the management plan. Patient is aware to call the clinic if symptoms persist or worsen. Patient is aware when to return to the clinic for a follow-up visit. Patient educated on when it is appropriate to go to the emergency department.    Dana Allan, MD

## 2023-08-28 ENCOUNTER — Encounter: Payer: Self-pay | Admitting: Family Medicine

## 2023-08-28 NOTE — Assessment & Plan Note (Signed)
Patient reports improved mood and decreased crying episodes on current regimen of Vraylar 3mg , Lamictal, Ativan, Zoloft, and Wellbutrin BID. Plan discussed with Dr. Edwin Dada to potentially wean Vraylar in March. -Continue current psychiatric medications as prescribed. -Follow-up with Dr. Edwin Dada as needed.

## 2023-08-28 NOTE — Assessment & Plan Note (Signed)
Patient stopped taking calcium supplement and has not resumed. Previous labs showed normal calcium levels. -Advise patient that she does not need to resume calcium supplement as long as she is getting adequate dietary calcium. -Continue Vitamin D supplement

## 2023-08-28 NOTE — Assessment & Plan Note (Signed)
Patient reports taking over-the-counter Vitamin D3 supplement, but exact dosage is unclear and previous labs showed low levels. -Advise patient to take over-the-counter Vitamin D3 supplement of at least 1000 IU daily. -Plan to recheck Vitamin D levels at next visit in 3 months.

## 2023-08-28 NOTE — Assessment & Plan Note (Signed)
Elevated blood pressure readings at clinic (148/90), but patient reports lower readings at home (140/79-80). Patient recently increased Spironolactone from 12.5mg  to 25mg  daily. -Continue current regimen:Amlodipine 5mg  at night, Telmisartan 80mg  at night, and Spironolactone 25mg  in the morning. -Check blood pressure at home and monitor for any significant changes. -Plan to reevaluate in 3 months.

## 2023-08-29 DIAGNOSIS — M25562 Pain in left knee: Secondary | ICD-10-CM | POA: Diagnosis not present

## 2023-08-29 DIAGNOSIS — S8002XA Contusion of left knee, initial encounter: Secondary | ICD-10-CM | POA: Diagnosis not present

## 2023-10-21 DIAGNOSIS — F39 Unspecified mood [affective] disorder: Secondary | ICD-10-CM | POA: Diagnosis not present

## 2023-10-21 DIAGNOSIS — F411 Generalized anxiety disorder: Secondary | ICD-10-CM | POA: Diagnosis not present

## 2023-11-11 ENCOUNTER — Other Ambulatory Visit: Payer: Self-pay | Admitting: *Deleted

## 2023-11-11 DIAGNOSIS — C50811 Malignant neoplasm of overlapping sites of right female breast: Secondary | ICD-10-CM

## 2023-11-12 ENCOUNTER — Inpatient Hospital Stay: Payer: 59 | Attending: Internal Medicine

## 2023-11-12 ENCOUNTER — Encounter: Payer: Self-pay | Admitting: Internal Medicine

## 2023-11-12 ENCOUNTER — Inpatient Hospital Stay (HOSPITAL_BASED_OUTPATIENT_CLINIC_OR_DEPARTMENT_OTHER): Payer: 59 | Admitting: Internal Medicine

## 2023-11-12 VITALS — BP 110/70 | HR 87 | Temp 97.8°F | Resp 16 | Wt 187.0 lb

## 2023-11-12 DIAGNOSIS — M8580 Other specified disorders of bone density and structure, unspecified site: Secondary | ICD-10-CM | POA: Insufficient documentation

## 2023-11-12 DIAGNOSIS — C50811 Malignant neoplasm of overlapping sites of right female breast: Secondary | ICD-10-CM | POA: Diagnosis not present

## 2023-11-12 DIAGNOSIS — Z170 Estrogen receptor positive status [ER+]: Secondary | ICD-10-CM | POA: Insufficient documentation

## 2023-11-12 DIAGNOSIS — Z9221 Personal history of antineoplastic chemotherapy: Secondary | ICD-10-CM | POA: Diagnosis not present

## 2023-11-12 DIAGNOSIS — Z79899 Other long term (current) drug therapy: Secondary | ICD-10-CM | POA: Insufficient documentation

## 2023-11-12 DIAGNOSIS — Z1721 Progesterone receptor positive status: Secondary | ICD-10-CM | POA: Insufficient documentation

## 2023-11-12 LAB — CMP (CANCER CENTER ONLY)
ALT: 23 U/L (ref 0–44)
AST: 21 U/L (ref 15–41)
Albumin: 4.7 g/dL (ref 3.5–5.0)
Alkaline Phosphatase: 78 U/L (ref 38–126)
Anion gap: 12 (ref 5–15)
BUN: 13 mg/dL (ref 8–23)
CO2: 24 mmol/L (ref 22–32)
Calcium: 9.6 mg/dL (ref 8.9–10.3)
Chloride: 99 mmol/L (ref 98–111)
Creatinine: 0.86 mg/dL (ref 0.44–1.00)
GFR, Estimated: >60 mL/min (ref >60)
Glucose, Bld: 120 mg/dL — ABNORMAL HIGH (ref 70–99)
Potassium: 4.2 mmol/L (ref 3.5–5.1)
Sodium: 135 mmol/L (ref 135–145)
Total Bilirubin: 0.8 mg/dL (ref 0.0–1.2)
Total Protein: 7.6 g/dL (ref 6.5–8.1)

## 2023-11-12 LAB — CBC WITH DIFFERENTIAL (CANCER CENTER ONLY)
Abs Immature Granulocytes: 0.01 10*3/uL (ref 0.00–0.07)
Basophils Absolute: 0 10*3/uL (ref 0.0–0.1)
Basophils Relative: 1 %
Eosinophils Absolute: 0.1 10*3/uL (ref 0.0–0.5)
Eosinophils Relative: 2 %
HCT: 41.2 % (ref 36.0–46.0)
Hemoglobin: 14.2 g/dL (ref 12.0–15.0)
Immature Granulocytes: 0 %
Lymphocytes Relative: 29 %
Lymphs Abs: 1.4 10*3/uL (ref 0.7–4.0)
MCH: 31.7 pg (ref 26.0–34.0)
MCHC: 34.5 g/dL (ref 30.0–36.0)
MCV: 92 fL (ref 80.0–100.0)
Monocytes Absolute: 0.3 10*3/uL (ref 0.1–1.0)
Monocytes Relative: 6 %
Neutro Abs: 3 10*3/uL (ref 1.7–7.7)
Neutrophils Relative %: 62 %
Platelet Count: 241 10*3/uL (ref 150–400)
RBC: 4.48 MIL/uL (ref 3.87–5.11)
RDW: 13 % (ref 11.5–15.5)
WBC Count: 4.8 10*3/uL (ref 4.0–10.5)
nRBC: 0 % (ref 0.0–0.2)

## 2023-11-12 NOTE — Progress Notes (Signed)
[facility] OFFICE PROGRESS NOTE  Patient Care Team: Dana Allan, MD as PCP - General (Family Medicine) Earna Coder, MD as Consulting Physician (Internal Medicine)   SUMMARY OF HEMATOLOGIC/ONCOLOGIC HISTORY:  Oncology History Overview Note  # FEB 2014- BREAST CANCER STAGE II [T1N1; 1.3cm 1/12LN pos; extranodal extension; Dr.Smith ]ER/PR-POS; Her 2 NEU-NEG. ; dd AC- Taxol weeklyx12 S/p RT; NOV 2014- Start Femara; Stop Dec 2016; MAY 2017- START Aromasin; STOPPED in DEC 2016 [joint pains]  # Chronic depression [Dr.Kapur]  # my RISK- NEG for deleterious mutation; VUS- BRIP1 gene   Breast cancer, right breast (HCC) (Resolved)  01/12/2013 Initial Diagnosis   Breast cancer, right breast (HCC)   Carcinoma of overlapping sites of right breast in female, estrogen receptor positive [facility])   Radiation Therapy    The patient saw No care team member to display for radiation treatment. This is the current list of radiation treatment: Radiation Treatments     Patient's record has no active or historical radiation treatments documented.         INTERVAL HISTORY: Patient ambulating-independently.  Alone/.   A very pleasant 63 -year-old female patient with above history of stage II breast cancer 2014 currently on surveillance is here for follow-up.  Denies any Breast pain. Appetite is good. 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  Patient continues to follow-up closely with psychiatry for depression.     Chronic joint pains.  Not any worse.  Review of Systems  Constitutional:  Negative for chills, diaphoresis, fever, malaise/fatigue and weight loss.  HENT:  Negative for nosebleeds and sore throat.   Eyes:  Negative for double vision.  Respiratory:  Negative for cough, hemoptysis, sputum production, shortness of breath and wheezing.   Cardiovascular:  Negative for chest pain, palpitations, orthopnea and leg swelling.  Gastrointestinal:   Negative for abdominal pain, blood in stool, constipation, diarrhea, heartburn, melena, nausea and vomiting.  Genitourinary:  Negative for dysuria, frequency and urgency.  Musculoskeletal:  Positive for back pain and joint pain.  Skin: Negative.  Negative for itching and rash.  Neurological:  Negative for dizziness, tingling, focal weakness, weakness and headaches.  Endo/Heme/Allergies:  Does not bruise/bleed easily.  Psychiatric/Behavioral:  Positive for depression. The patient is nervous/anxious. The patient does not have insomnia.      PAST MEDICAL HISTORY :  Past Medical History:  Diagnosis Date   Anxiety    Arthritis    HAND   Breast cancer (HCC) 09/2012   chemo and radiation right   Breast cancer, female (HCC) 01/12/2013   Overview:   Last Assessment & Plan:   Completed treatment. On Femara. Will get recent notes from oncologist.   Breast cancer, right breast (HCC) 01/12/2013   Bunion    Cancer (HCC)    breast   Cough 02/10/2018   COVID-19    06/08/22   Decreased leukocytes 05/13/2012   Overview:   Last Assessment & Plan:   Mild leukopenia noted on recent labs. We'll plan to repeat CBC in 3 months.   Depression    Diaphoresis 06/30/2015   Difficulty reading due to visual problem 01/16/2017   Hyperlipidemia    Hypertension    Osteopenia    Other vitamin B12 deficiency anemia    Other vitamin B12 deficiency anemia    Personal history of chemotherapy    Personal history of radiation therapy    Postmenopausal atrophic vaginitis    Shingles    right flank  Tachycardia 06/28/2016    PAST SURGICAL HISTORY :   Past Surgical History:  Procedure Laterality Date   ABDOMINAL HYSTERECTOMY     BREAST BIOPSY Left 2002   stereotactic   breast clip     left breast   BREAST EXCISIONAL BIOPSY Right 2014   positive   BUNIONECTOMY     bilateral   COLONOSCOPY     GANGLION CYST EXCISION  01/09/2010   Removed    PORTA CATH INSERTION     X2   TUBAL LIGATION     WISDOM  TOOTH EXTRACTION      FAMILY HISTORY :   Family History  Problem Relation Age of Onset   Skin cancer Mother    Heart disease Mother        afib   Hyperlipidemia Mother    Colon polyps Father    Heart disease Father    Skin cancer Father    Prostate cancer Father    COPD Father    Hyperlipidemia Sister    Breast cancer Paternal Aunt 63   Breast cancer Cousin 33   Colon cancer Neg Hx    Rectal cancer Neg Hx    Stomach cancer Neg Hx    Crohn's disease Neg Hx    Esophageal cancer Neg Hx    Ulcerative colitis Neg Hx     SOCIAL HISTORY:   Social History   Tobacco Use   Smoking status: Never    Passive exposure: Never   Smokeless tobacco: Never  Vaping Use   Vaping status: Never Used  Substance Use Topics   Alcohol use: Yes    Comment: occasional glass of wine   Drug use: No    ALLERGIES:  is allergic to sulfa antibiotics and xanax [alprazolam].  MEDICATIONS:  Current Outpatient Medications  Medication Sig Dispense Refill   amLODipine (NORVASC) 5 MG tablet Take 1 tablet (5 mg total) by mouth daily. 90 tablet 3   buPROPion (WELLBUTRIN) 75 MG tablet Take 150 mg by mouth 2 (two) times daily.      cariprazine (VRAYLAR) 3 MG capsule Take 3 mg by mouth at bedtime.     cholecalciferol (VITAMIN D) 400 units TABS tablet Take 1,000 Units by mouth.     Cyanocobalamin (B-12 PO) Take 400 mcg by mouth daily at 12 noon.     ezetimibe (ZETIA) 10 MG tablet Take 1 tablet (10 mg total) by mouth daily. 90 tablet 3   fluticasone (FLONASE) 50 MCG/ACT nasal spray Place 2 sprays into both nostrils daily. 48 g 3   lamoTRIgine (LAMICTAL) 100 MG tablet Take 1 tablet (100 mg total) by mouth daily.     LORazepam (ATIVAN) 0.5 MG tablet Take 0.5 mg by mouth daily.     meloxicam (MOBIC) 15 MG tablet Take 15 mg by mouth as needed.     mupirocin ointment (BACTROBAN) 2 % Apply 1 application. topically 2 (two) times daily. Prn 30 g 0   omeprazole (PRILOSEC OTC) 20 MG tablet Take 20 mg by mouth daily.      sertraline (ZOLOFT) 100 MG tablet Take 150 mg by mouth daily.     sodium chloride (OCEAN) 0.65 % SOLN nasal spray Place 2 sprays into both nostrils as needed for congestion. 90 mL 3   spironolactone (ALDACTONE) 25 MG tablet Take 1 tablet (25 mg total) by mouth daily. 90 tablet 3   telmisartan (MICARDIS) 80 MG tablet Take 1 tablet (80 mg total) by mouth daily. 90 tablet 3  hydrocortisone 2.5 % cream Apply topically 2 (two) times daily as needed. 28.35 g 11   No current facility-administered medications for this visit.    PHYSICAL EXAMINATION: ECOG PERFORMANCE STATUS: 0 - Asymptomatic  BP 110/70 (BP Location: Left Arm, Patient Position: Sitting)   Pulse 87   Temp 97.8 F (36.6 C)   Resp 16   Wt 187 lb (84.8 kg)   SpO2 98%   BMI 31.12 kg/m   Filed Weights   11/12/23 1030  Weight: 187 lb (84.8 kg)    Physical Exam HENT:     Head: Normocephalic and atraumatic.     Mouth/Throat:     Pharynx: No oropharyngeal exudate.  Eyes:     Pupils: Pupils are equal, round, and reactive to light.  Cardiovascular:     Rate and Rhythm: Normal rate and regular rhythm.  Pulmonary:     Effort: No respiratory distress.     Breath sounds: No wheezing.  Abdominal:     General: Bowel sounds are normal. There is no distension.     Palpations: Abdomen is soft. There is no mass.     Tenderness: There is no abdominal tenderness. There is no guarding or rebound.  Musculoskeletal:        General: No tenderness. Normal range of motion.     Cervical back: Normal range of motion and neck supple.  Skin:    General: Skin is warm.     Comments:    Neurological:     Mental Status: She is alert and oriented to person, place, and time.  Psychiatric:        Mood and Affect: Affect normal.      LABORATORY DATA:  I have reviewed the data as listed    Component Value Date/Time   NA 135 11/12/2023 1019   NA 136 08/30/2014 1116   K 4.2 11/12/2023 1019   K 3.9 08/30/2014 1116   CL 99 11/12/2023  1019   CL 98 08/30/2014 1116   CO2 24 11/12/2023 1019   CO2 29 08/30/2014 1116   GLUCOSE 120 (H) 11/12/2023 1019   GLUCOSE 115 (H) 08/30/2014 1116   BUN 13 11/12/2023 1019   BUN 13 08/30/2014 1116   CREATININE 0.86 11/12/2023 1019   CREATININE 0.76 07/10/2018 0822   CALCIUM 9.6 11/12/2023 1019   CALCIUM 9.9 08/30/2014 1116   PROT 7.6 11/12/2023 1019   PROT 7.1 08/30/2014 1116   ALBUMIN 4.7 11/12/2023 1019   ALBUMIN 4.2 08/30/2014 1116   AST 21 11/12/2023 1019   ALT 23 11/12/2023 1019   ALT 31 08/30/2014 1116   ALKPHOS 78 11/12/2023 1019   ALKPHOS 81 08/30/2014 1116   BILITOT 0.8 11/12/2023 1019   GFRNONAA >60 11/12/2023 1019   GFRNONAA >60 08/30/2014 1116   GFRNONAA >60 11/11/2013 1443   GFRAA >60 08/25/2019 0824   GFRAA >60 08/30/2014 1116   GFRAA >60 11/11/2013 1443    No results found for: "SPEP", "UPEP"  Lab Results  Component Value Date   WBC 4.8 11/12/2023   NEUTROABS 3.0 11/12/2023   HGB 14.2 11/12/2023   HCT 41.2 11/12/2023   MCV 92.0 11/12/2023   PLT 241 11/12/2023      Chemistry      Component Value Date/Time   NA 135 11/12/2023 1019   NA 136 08/30/2014 1116   K 4.2 11/12/2023 1019   K 3.9 08/30/2014 1116   CL 99 11/12/2023 1019   CL 98 08/30/2014 1116   CO2 24  11/12/2023 1019   CO2 29 08/30/2014 1116   BUN 13 11/12/2023 1019   BUN 13 08/30/2014 1116   CREATININE 0.86 11/12/2023 1019   CREATININE 0.76 07/10/2018 0822      Component Value Date/Time   CALCIUM 9.6 11/12/2023 1019   CALCIUM 9.9 08/30/2014 1116   ALKPHOS 78 11/12/2023 1019   ALKPHOS 81 08/30/2014 1116   AST 21 11/12/2023 1019   ALT 23 11/12/2023 1019   ALT 31 08/30/2014 1116   BILITOT 0.8 11/12/2023 1019        ASSESSMENT & PLAN:   Carcinoma of overlapping sites of right breast in female, estrogen receptor positive (HCC) # Stage II breast cancer ER/PR positive HER-2/neu negative [Dx-2016]-currently off arimidex since dec 2016 [sec to SEs].   Stable. Clinically no  evidence of recurrence. Mammogram April 2024-- WNL;  screening mammogram 2025- pending. .  # chronic joint pains-  Continue vitamin D calcium and/chondroitin.  Vitamin D glucosamine chondroitin-stable  # Depression/bipolar-STABLE; zoloft/wellbutrin/Lamictal-better controlled  [Dr. Maryruth Bun.]. stable  # OSTEOPENIA- DEC 2016- BMD -2.3;  On ca+vit D;2022- T-score of -1.8- stable.   I reviewed the bone density.  awaiting bone density again in 2025.  # DISPOSITION: # mammogram Bil screening march 2025; and BMD-  # 12 months-MD; labs-cbc/cmp/ca-27-29; vit D 25-OH levels - Dr.B      Earna Coder, MD 11/12/2023 11:26 AM

## 2023-11-12 NOTE — Assessment & Plan Note (Addendum)
#   Stage II breast cancer ER/PR positive HER-2/neu negative [Dx-2016]-currently off arimidex since dec 2016 [sec to SEs].   Stable. Clinically no evidence of recurrence. Mammogram April 2024-- WNL;  screening mammogram 2025- pending. .  # chronic joint pains-  Continue vitamin D calcium and/chondroitin.  Vitamin D glucosamine chondroitin-stable  # Depression/bipolar-STABLE; zoloft/wellbutrin/Lamictal-better controlled  [Dr. Maryruth Bun.]. stable  # OSTEOPENIA- DEC 2016- BMD -2.3;  On ca+vit D;2022- T-score of -1.8- stable.   I reviewed the bone density.  awaiting bone density again in 2025.  # DISPOSITION: # mammogram Bil screening march 2025; and BMD-  # 12 months-MD; labs-cbc/cmp/ca-27-29; vit D 25-OH levels - Dr.B

## 2023-11-12 NOTE — Addendum Note (Signed)
Addended by: Darrold Span A on: 11/12/2023 11:35 AM   Modules accepted: Orders

## 2023-11-12 NOTE — Progress Notes (Signed)
Denies any Breast pain. Appetite is good. 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

## 2023-11-13 LAB — CANCER ANTIGEN 27.29: CA 27.29: 23.4 U/mL (ref 0.0–38.6)

## 2023-11-14 ENCOUNTER — Other Ambulatory Visit: Payer: Self-pay | Admitting: *Deleted

## 2023-11-14 ENCOUNTER — Encounter: Payer: Self-pay | Admitting: Internal Medicine

## 2023-11-14 DIAGNOSIS — C50811 Malignant neoplasm of overlapping sites of right female breast: Secondary | ICD-10-CM

## 2023-12-09 DIAGNOSIS — F39 Unspecified mood [affective] disorder: Secondary | ICD-10-CM | POA: Diagnosis not present

## 2023-12-09 DIAGNOSIS — F411 Generalized anxiety disorder: Secondary | ICD-10-CM | POA: Diagnosis not present

## 2023-12-12 ENCOUNTER — Inpatient Hospital Stay: Payer: 59 | Attending: Internal Medicine

## 2023-12-12 DIAGNOSIS — M8580 Other specified disorders of bone density and structure, unspecified site: Secondary | ICD-10-CM | POA: Insufficient documentation

## 2023-12-12 DIAGNOSIS — Z170 Estrogen receptor positive status [ER+]: Secondary | ICD-10-CM

## 2023-12-12 DIAGNOSIS — Z853 Personal history of malignant neoplasm of breast: Secondary | ICD-10-CM | POA: Insufficient documentation

## 2023-12-13 ENCOUNTER — Encounter: Payer: Self-pay | Admitting: Internal Medicine

## 2023-12-13 LAB — CANCER ANTIGEN 27.29: CA 27.29: 18.4 U/mL (ref 0.0–38.6)

## 2024-02-13 ENCOUNTER — Telehealth: Payer: Self-pay

## 2024-02-13 NOTE — Telephone Encounter (Signed)
 Copied from CRM [id number]. Topic: Clinical - Request for Lab/Test Order >> Feb 13, 2024  1:56 PM Elle L wrote: Reason for CRM: The patient is requesting lab orders before her next appointment in November.

## 2024-02-21 NOTE — Telephone Encounter (Signed)
 I recommend we get labs after the appointment just for this visit. I need to see the patient to decide what labs needs to be done and I am not going to order labs before I have seen the patient.   Thank you,  Jacklin Mascot, MD

## 2024-02-21 NOTE — Telephone Encounter (Signed)
 Spoke with patient to make her aware of Dr Melanee Spire recommendations. Patient verbalized understanding. Patient would like to know if there is any availability sooner than November because she is due for her Physical in August. Please advise?

## 2024-04-23 ENCOUNTER — Ambulatory Visit (INDEPENDENT_AMBULATORY_CARE_PROVIDER_SITE_OTHER)

## 2024-04-23 VITALS — BP 120/80 | HR 78 | Temp 98.3°F | Ht 65.0 in | Wt 146.4 lb

## 2024-04-23 DIAGNOSIS — F39 Unspecified mood [affective] disorder: Secondary | ICD-10-CM

## 2024-04-23 DIAGNOSIS — E538 Deficiency of other specified B group vitamins: Secondary | ICD-10-CM

## 2024-04-23 DIAGNOSIS — E782 Mixed hyperlipidemia: Secondary | ICD-10-CM

## 2024-04-23 DIAGNOSIS — E669 Obesity, unspecified: Secondary | ICD-10-CM

## 2024-04-23 DIAGNOSIS — R7303 Prediabetes: Secondary | ICD-10-CM | POA: Diagnosis not present

## 2024-04-23 DIAGNOSIS — E785 Hyperlipidemia, unspecified: Secondary | ICD-10-CM

## 2024-04-23 DIAGNOSIS — L304 Erythema intertrigo: Secondary | ICD-10-CM

## 2024-04-23 DIAGNOSIS — I10 Essential (primary) hypertension: Secondary | ICD-10-CM

## 2024-04-23 DIAGNOSIS — L309 Dermatitis, unspecified: Secondary | ICD-10-CM | POA: Insufficient documentation

## 2024-04-23 DIAGNOSIS — E871 Hypo-osmolality and hyponatremia: Secondary | ICD-10-CM

## 2024-04-23 MED ORDER — AMLODIPINE BESYLATE 5 MG PO TABS: 5.0000 mg | ORAL_TABLET | Freq: Every day | ORAL | 0 refills | Status: DC

## 2024-04-23 MED ORDER — SPIRONOLACTONE 25 MG PO TABS
25.0000 mg | ORAL_TABLET | Freq: Every day | ORAL | 0 refills | Status: DC
Start: 2024-04-23 — End: 2024-06-10

## 2024-04-23 MED ORDER — HYDROCORTISONE 2.5 % EX CREA: TOPICAL_CREAM | Freq: Two times a day (BID) | CUTANEOUS | 3 refills | Status: AC | PRN

## 2024-04-23 MED ORDER — TELMISARTAN 80 MG PO TABS: 80.0000 mg | ORAL_TABLET | Freq: Every day | ORAL | 0 refills | Status: DC

## 2024-04-23 NOTE — Assessment & Plan Note (Signed)
 Hydrocortisone  2.5% twice daily as needed with symptoms resolution, refill sent.

## 2024-04-23 NOTE — Assessment & Plan Note (Signed)
 Follow-up with Dr. Chipper as scheduled, management, medication refill per her recommendations.

## 2024-04-23 NOTE — Progress Notes (Signed)
 Established Patient Office Visit TOC from Dr. Hope    Subjective  Patient ID: Danielle Sparks, female    DOB: May 23, 1961  Age: 63 y.o. MRN: [id number]  Chief Complaint  Patient presents with   Establish Care    She  has a past medical history of Anxiety, Arthritis, Breast cancer (HCC) (09/2012), Breast cancer, female (HCC) (01/12/2013), Breast cancer, right breast (HCC) (01/12/2013), Bunion, Cancer (HCC), Cough (02/10/2018), COVID-19, Decreased leukocytes (05/13/2012), Depression, Diaphoresis (06/30/2015), Difficulty reading due to visual problem (01/16/2017), Hyperlipidemia, Hypertension, Osteopenia, Other vitamin B12 deficiency anemia, Other vitamin B12 deficiency anemia, Overweight (BMI 25.0-29.9) (07/13/2015), Personal history of chemotherapy, Personal history of radiation therapy, Postmenopausal atrophic vaginitis, Shingles, Short-term memory loss (11/23/2020), and Tachycardia (06/28/2016).  HPI Discussed the use of AI scribe software for clinical note transcription with the patient, who gave verbal consent to proceed.  History of Present Illness Danielle Sparks is a 63 year old female who presents to establish care.   - She has a history of hypertension, currently managed with amlodipine  5 mg in the evening, spironolactone  25 mg in the morning, and telmisartan  80 in the evening. She reports a history of difficulty controlling diastolic BP in the past.   - She has a history of breast cancer and has been under psychiatric care since her diagnosis. She takes Wellbutrin 150 mg twice a day, Zoloft 150 mg daily and Lorazepam as needed for anxiety. There has been significant improvement in her mental health.  - She is on Semaglutide  (believes 0.5 mg once weekly injectable. She has lost a significant amount of weight since starting this. She eats heathy and walks about 3 miles a day for exercise.  - She has a history of allergic rhinitis and uses Flonase , nasal saline rinses as needed,  particularly during pollen season.  - She has a history of prediabetes and is hopeful that dietary changes and weight loss will improve her condition. She does not currently take any cholesterol-lowering medication, despite a history of elevated LDL cholesterol, significant family h/o stroke, cardiac disease in her parents.   - She experiences a rash under her left breast during the summer months, managed with hydrocortisone  cream as needed.    ROS As per HPI    Objective:     BP 120/80 (BP Location: Left Arm, Cuff Size: Normal)   Pulse 78   Temp 98.3 F (36.8 C) (Oral)   Ht 5' 5 (1.651 m)   Wt 146 lb 6.4 oz (66.4 kg)   SpO2 97%   BMI 24.36 kg/m      04/23/2024   11:42 AM 11/13/2022    9:01 AM 06/28/2022    8:40 AM  Depression screen PHQ 2/9  Decreased Interest 0 0 0  Down, Depressed, Hopeless 1 0 0  PHQ - 2 Score 1 0 0  Altered sleeping 0    Tired, decreased energy 1    Change in appetite 0    Feeling bad or failure about yourself  0    Trouble concentrating 0    Moving slowly or fidgety/restless 0    Suicidal thoughts 0    PHQ-9 Score 2    Difficult doing work/chores Not difficult at all        04/23/2024   11:43 AM 11/13/2022    9:01 AM 10/25/2020    8:22 AM  GAD 7 : Generalized Anxiety Score  Nervous, Anxious, on Edge 0 0 1  Control/stop worrying 0 0 0  Worry too  much - different things 0 0 0  Trouble relaxing 1 0 1  Restless 0 0 0  Easily annoyed or irritable 1 0 0  Afraid - awful might happen 0 0 0  Total GAD 7 Score 2 0 2  Anxiety Difficulty Not difficult at all Not difficult at all Not difficult at all      04/23/2024   11:42 AM 11/13/2022    9:01 AM 06/28/2022    8:40 AM  Depression screen PHQ 2/9  Decreased Interest 0 0 0  Down, Depressed, Hopeless 1 0 0  PHQ - 2 Score 1 0 0  Altered sleeping 0    Tired, decreased energy 1    Change in appetite 0    Feeling bad or failure about yourself  0    Trouble concentrating 0    Moving slowly or  fidgety/restless 0    Suicidal thoughts 0    PHQ-9 Score 2    Difficult doing work/chores Not difficult at all        04/23/2024   11:43 AM 11/13/2022    9:01 AM 10/25/2020    8:22 AM  GAD 7 : Generalized Anxiety Score  Nervous, Anxious, on Edge 0 0 1  Control/stop worrying 0 0 0  Worry too much - different things 0 0 0  Trouble relaxing 1 0 1  Restless 0 0 0  Easily annoyed or irritable 1 0 0  Afraid - awful might happen 0 0 0  Total GAD 7 Score 2 0 2  Anxiety Difficulty Not difficult at all Not difficult at all Not difficult at all   SDOH Screenings   Depression (PHQ2-9): Low Risk  (04/23/2024)  Tobacco Use: Low Risk  (04/23/2024)     Physical Exam Constitutional:      General: She is not in acute distress.    Appearance: Normal appearance.  HENT:     Head: Normocephalic and atraumatic.     Right Ear: Tympanic membrane normal.     Left Ear: Tympanic membrane normal.     Mouth/Throat:     Mouth: Mucous membranes are moist.     Pharynx: Oropharynx is clear.  Neck:     Thyroid : No thyroid  mass or thyroid  tenderness.  Cardiovascular:     Rate and Rhythm: Normal rate and regular rhythm.  Pulmonary:     Effort: Pulmonary effort is normal.     Breath sounds: Normal breath sounds. No wheezing.  Abdominal:     General: Bowel sounds are normal.     Palpations: Abdomen is soft.     Tenderness: There is no abdominal tenderness.  Musculoskeletal:     Cervical back: Neck supple. No rigidity.     Right lower leg: No edema.     Left lower leg: No edema.  Skin:    General: Skin is warm.  Neurological:     Mental Status: She is alert and oriented to person, place, and time.  Psychiatric:        Mood and Affect: Mood normal.        Behavior: Behavior normal.        No results found for any visits on 04/23/24.  The 10-year ASCVD risk score (Arnett DK, et al., 2019) is: 7.1%     Assessment & Plan:   Essential hypertension Assessment & Plan: BP within goal with  current treatment, continue following:  - Spironolactone  25mg  daily, Amlodipine  5 mg daily, Telmisartan  80 mg daily.  - Check CMP during  next visit.  - Patient is anticipating change in pharmacy, she will update us  once she has information on which mail order pharmacy she wants us  to sed her prescription in the future.    Orders: -     amLODIPine  Besylate; Take 1 tablet (5 mg total) by mouth daily.  Dispense: 90 tablet; Refill: 0 -     Spironolactone ; Take 1 tablet (25 mg total) by mouth daily.  Dispense: 90 tablet; Refill: 0 -     Telmisartan ; Take 1 tablet (80 mg total) by mouth daily.  Dispense: 90 tablet; Refill: 0 -     Comprehensive metabolic panel with GFR; Future  Prediabetes Assessment & Plan: Check A1c  Orders: -     Hemoglobin A1c; Future  Intertrigo Assessment & Plan: Hydrocortisone  2.5% twice daily as needed with symptoms resolution, refill sent.  Orders: -     Hydrocortisone ; Apply topically 2 (two) times daily as needed.  Dispense: 28.35 g; Refill: 3  B12 deficiency Assessment & Plan: History of B 12 deficiency in the past. Recommend repeat B 12 level.   Orders: -     Vitamin B12; Future  Mixed hyperlipidemia Assessment & Plan: With LDL above goal. She is not taking Zetia  which was prescribed to her previously due to personal choice. Patient reports she has looked into research on cholesterol lowering medications and is not keen on phramcological intervention. She is aware of increased risk of MI, stroke due to elevated LDL, family history.  We will obtain fasting lipid panel and continue to follow up.   Orders: -     Lipid panel; Future  Mood disorder [facility]) Assessment & Plan: Follow-up with Dr. Chipper as scheduled, management, medication refill per her recommendations.     I spent 40 minutes on the day of this face-to-face encounter reviewing the patient's medical and surgical history, medications, previous PCP notes/labs, ongoing concerns, and reviewing  the assessment and plan with the patient. This time also included counseling the patient on their health conditions and management options.   Return in about 3 months (around 07/24/2024) for Annual physical, fasting labs couple of days before that .   Luke Shade, MD

## 2024-04-23 NOTE — Assessment & Plan Note (Signed)
 BP within goal with current treatment, continue following:  - Spironolactone  25mg  daily, Amlodipine  5 mg daily, Telmisartan  80 mg daily.  - Check CMP during next visit.  - Patient is anticipating change in pharmacy, she will update us  once she has information on which mail order pharmacy she wants us  to sed her prescription in the future.

## 2024-04-23 NOTE — Assessment & Plan Note (Signed)
 History of B 12 deficiency in the past. Recommend repeat B 12 level.

## 2024-04-23 NOTE — Assessment & Plan Note (Signed)
 Check A1c.

## 2024-04-23 NOTE — Assessment & Plan Note (Signed)
 With LDL above goal. She is not taking Zetia  which was prescribed to her previously due to personal choice. Patient reports she has looked into research on cholesterol lowering medications and is not keen on phramcological intervention. She is aware of increased risk of MI, stroke due to elevated LDL, family history.  We will obtain fasting lipid panel and continue to follow up.

## 2024-05-05 ENCOUNTER — Other Ambulatory Visit

## 2024-05-05 ENCOUNTER — Ambulatory Visit

## 2024-06-03 ENCOUNTER — Other Ambulatory Visit: Payer: Self-pay

## 2024-06-03 DIAGNOSIS — I10 Essential (primary) hypertension: Secondary | ICD-10-CM

## 2024-06-03 NOTE — Telephone Encounter (Signed)
 FYI Only or Action Required?: Action required by provider: medication refill request.  Patient was last seen in primary care on 04/23/2024 by Abbey Bruckner, MD.  Called Nurse Triage reporting No chief complaint on file..  Symptoms began today.  Interventions attempted: Nothing.  Symptoms are: stable.  Triage Disposition: No disposition on file.  Patient/caregiver understands and will follow disposition?:

## 2024-06-03 NOTE — Telephone Encounter (Unsigned)
 Copied from CRM #[id number]. Topic: Clinical - Medication Refill >> Jun 03, 2024 10:49 AM Robinson H wrote: Medication: telmisartan  (MICARDIS ) 80 MG tablet, amLODipine  (NORVASC ) 5 MG tablet   Has the patient contacted their pharmacy? No, first time using pharmacy (Agent: If no, request that the patient contact the pharmacy for the refill. If patient does not wish to contact the pharmacy document the reason why and proceed with request.) (Agent: If yes, when and what did the pharmacy advise?)  This is the patient's preferred pharmacy:  [facility] - [place], [place] - [address] [place] [address] Phone: [phone] Fax: [phone]   Is this the correct pharmacy for this prescription? Yes If no, delete pharmacy and type the correct one.   Has the prescription been filled recently? No  Is the patient out of the medication? No  Has the patient been seen for an appointment in the last year OR does the patient have an upcoming appointment? Yes  Can we respond through MyChart? Yes  Agent: Please be advised that Rx refills may take up to 3 business days. We ask that you follow-up with your pharmacy.

## 2024-06-04 ENCOUNTER — Other Ambulatory Visit

## 2024-06-04 ENCOUNTER — Inpatient Hospital Stay: Admission: RE | Admit: 2024-06-04 | Source: Ambulatory Visit

## 2024-06-04 MED ORDER — AMLODIPINE BESYLATE 5 MG PO TABS: 5.0000 mg | ORAL_TABLET | Freq: Every day | ORAL | 0 refills | Status: DC

## 2024-06-04 MED ORDER — TELMISARTAN 80 MG PO TABS: 80.0000 mg | ORAL_TABLET | Freq: Every day | ORAL | 0 refills | Status: DC

## 2024-06-10 ENCOUNTER — Other Ambulatory Visit: Payer: Self-pay

## 2024-06-10 DIAGNOSIS — I10 Essential (primary) hypertension: Secondary | ICD-10-CM

## 2024-06-10 NOTE — Telephone Encounter (Unsigned)
 Copied from CRM [id number]. Topic: Clinical - Medication Refill >> Jun 10, 2024  1:09 PM Turkey A wrote: Medication: spironolactone  (ALDACTONE ) 25 MG tablet  Has the patient contacted their pharmacy? Yes (Agent: If no, request that the patient contact the pharmacy for the refill. If patient does not wish to contact the pharmacy document the reason why and proceed with request.) (Agent: If yes, when and what did the pharmacy advise?)  This is the patient's preferred pharmacy:  [facility] - [place], [place] - [address] [place] [address] Phone: [phone] Fax: [phone]   Is this the correct pharmacy for this prescription? Yes If no, delete pharmacy and type the correct one.   Has the prescription been filled recently? No  Is the patient out of the medication? Yes  Has the patient been seen for an appointment in the last year OR does the patient have an upcoming appointment? Yes  Can we respond through MyChart? Yes  Agent: Please be advised that Rx refills may take up to 3 business days. We ask that you follow-up with your pharmacy.

## 2024-06-11 MED ORDER — SPIRONOLACTONE 25 MG PO TABS: 25.0000 mg | ORAL_TABLET | Freq: Every day | ORAL | 0 refills | Status: DC

## 2024-06-23 ENCOUNTER — Telehealth: Payer: Self-pay

## 2024-06-23 NOTE — Telephone Encounter (Signed)
 Spoke with pt on 9/29 to let her know Dr Abbey would like her scheduled in for her Welcome to Medicare visit before 01/22/2025. Pt stated she was on the road and would call us  back on 9/30.  E2C2, please schedule a WTM appt with this pt if she calls back -kh

## 2024-06-30 DIAGNOSIS — H5203 Hypermetropia, bilateral: Secondary | ICD-10-CM | POA: Diagnosis not present

## 2024-07-09 ENCOUNTER — Ambulatory Visit
Admission: RE | Admit: 2024-07-09 | Discharge: 2024-07-09 | Disposition: A | Discharge status: Home / Self Care | Source: Ambulatory Visit | Attending: Internal Medicine | Admitting: Internal Medicine

## 2024-07-09 DIAGNOSIS — Z170 Estrogen receptor positive status [ER+]: Secondary | ICD-10-CM | POA: Diagnosis not present

## 2024-07-09 DIAGNOSIS — C50811 Malignant neoplasm of overlapping sites of right female breast: Secondary | ICD-10-CM | POA: Diagnosis not present

## 2024-07-09 DIAGNOSIS — Z1231 Encounter for screening mammogram for malignant neoplasm of breast: Secondary | ICD-10-CM | POA: Insufficient documentation

## 2024-07-09 DIAGNOSIS — Z780 Asymptomatic menopausal state: Secondary | ICD-10-CM | POA: Diagnosis not present

## 2024-07-09 DIAGNOSIS — M8589 Other specified disorders of bone density and structure, multiple sites: Secondary | ICD-10-CM | POA: Diagnosis not present

## 2024-07-24 ENCOUNTER — Other Ambulatory Visit (INDEPENDENT_AMBULATORY_CARE_PROVIDER_SITE_OTHER)

## 2024-07-24 ENCOUNTER — Ambulatory Visit: Payer: Self-pay

## 2024-07-24 DIAGNOSIS — E782 Mixed hyperlipidemia: Secondary | ICD-10-CM | POA: Diagnosis not present

## 2024-07-24 DIAGNOSIS — I10 Essential (primary) hypertension: Secondary | ICD-10-CM

## 2024-07-24 DIAGNOSIS — E538 Deficiency of other specified B group vitamins: Secondary | ICD-10-CM | POA: Diagnosis not present

## 2024-07-24 DIAGNOSIS — R7303 Prediabetes: Secondary | ICD-10-CM

## 2024-07-24 LAB — COMPREHENSIVE METABOLIC PANEL WITH GFR
ALT: 11 U/L (ref 0–35)
AST: 15 U/L (ref 0–37)
Albumin: 4.5 g/dL (ref 3.5–5.2)
Alkaline Phosphatase: 58 U/L (ref 39–117)
BUN: 13 mg/dL (ref 6–23)
CO2: 30 meq/L (ref 19–32)
Calcium: 9.4 mg/dL (ref 8.4–10.5)
Chloride: 99 meq/L (ref 96–112)
Creatinine, Ser: 0.88 mg/dL (ref 0.40–1.20)
GFR: 68.97 mL/min (ref >60.00)
Glucose, Bld: 76 mg/dL (ref 70–99)
Potassium: 4.7 meq/L (ref 3.5–5.1)
Sodium: 136 meq/L (ref 135–145)
Total Bilirubin: 0.6 mg/dL (ref 0.2–1.2)
Total Protein: 6.3 g/dL (ref 6.0–8.3)

## 2024-07-24 LAB — LIPID PANEL
Cholesterol: 245 mg/dL — ABNORMAL HIGH (ref 0–200)
HDL: 53.3 mg/dL (ref >39.00)
LDL Cholesterol: 178 mg/dL — ABNORMAL HIGH (ref 0–99)
NonHDL: 191.58
Total CHOL/HDL Ratio: 5
Triglycerides: 68 mg/dL (ref 0.0–149.0)
VLDL: 13.6 mg/dL (ref 0.0–40.0)

## 2024-07-24 LAB — VITAMIN B12: Vitamin B-12: 251 pg/mL (ref 211–911)

## 2024-07-24 LAB — HEMOGLOBIN A1C: Hgb A1c MFr Bld: 5.6 % (ref 4.6–6.5)

## 2024-07-24 NOTE — Progress Notes (Signed)
 Hold results to discuss during upcoming office visit.   Luke Shade, MD

## 2024-07-26 ENCOUNTER — Ambulatory Visit: Payer: Self-pay | Admitting: Internal Medicine

## 2024-07-28 ENCOUNTER — Ambulatory Visit

## 2024-07-28 VITALS — BP 118/84 | HR 62 | Temp 98.2°F | Ht 65.5 in | Wt 126.4 lb

## 2024-07-28 DIAGNOSIS — M8580 Other specified disorders of bone density and structure, unspecified site: Secondary | ICD-10-CM | POA: Diagnosis not present

## 2024-07-28 DIAGNOSIS — E538 Deficiency of other specified B group vitamins: Secondary | ICD-10-CM

## 2024-07-28 DIAGNOSIS — E782 Mixed hyperlipidemia: Secondary | ICD-10-CM

## 2024-07-28 DIAGNOSIS — Z0000 Encounter for general adult medical examination without abnormal findings: Secondary | ICD-10-CM

## 2024-07-28 MED ORDER — ROSUVASTATIN CALCIUM 5 MG PO TABS: 5.0000 mg | ORAL_TABLET | ORAL | 0 refills | Status: DC

## 2024-07-28 NOTE — Assessment & Plan Note (Signed)
 Mammogram up to date. Colonoscopy up to date, done on 09/04/2022 with Dr. Albertus.   Recommended repeat in 7 years.  No indication for cervical cancer screening, TAH  Immunization review shoes patient is UDT on pneumonia immunization.

## 2024-07-28 NOTE — Assessment & Plan Note (Signed)
>>  ASSESSMENT AND PLAN FOR ANNUAL PHYSICAL EXAM WRITTEN ON 07/28/2024 10:04 AM BY Kaleb Sek, MD  Mammogram up to date. Colonoscopy up to date, done on 09/04/2022 with Dr. Albertus.   Recommended repeat in 7 years.  No indication for cervical cancer screening, TAH  Immunization review shoes patient is UDT on pneumonia immunization.

## 2024-07-28 NOTE — Assessment & Plan Note (Addendum)
 Reviewed recent B12 result, goal B12 around 400. Recommend patient starts B12 supplement, OTC 1000 mcg daily.

## 2024-07-28 NOTE — Assessment & Plan Note (Addendum)
 Persistent hyperlipidemia despite weight loss. Recent lipid panel reviewed. Previous intolerance to Lipitor in the past due to generalized body aches. Discussed rosuvastatin and injectable options. Emphasized LDL cholesterol target <100. Explained rosuvastatin side effects and symptom reporting. - Started rosuvastatin 5 mg three times a week at night. - Repeat cholesterol and liver function tests in two months. - Consider injectable medications if rosuvastatin is not tolerated.  Orders:   Hepatic function panel; Future   Lipid panel; Future

## 2024-07-28 NOTE — Patient Instructions (Signed)
 Follow up in 2 months after starting Crestor 5 mg three times a week at night time to get labs rechecked. Labs includes fasting cholesterol, liver function. If you are not able to tolerate this medication, we will need to see you sooner.

## 2024-07-28 NOTE — Assessment & Plan Note (Signed)
 Mammogram up to date. Colonoscopy up to date, done on 09/04/2022 with Dr. Albertus.   Recommended repeat in 7 years.  No indication for cervical cancer screening, TAH  Immunization review shoes patient is UDT on pneumonia immunization. Adult Wellness Visit Emphasized maintaining healthy weight, preventing muscle loss and osteoporosis. Continue current exercise regimen, including walking and muscle strengthening exercises. Start daily multivitamin. Continue vitamin D  2000 units, calcium  600 mg twice a day.  Recommend welcome to medicare before 01/2025.  A1c improved based on recent lab result. CMP shows normal renal, liver function.

## 2024-07-28 NOTE — Progress Notes (Signed)
 Discussed during OV today.  Luke Shade, MD

## 2024-07-28 NOTE — Assessment & Plan Note (Signed)
 DEXA UDT.  Continue vitamin D  2000 units, calcium  600 mg twice a day.

## 2024-07-28 NOTE — Progress Notes (Signed)
 Established Patient Office Visit Annual Physical    Subjective  Patient ID: Danielle Sparks, female    DOB: 05-22-1961  Age: 63 y.o. MRN: [id number]  Chief Complaint  Patient presents with   Annual Exam    Discussed the use of AI scribe software for clinical note transcription with the patient, who gave verbal consent to proceed.  History of Present Illness Danielle Sparks is a 63 year old female with hyperlipidemia who presents for annual physical exam and discuss recent lab results.    - She has experienced significant weight loss since starting Semaglutide  for weight loss. She has lost about 20 pounds and is taking it every fifteen days, down from 20 units every week. Exact strength unknown. Since weight loss blood glucose is improved however, her LDL cholesterol continues to be high. Previously she was treated with Lipitor and did not tolerate it due to body aches.  She has not taken Zetia . Her blood pressure has improved with weight loss, and she no longer appears prediabetic. She is currently taking telmisartan  80 mg daily, Spironolactone  25 mg daily and amlodipine  5 mg daily for blood pressure management.  - DEXA: 07/09/24 Osteopenia based on BMD. Continues to take daily vitamin D  and calcium  supplement.  - Last mammogram: 07/09/2024, annual screening recommended. She has a history of breast cancer and follows up with heme/onc.  - PAP normal 2013, since having hysterectomy she has not have repeat PAP.  - Colonoscopy- Last done on 09/04/2022, repeat in 7 years recommended (Dr. Albertus).  - Follows up with psychiatrist Dr. Chipper and is on Wellbutrin 150 mg twice a day, Zoloft 150 mg daily, Lamictal  150 mg daily. Mood has been stable.  - She is considering joining a gym and getting a systems analyst to help with muscle strengthening exercises, as she is concerned about muscle loss with her weight loss. She has three-pound weights at home and is motivated to build muscle.  ROS As per  HPI    Objective:     BP 118/84 (BP Location: Left Arm, Patient Position: Sitting, Cuff Size: Small)   Pulse 62   Temp 98.2 F (36.8 C) (Oral)   Ht 5' 5.5 (1.664 m)   Wt 126 lb 6.4 oz (57.3 kg)   SpO2 95%   BMI 20.71 kg/m      07/28/2024    9:07 AM 04/23/2024   11:42 AM 11/13/2022    9:01 AM  Depression screen PHQ 2/9  Decreased Interest 0 0 0  Down, Depressed, Hopeless 0 1 0  PHQ - 2 Score 0 1 0  Altered sleeping 0 0   Tired, decreased energy 0 1   Change in appetite 0 0   Feeling bad or failure about yourself  0 0   Trouble concentrating 0 0   Moving slowly or fidgety/restless 0 0   Suicidal thoughts 0 0   PHQ-9 Score 0 2   Difficult doing work/chores Not difficult at all Not difficult at all       07/28/2024    9:07 AM 04/23/2024   11:43 AM 11/13/2022    9:01 AM 10/25/2020    8:22 AM  GAD 7 : Generalized Anxiety Score  Nervous, Anxious, on Edge 0 0 0 1  Control/stop worrying 0 0 0 0  Worry too much - different things 0 0 0 0  Trouble relaxing 0 1 0 1  Restless 0 0 0 0  Easily annoyed or irritable 0 1 0 0  Afraid - awful might happen 0 0 0 0  Total GAD 7 Score 0 2 0 2  Anxiety Difficulty Not difficult at all Not difficult at all Not difficult at all Not difficult at all      07/28/2024    9:07 AM 04/23/2024   11:42 AM 11/13/2022    9:01 AM  Depression screen PHQ 2/9  Decreased Interest 0 0 0  Down, Depressed, Hopeless 0 1 0  PHQ - 2 Score 0 1 0  Altered sleeping 0 0   Tired, decreased energy 0 1   Change in appetite 0 0   Feeling bad or failure about yourself  0 0   Trouble concentrating 0 0   Moving slowly or fidgety/restless 0 0   Suicidal thoughts 0 0   PHQ-9 Score 0 2   Difficult doing work/chores Not difficult at all Not difficult at all       07/28/2024    9:07 AM 04/23/2024   11:43 AM 11/13/2022    9:01 AM 10/25/2020    8:22 AM  GAD 7 : Generalized Anxiety Score  Nervous, Anxious, on Edge 0 0 0 1  Control/stop worrying 0 0 0 0  Worry too much  - different things 0 0 0 0  Trouble relaxing 0 1 0 1  Restless 0 0 0 0  Easily annoyed or irritable 0 1 0 0  Afraid - awful might happen 0 0 0 0  Total GAD 7 Score 0 2 0 2  Anxiety Difficulty Not difficult at all Not difficult at all Not difficult at all Not difficult at all   SDOH Screenings   Depression (PHQ2-9): Low Risk  (07/28/2024)  Tobacco Use: Low Risk  (07/28/2024)     Physical Exam Constitutional:      Appearance: Normal appearance.  HENT:     Head: Normocephalic and atraumatic.     Right Ear: Tympanic membrane normal. There is no impacted cerumen.     Left Ear: Tympanic membrane normal. There is no impacted cerumen.     Mouth/Throat:     Mouth: Mucous membranes are moist.  Neck:     Thyroid : No thyroid  mass or thyroid  tenderness.  Cardiovascular:     Rate and Rhythm: Normal rate and regular rhythm.  Pulmonary:     Effort: Pulmonary effort is normal.     Breath sounds: Normal breath sounds.  Abdominal:     General: Bowel sounds are normal.     Palpations: Abdomen is soft.     Tenderness: There is no abdominal tenderness. There is no guarding.  Musculoskeletal:     Cervical back: Neck supple. No rigidity.     Right lower leg: No edema.     Left lower leg: No edema.  Skin:    General: Skin is warm.  Neurological:     Mental Status: She is alert and oriented to person, place, and time.     Gait: Gait normal.  Psychiatric:        Mood and Affect: Mood normal.        Behavior: Behavior normal.        No results found for any visits on 07/28/24.  The 10-year ASCVD risk score (Arnett DK, et al., 2019) is: 7.2%    Following lab results discussed during today's visit:  Component     Latest Ref Rng 07/24/2024  Sodium     135 - 145 mEq/L 136   Potassium     3.5 -  5.1 mEq/L 4.7   Chloride     96 - 112 mEq/L 99   CO2     19 - 32 mEq/L 30   Glucose     70 - 99 mg/dL 76   BUN     6 - 23 mg/dL 13   Creatinine     9.59 - 1.20 mg/dL 9.11   Total  Bilirubin     0.2 - 1.2 mg/dL 0.6   Alkaline Phosphatase     39 - 117 U/L 58   AST     0 - 37 U/L 15   ALT     0 - 35 U/L 11   Total Protein     6.0 - 8.3 g/dL 6.3   Albumin     3.5 - 5.2 g/dL 4.5   GFR     >39.99 mL/min 68.97   Calcium      8.4 - 10.5 mg/dL 9.4   Cholesterol     0 - 200 mg/dL 754 (H)   Triglycerides     0.0 - 149.0 mg/dL 31.9   HDL Cholesterol     >39.00 mg/dL 46.69   VLDL     0.0 - 40.0 mg/dL 86.3   LDL (calc)     0 - 99 mg/dL 821 (H)   Total CHOL/HDL Ratio 5   NonHDL 191.58   Hemoglobin A1C     4.6 - 6.5 % 5.6   Vitamin B12     211 - 911 pg/mL 251      Assessment & Plan:   Assessment & Plan Encounter for annual physical exam Mammogram up to date. Colonoscopy up to date, done on 09/04/2022 with Dr. Albertus.   Recommended repeat in 7 years.  No indication for cervical cancer screening, TAH  Immunization review shoes patient is UDT on pneumonia immunization. Adult Wellness Visit Emphasized maintaining healthy weight, preventing muscle loss and osteoporosis. Continue current exercise regimen, including walking and muscle strengthening exercises. Start daily multivitamin. Continue vitamin D  2000 units, calcium  600 mg twice a day.  Recommend welcome to medicare before 01/2025.  A1c improved based on recent lab result. CMP shows normal renal, liver function.     Mixed hyperlipidemia Persistent hyperlipidemia despite weight loss. Recent lipid panel reviewed. Previous intolerance to Lipitor in the past due to generalized body aches. Discussed rosuvastatin and injectable options. Emphasized LDL cholesterol target <100. Explained rosuvastatin side effects and symptom reporting. - Started rosuvastatin 5 mg three times a week at night. - Repeat cholesterol and liver function tests in two months. - Consider injectable medications if rosuvastatin is not tolerated.  Orders:   Hepatic function panel; Future   Lipid panel; Future  Osteopenia, unspecified  location DEXA UDT.  Continue vitamin D  2000 units, calcium  600 mg twice a day.     B12 deficiency Reviewed recent B12 result, goal B12 around 400. Recommend patient starts B12 supplement, OTC 1000 mcg daily.       Return for Welcome to medicare with Dr. Abbey before May 2026, fasting labs in 2 months .   Luke Abbey, MD

## 2024-08-10 ENCOUNTER — Encounter

## 2024-08-15 ENCOUNTER — Other Ambulatory Visit: Payer: Self-pay

## 2024-08-15 DIAGNOSIS — I10 Essential (primary) hypertension: Secondary | ICD-10-CM

## 2024-09-07 ENCOUNTER — Other Ambulatory Visit: Payer: Self-pay

## 2024-09-07 MED ORDER — SERTRALINE HCL 100 MG PO TABS
200.0000 mg | ORAL_TABLET | Freq: Every day | ORAL | 0 refills | Status: AC
  Filled 2024-09-07 – 2024-10-29 (×3): qty 180, 90d supply, fill #0

## 2024-09-07 MED ORDER — BUPROPION HCL 75 MG PO TABS
75.0000 mg | ORAL_TABLET | Freq: Two times a day (BID) | ORAL | 0 refills | Status: AC
  Filled 2024-09-07: qty 180, 90d supply, fill #0
  Filled 2024-10-13: qty 360, 180d supply, fill #0

## 2024-09-07 MED ORDER — LAMOTRIGINE 150 MG PO TABS
150.0000 mg | ORAL_TABLET | Freq: Every morning | ORAL | 0 refills | Status: AC
  Filled 2024-09-07 – 2024-10-13 (×2): qty 90, 90d supply, fill #0

## 2024-09-08 ENCOUNTER — Other Ambulatory Visit: Payer: Self-pay

## 2024-09-13 ENCOUNTER — Other Ambulatory Visit: Payer: Self-pay

## 2024-09-13 DIAGNOSIS — I10 Essential (primary) hypertension: Secondary | ICD-10-CM

## 2024-09-17 ENCOUNTER — Other Ambulatory Visit: Payer: Self-pay

## 2024-09-29 ENCOUNTER — Other Ambulatory Visit (INDEPENDENT_AMBULATORY_CARE_PROVIDER_SITE_OTHER)

## 2024-09-29 ENCOUNTER — Other Ambulatory Visit: Payer: Self-pay

## 2024-09-29 ENCOUNTER — Ambulatory Visit: Payer: Self-pay

## 2024-09-29 DIAGNOSIS — E782 Mixed hyperlipidemia: Secondary | ICD-10-CM | POA: Diagnosis not present

## 2024-09-29 DIAGNOSIS — I10 Essential (primary) hypertension: Secondary | ICD-10-CM

## 2024-09-29 DIAGNOSIS — R748 Abnormal levels of other serum enzymes: Secondary | ICD-10-CM | POA: Insufficient documentation

## 2024-09-29 LAB — HEPATIC FUNCTION PANEL
ALT: 73 U/L — ABNORMAL HIGH (ref 3–35)
AST: 44 U/L — ABNORMAL HIGH (ref 5–37)
Albumin: 4.4 g/dL (ref 3.5–5.2)
Alkaline Phosphatase: 81 U/L (ref 39–117)
Bilirubin, Direct: 0.1 mg/dL (ref 0.1–0.3)
Total Bilirubin: 0.5 mg/dL (ref 0.2–1.2)
Total Protein: 6.3 g/dL (ref 6.0–8.3)

## 2024-09-29 LAB — LIPID PANEL
Cholesterol: 182 mg/dL (ref 28–200)
HDL: 57.1 mg/dL
LDL Cholesterol: 113 mg/dL — ABNORMAL HIGH (ref 10–99)
NonHDL: 124.57
Total CHOL/HDL Ratio: 3
Triglycerides: 57 mg/dL (ref 10.0–149.0)
VLDL: 11.4 mg/dL (ref 0.0–40.0)

## 2024-10-13 ENCOUNTER — Other Ambulatory Visit (HOSPITAL_COMMUNITY): Payer: Self-pay

## 2024-10-13 ENCOUNTER — Other Ambulatory Visit

## 2024-10-13 ENCOUNTER — Ambulatory Visit: Payer: Self-pay

## 2024-10-13 DIAGNOSIS — R748 Abnormal levels of other serum enzymes: Secondary | ICD-10-CM

## 2024-10-13 DIAGNOSIS — E782 Mixed hyperlipidemia: Secondary | ICD-10-CM

## 2024-10-13 DIAGNOSIS — T466X5A Adverse effect of antihyperlipidemic and antiarteriosclerotic drugs, initial encounter: Secondary | ICD-10-CM

## 2024-10-13 LAB — HEPATIC FUNCTION PANEL
ALT: 29 U/L (ref 3–35)
AST: 22 U/L (ref 5–37)
Albumin: 4.5 g/dL (ref 3.5–5.2)
Alkaline Phosphatase: 62 U/L (ref 39–117)
Bilirubin, Direct: 0.1 mg/dL (ref 0.1–0.3)
Total Bilirubin: 0.5 mg/dL (ref 0.2–1.2)
Total Protein: 6.3 g/dL (ref 6.0–8.3)

## 2024-10-14 DIAGNOSIS — T466X5A Adverse effect of antihyperlipidemic and antiarteriosclerotic drugs, initial encounter: Secondary | ICD-10-CM | POA: Insufficient documentation

## 2024-10-15 ENCOUNTER — Other Ambulatory Visit: Payer: Self-pay

## 2024-10-15 MED ORDER — REPATHA SURECLICK 140 MG/ML ~~LOC~~ SOAJ
140.0000 mg | SUBCUTANEOUS | 1 refills | Status: AC
  Filled 2024-10-15: qty 2, 28d supply, fill #0

## 2024-10-15 NOTE — Telephone Encounter (Signed)
 1. Mixed hyperlipidemia (Primary) - Evolocumab  (REPATHA  SURECLICK) 140 MG/ML SOAJ; Inject 140 mg into the skin every 14 (fourteen) days.  Dispense: 6 mL; Refill: 1  2. Adverse reaction to statin medication - Patient with history of elevation in LFTs with multiple statin medications.  LFTs improved and back to baseline with stopping statin including Crestor .   - Evolocumab  (REPATHA  SURECLICK) 140 MG/ML SOAJ; Inject 140 mg into the skin every 14 (fourteen) days.  Dispense: 6 mL; Refill: 1 Prescription sent to [facility] pharmacy. Consider referral to Dr. Mona if unable to tolerate Repatha . F/U in March Luke Shade, MD

## 2024-10-16 ENCOUNTER — Other Ambulatory Visit (HOSPITAL_COMMUNITY): Payer: Self-pay

## 2024-10-28 NOTE — Addendum Note (Signed)
 Addended by: Georges Victorio on: 10/28/2024 12:38 PM   Modules accepted: Orders

## 2024-10-29 ENCOUNTER — Other Ambulatory Visit (HOSPITAL_COMMUNITY): Payer: Self-pay

## 2024-10-30 ENCOUNTER — Other Ambulatory Visit: Payer: Self-pay

## 2024-10-30 NOTE — Addendum Note (Signed)
 Addended by: Aamira Bischoff on: 10/30/2024 12:57 PM   Modules accepted: Orders

## 2024-11-11 ENCOUNTER — Ambulatory Visit: Payer: 59 | Admitting: Internal Medicine

## 2024-11-11 ENCOUNTER — Other Ambulatory Visit: Payer: 59

## 2024-11-17 ENCOUNTER — Inpatient Hospital Stay: Payer: Self-pay | Admitting: Internal Medicine

## 2024-11-17 ENCOUNTER — Inpatient Hospital Stay: Payer: Self-pay

## 2024-11-24 ENCOUNTER — Encounter
